# Patient Record
Sex: Female | Born: 1955
Health system: Southern US, Community
[De-identification: ages and names within clinical notes are randomized; demographics above are authoritative.]

## PROBLEM LIST (undated history)

## (undated) DIAGNOSIS — G2581 Restless legs syndrome: Secondary | ICD-10-CM

## (undated) DIAGNOSIS — J439 Emphysema, unspecified: Secondary | ICD-10-CM

## (undated) DIAGNOSIS — F32A Depression, unspecified: Secondary | ICD-10-CM

## (undated) DIAGNOSIS — F429 Obsessive-compulsive disorder, unspecified: Secondary | ICD-10-CM

## (undated) DIAGNOSIS — K219 Gastro-esophageal reflux disease without esophagitis: Secondary | ICD-10-CM

## (undated) DIAGNOSIS — G4730 Sleep apnea, unspecified: Secondary | ICD-10-CM

## (undated) DIAGNOSIS — M1990 Unspecified osteoarthritis, unspecified site: Secondary | ICD-10-CM

## (undated) DIAGNOSIS — H9190 Unspecified hearing loss, unspecified ear: Secondary | ICD-10-CM

## (undated) DIAGNOSIS — J449 Chronic obstructive pulmonary disease, unspecified: Secondary | ICD-10-CM

## (undated) DIAGNOSIS — F419 Anxiety disorder, unspecified: Secondary | ICD-10-CM

## (undated) DIAGNOSIS — T7840XA Allergy, unspecified, initial encounter: Secondary | ICD-10-CM

## (undated) HISTORY — PX: ABDOMINAL HYSTERECTOMY: SHX81

## (undated) HISTORY — PX: BREAST LUMPECTOMY: SHX2

## (undated) HISTORY — DX: Allergy, unspecified, initial encounter: T78.40XA

## (undated) HISTORY — PX: JOINT REPLACEMENT: SHX530

## (undated) HISTORY — DX: Gastro-esophageal reflux disease without esophagitis: K21.9

## (undated) HISTORY — PX: FRACTURE SURGERY: SHX138

## (undated) HISTORY — DX: Unspecified osteoarthritis, unspecified site: M19.90

## (undated) HISTORY — DX: Unspecified hearing loss, unspecified ear: H91.90

## (undated) HISTORY — DX: Sleep apnea, unspecified: G47.30

## (undated) HISTORY — DX: Emphysema, unspecified: J43.9

---

## 1978-08-23 HISTORY — PX: BREAST EXCISIONAL BIOPSY: SUR124

## 2019-09-13 DIAGNOSIS — M549 Dorsalgia, unspecified: Secondary | ICD-10-CM | POA: Diagnosis not present

## 2019-09-13 DIAGNOSIS — M545 Low back pain: Secondary | ICD-10-CM | POA: Diagnosis not present

## 2019-09-18 DIAGNOSIS — G4733 Obstructive sleep apnea (adult) (pediatric): Secondary | ICD-10-CM | POA: Diagnosis not present

## 2019-10-17 DIAGNOSIS — G4733 Obstructive sleep apnea (adult) (pediatric): Secondary | ICD-10-CM | POA: Diagnosis not present

## 2019-10-28 DIAGNOSIS — Z23 Encounter for immunization: Secondary | ICD-10-CM | POA: Diagnosis not present

## 2019-11-18 DIAGNOSIS — Z23 Encounter for immunization: Secondary | ICD-10-CM | POA: Diagnosis not present

## 2020-03-25 DIAGNOSIS — M25561 Pain in right knee: Secondary | ICD-10-CM | POA: Diagnosis not present

## 2020-03-25 DIAGNOSIS — S83411A Sprain of medial collateral ligament of right knee, initial encounter: Secondary | ICD-10-CM | POA: Diagnosis not present

## 2020-04-18 DIAGNOSIS — J449 Chronic obstructive pulmonary disease, unspecified: Secondary | ICD-10-CM | POA: Diagnosis not present

## 2020-04-18 DIAGNOSIS — D649 Anemia, unspecified: Secondary | ICD-10-CM | POA: Diagnosis not present

## 2020-04-18 DIAGNOSIS — G2581 Restless legs syndrome: Secondary | ICD-10-CM | POA: Diagnosis not present

## 2020-04-18 DIAGNOSIS — G4733 Obstructive sleep apnea (adult) (pediatric): Secondary | ICD-10-CM | POA: Diagnosis not present

## 2020-04-18 DIAGNOSIS — R0602 Shortness of breath: Secondary | ICD-10-CM | POA: Diagnosis not present

## 2020-04-25 DIAGNOSIS — J449 Chronic obstructive pulmonary disease, unspecified: Secondary | ICD-10-CM | POA: Diagnosis not present

## 2020-04-25 DIAGNOSIS — Z87891 Personal history of nicotine dependence: Secondary | ICD-10-CM | POA: Diagnosis not present

## 2020-04-25 DIAGNOSIS — R918 Other nonspecific abnormal finding of lung field: Secondary | ICD-10-CM | POA: Diagnosis not present

## 2020-04-25 DIAGNOSIS — Z122 Encounter for screening for malignant neoplasm of respiratory organs: Secondary | ICD-10-CM | POA: Diagnosis not present

## 2020-05-28 DIAGNOSIS — S83411D Sprain of medial collateral ligament of right knee, subsequent encounter: Secondary | ICD-10-CM | POA: Diagnosis not present

## 2020-05-30 DIAGNOSIS — F17200 Nicotine dependence, unspecified, uncomplicated: Secondary | ICD-10-CM | POA: Diagnosis not present

## 2020-05-30 DIAGNOSIS — R911 Solitary pulmonary nodule: Secondary | ICD-10-CM | POA: Diagnosis not present

## 2020-05-30 DIAGNOSIS — G4733 Obstructive sleep apnea (adult) (pediatric): Secondary | ICD-10-CM | POA: Diagnosis not present

## 2020-05-30 DIAGNOSIS — Z23 Encounter for immunization: Secondary | ICD-10-CM | POA: Diagnosis not present

## 2020-05-30 DIAGNOSIS — J449 Chronic obstructive pulmonary disease, unspecified: Secondary | ICD-10-CM | POA: Diagnosis not present

## 2020-06-23 DIAGNOSIS — M25511 Pain in right shoulder: Secondary | ICD-10-CM | POA: Diagnosis not present

## 2020-06-23 DIAGNOSIS — M19011 Primary osteoarthritis, right shoulder: Secondary | ICD-10-CM | POA: Diagnosis not present

## 2020-06-23 DIAGNOSIS — M25512 Pain in left shoulder: Secondary | ICD-10-CM | POA: Diagnosis not present

## 2020-06-23 DIAGNOSIS — M19012 Primary osteoarthritis, left shoulder: Secondary | ICD-10-CM | POA: Diagnosis not present

## 2020-06-25 DIAGNOSIS — F419 Anxiety disorder, unspecified: Secondary | ICD-10-CM | POA: Diagnosis not present

## 2020-06-25 DIAGNOSIS — G2581 Restless legs syndrome: Secondary | ICD-10-CM | POA: Diagnosis not present

## 2020-06-25 DIAGNOSIS — Z23 Encounter for immunization: Secondary | ICD-10-CM | POA: Diagnosis not present

## 2020-06-25 DIAGNOSIS — E538 Deficiency of other specified B group vitamins: Secondary | ICD-10-CM | POA: Diagnosis not present

## 2020-06-25 DIAGNOSIS — E785 Hyperlipidemia, unspecified: Secondary | ICD-10-CM | POA: Diagnosis not present

## 2020-07-10 DIAGNOSIS — E538 Deficiency of other specified B group vitamins: Secondary | ICD-10-CM | POA: Diagnosis not present

## 2020-07-22 DIAGNOSIS — M19012 Primary osteoarthritis, left shoulder: Secondary | ICD-10-CM | POA: Diagnosis not present

## 2020-07-22 DIAGNOSIS — G8918 Other acute postprocedural pain: Secondary | ICD-10-CM | POA: Diagnosis not present

## 2020-07-31 DIAGNOSIS — R509 Fever, unspecified: Secondary | ICD-10-CM | POA: Diagnosis not present

## 2020-07-31 DIAGNOSIS — J441 Chronic obstructive pulmonary disease with (acute) exacerbation: Secondary | ICD-10-CM | POA: Diagnosis not present

## 2020-07-31 DIAGNOSIS — R0981 Nasal congestion: Secondary | ICD-10-CM | POA: Diagnosis not present

## 2020-08-06 DIAGNOSIS — M19012 Primary osteoarthritis, left shoulder: Secondary | ICD-10-CM | POA: Diagnosis not present

## 2020-08-08 DIAGNOSIS — J069 Acute upper respiratory infection, unspecified: Secondary | ICD-10-CM | POA: Diagnosis not present

## 2020-08-11 DIAGNOSIS — J329 Chronic sinusitis, unspecified: Secondary | ICD-10-CM | POA: Diagnosis not present

## 2020-09-04 DIAGNOSIS — Z1231 Encounter for screening mammogram for malignant neoplasm of breast: Secondary | ICD-10-CM | POA: Diagnosis not present

## 2020-09-10 DIAGNOSIS — M25512 Pain in left shoulder: Secondary | ICD-10-CM | POA: Diagnosis not present

## 2020-09-10 DIAGNOSIS — M5451 Vertebrogenic low back pain: Secondary | ICD-10-CM | POA: Diagnosis not present

## 2020-09-10 DIAGNOSIS — M25612 Stiffness of left shoulder, not elsewhere classified: Secondary | ICD-10-CM | POA: Diagnosis not present

## 2020-09-10 DIAGNOSIS — M9903 Segmental and somatic dysfunction of lumbar region: Secondary | ICD-10-CM | POA: Diagnosis not present

## 2020-09-10 DIAGNOSIS — M19012 Primary osteoarthritis, left shoulder: Secondary | ICD-10-CM | POA: Diagnosis not present

## 2020-09-10 DIAGNOSIS — M62830 Muscle spasm of back: Secondary | ICD-10-CM | POA: Diagnosis not present

## 2020-09-10 DIAGNOSIS — M5432 Sciatica, left side: Secondary | ICD-10-CM | POA: Diagnosis not present

## 2020-09-11 DIAGNOSIS — M5432 Sciatica, left side: Secondary | ICD-10-CM | POA: Diagnosis not present

## 2020-09-11 DIAGNOSIS — M9903 Segmental and somatic dysfunction of lumbar region: Secondary | ICD-10-CM | POA: Diagnosis not present

## 2020-09-11 DIAGNOSIS — M5451 Vertebrogenic low back pain: Secondary | ICD-10-CM | POA: Diagnosis not present

## 2020-09-11 DIAGNOSIS — M62830 Muscle spasm of back: Secondary | ICD-10-CM | POA: Diagnosis not present

## 2020-09-12 DIAGNOSIS — M25612 Stiffness of left shoulder, not elsewhere classified: Secondary | ICD-10-CM | POA: Diagnosis not present

## 2020-09-12 DIAGNOSIS — M25512 Pain in left shoulder: Secondary | ICD-10-CM | POA: Diagnosis not present

## 2020-09-12 DIAGNOSIS — M19012 Primary osteoarthritis, left shoulder: Secondary | ICD-10-CM | POA: Diagnosis not present

## 2020-09-15 DIAGNOSIS — M545 Low back pain, unspecified: Secondary | ICD-10-CM | POA: Diagnosis not present

## 2020-09-18 DIAGNOSIS — M25612 Stiffness of left shoulder, not elsewhere classified: Secondary | ICD-10-CM | POA: Diagnosis not present

## 2020-09-18 DIAGNOSIS — M19012 Primary osteoarthritis, left shoulder: Secondary | ICD-10-CM | POA: Diagnosis not present

## 2020-09-18 DIAGNOSIS — M25512 Pain in left shoulder: Secondary | ICD-10-CM | POA: Diagnosis not present

## 2020-09-19 DIAGNOSIS — F17200 Nicotine dependence, unspecified, uncomplicated: Secondary | ICD-10-CM | POA: Diagnosis not present

## 2020-09-19 DIAGNOSIS — G4733 Obstructive sleep apnea (adult) (pediatric): Secondary | ICD-10-CM | POA: Diagnosis not present

## 2020-09-19 DIAGNOSIS — R918 Other nonspecific abnormal finding of lung field: Secondary | ICD-10-CM | POA: Diagnosis not present

## 2020-09-19 DIAGNOSIS — J449 Chronic obstructive pulmonary disease, unspecified: Secondary | ICD-10-CM | POA: Diagnosis not present

## 2020-09-19 DIAGNOSIS — R911 Solitary pulmonary nodule: Secondary | ICD-10-CM | POA: Diagnosis not present

## 2020-09-23 DIAGNOSIS — M25612 Stiffness of left shoulder, not elsewhere classified: Secondary | ICD-10-CM | POA: Diagnosis not present

## 2020-09-23 DIAGNOSIS — M25512 Pain in left shoulder: Secondary | ICD-10-CM | POA: Diagnosis not present

## 2020-09-23 DIAGNOSIS — M19012 Primary osteoarthritis, left shoulder: Secondary | ICD-10-CM | POA: Diagnosis not present

## 2020-09-30 DIAGNOSIS — M25612 Stiffness of left shoulder, not elsewhere classified: Secondary | ICD-10-CM | POA: Diagnosis not present

## 2020-09-30 DIAGNOSIS — M25512 Pain in left shoulder: Secondary | ICD-10-CM | POA: Diagnosis not present

## 2020-10-02 DIAGNOSIS — J449 Chronic obstructive pulmonary disease, unspecified: Secondary | ICD-10-CM | POA: Diagnosis not present

## 2020-10-02 DIAGNOSIS — G2581 Restless legs syndrome: Secondary | ICD-10-CM | POA: Diagnosis not present

## 2020-10-02 DIAGNOSIS — E611 Iron deficiency: Secondary | ICD-10-CM | POA: Diagnosis not present

## 2020-10-02 DIAGNOSIS — Z87891 Personal history of nicotine dependence: Secondary | ICD-10-CM | POA: Diagnosis not present

## 2020-10-07 DIAGNOSIS — M25512 Pain in left shoulder: Secondary | ICD-10-CM | POA: Diagnosis not present

## 2020-10-07 DIAGNOSIS — M25612 Stiffness of left shoulder, not elsewhere classified: Secondary | ICD-10-CM | POA: Diagnosis not present

## 2020-10-07 DIAGNOSIS — M19012 Primary osteoarthritis, left shoulder: Secondary | ICD-10-CM | POA: Diagnosis not present

## 2020-10-14 DIAGNOSIS — M19012 Primary osteoarthritis, left shoulder: Secondary | ICD-10-CM | POA: Diagnosis not present

## 2020-10-14 DIAGNOSIS — M25512 Pain in left shoulder: Secondary | ICD-10-CM | POA: Diagnosis not present

## 2020-10-14 DIAGNOSIS — M25612 Stiffness of left shoulder, not elsewhere classified: Secondary | ICD-10-CM | POA: Diagnosis not present

## 2020-10-16 DIAGNOSIS — M25612 Stiffness of left shoulder, not elsewhere classified: Secondary | ICD-10-CM | POA: Diagnosis not present

## 2020-10-16 DIAGNOSIS — M25512 Pain in left shoulder: Secondary | ICD-10-CM | POA: Diagnosis not present

## 2020-10-31 DIAGNOSIS — M19019 Primary osteoarthritis, unspecified shoulder: Secondary | ICD-10-CM | POA: Diagnosis not present

## 2020-10-31 DIAGNOSIS — M19012 Primary osteoarthritis, left shoulder: Secondary | ICD-10-CM | POA: Diagnosis not present

## 2020-11-26 ENCOUNTER — Other Ambulatory Visit: Payer: Self-pay

## 2020-11-26 DIAGNOSIS — R0781 Pleurodynia: Secondary | ICD-10-CM | POA: Diagnosis not present

## 2020-11-26 DIAGNOSIS — S50311A Abrasion of right elbow, initial encounter: Secondary | ICD-10-CM | POA: Insufficient documentation

## 2020-11-26 DIAGNOSIS — Y92019 Unspecified place in single-family (private) house as the place of occurrence of the external cause: Secondary | ICD-10-CM | POA: Diagnosis not present

## 2020-11-26 DIAGNOSIS — J449 Chronic obstructive pulmonary disease, unspecified: Secondary | ICD-10-CM | POA: Insufficient documentation

## 2020-11-26 DIAGNOSIS — Z96612 Presence of left artificial shoulder joint: Secondary | ICD-10-CM | POA: Diagnosis not present

## 2020-11-26 DIAGNOSIS — W108XXA Fall (on) (from) other stairs and steps, initial encounter: Secondary | ICD-10-CM | POA: Insufficient documentation

## 2020-11-26 DIAGNOSIS — M25561 Pain in right knee: Secondary | ICD-10-CM | POA: Diagnosis not present

## 2020-11-26 DIAGNOSIS — Z23 Encounter for immunization: Secondary | ICD-10-CM | POA: Insufficient documentation

## 2020-11-26 DIAGNOSIS — S299XXA Unspecified injury of thorax, initial encounter: Secondary | ICD-10-CM | POA: Diagnosis not present

## 2020-11-26 DIAGNOSIS — S20211A Contusion of right front wall of thorax, initial encounter: Secondary | ICD-10-CM | POA: Diagnosis not present

## 2020-11-26 DIAGNOSIS — S5001XA Contusion of right elbow, initial encounter: Secondary | ICD-10-CM | POA: Diagnosis not present

## 2020-11-26 DIAGNOSIS — M25521 Pain in right elbow: Secondary | ICD-10-CM | POA: Diagnosis not present

## 2020-11-27 ENCOUNTER — Emergency Department (HOSPITAL_COMMUNITY): Payer: BC Managed Care – PPO

## 2020-11-27 ENCOUNTER — Other Ambulatory Visit: Payer: Self-pay

## 2020-11-27 ENCOUNTER — Emergency Department (HOSPITAL_COMMUNITY)
Admission: EM | Admit: 2020-11-27 | Discharge: 2020-11-27 | Disposition: A | Discharge status: Home / Self Care | Payer: BC Managed Care – PPO | Attending: Emergency Medicine | Admitting: Emergency Medicine

## 2020-11-27 ENCOUNTER — Encounter (HOSPITAL_COMMUNITY): Payer: Self-pay | Admitting: Emergency Medicine

## 2020-11-27 DIAGNOSIS — S20211A Contusion of right front wall of thorax, initial encounter: Secondary | ICD-10-CM | POA: Diagnosis not present

## 2020-11-27 DIAGNOSIS — R0781 Pleurodynia: Secondary | ICD-10-CM | POA: Diagnosis not present

## 2020-11-27 DIAGNOSIS — M25521 Pain in right elbow: Secondary | ICD-10-CM | POA: Diagnosis not present

## 2020-11-27 DIAGNOSIS — S50311A Abrasion of right elbow, initial encounter: Secondary | ICD-10-CM | POA: Diagnosis not present

## 2020-11-27 DIAGNOSIS — S5001XA Contusion of right elbow, initial encounter: Secondary | ICD-10-CM | POA: Diagnosis not present

## 2020-11-27 DIAGNOSIS — W108XXA Fall (on) (from) other stairs and steps, initial encounter: Secondary | ICD-10-CM

## 2020-11-27 HISTORY — DX: Obsessive-compulsive disorder, unspecified: F42.9

## 2020-11-27 HISTORY — DX: Chronic obstructive pulmonary disease, unspecified: J44.9

## 2020-11-27 HISTORY — DX: Restless legs syndrome: G25.81

## 2020-11-27 IMAGING — DX DG ELBOW COMPLETE 3+V*R*
4 series · 4 of 4 positions shown · non-contrast
Comparison: None.

CLINICAL DATA: Fall, right elbow pain

EXAM:
RIGHT ELBOW - COMPLETE 3+ VIEW

[elbow ap]
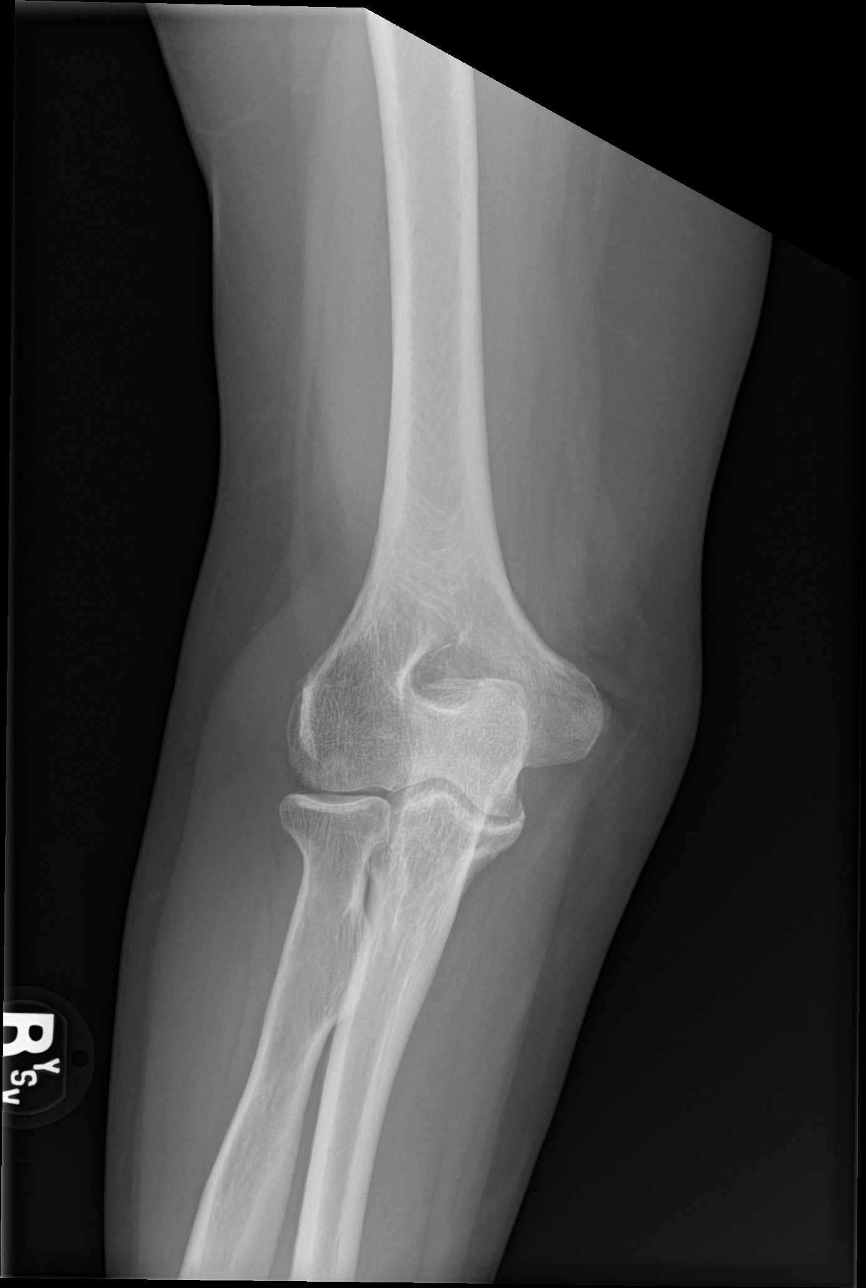

[elbow obl (1 of 2)]
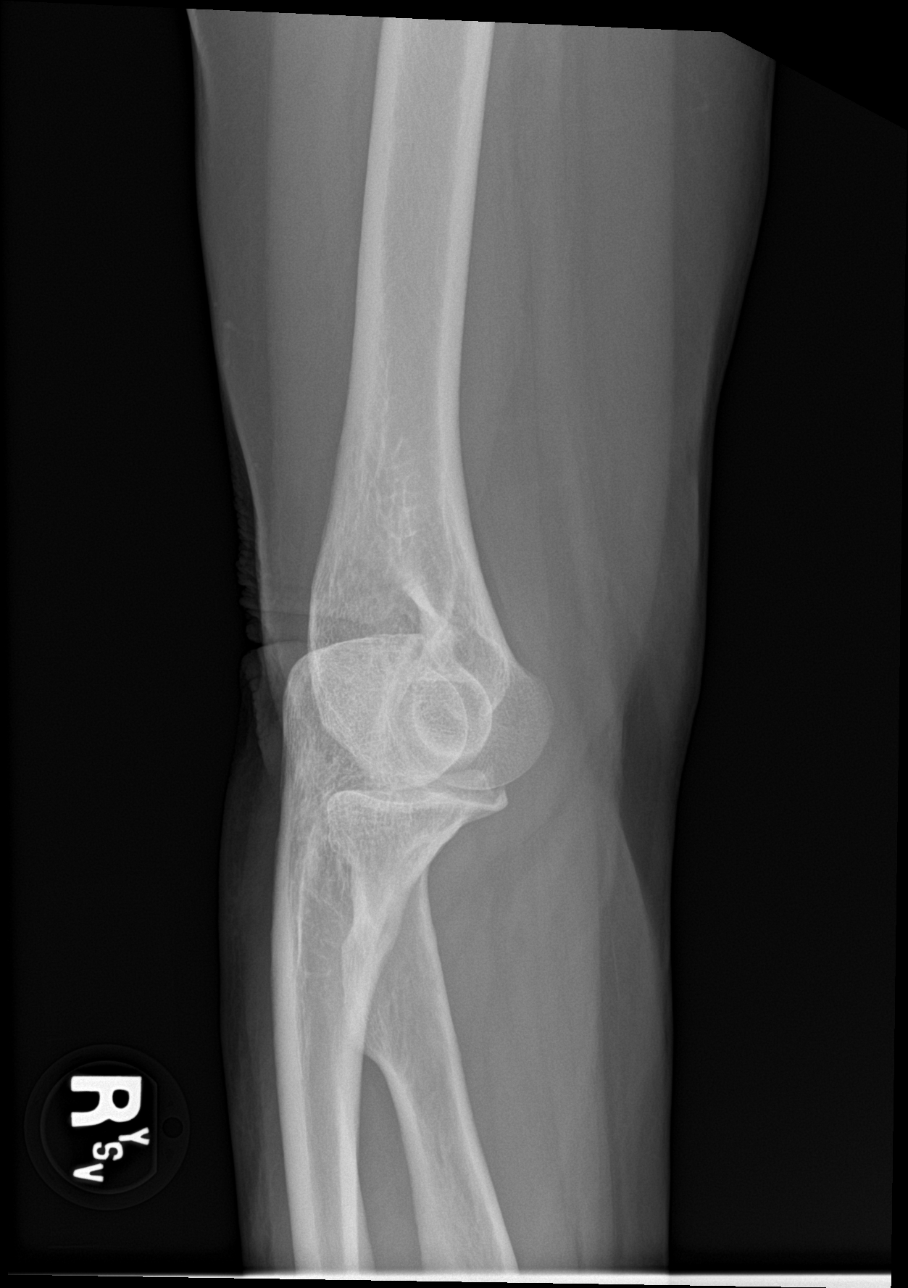

[elbow obl (2 of 2)]
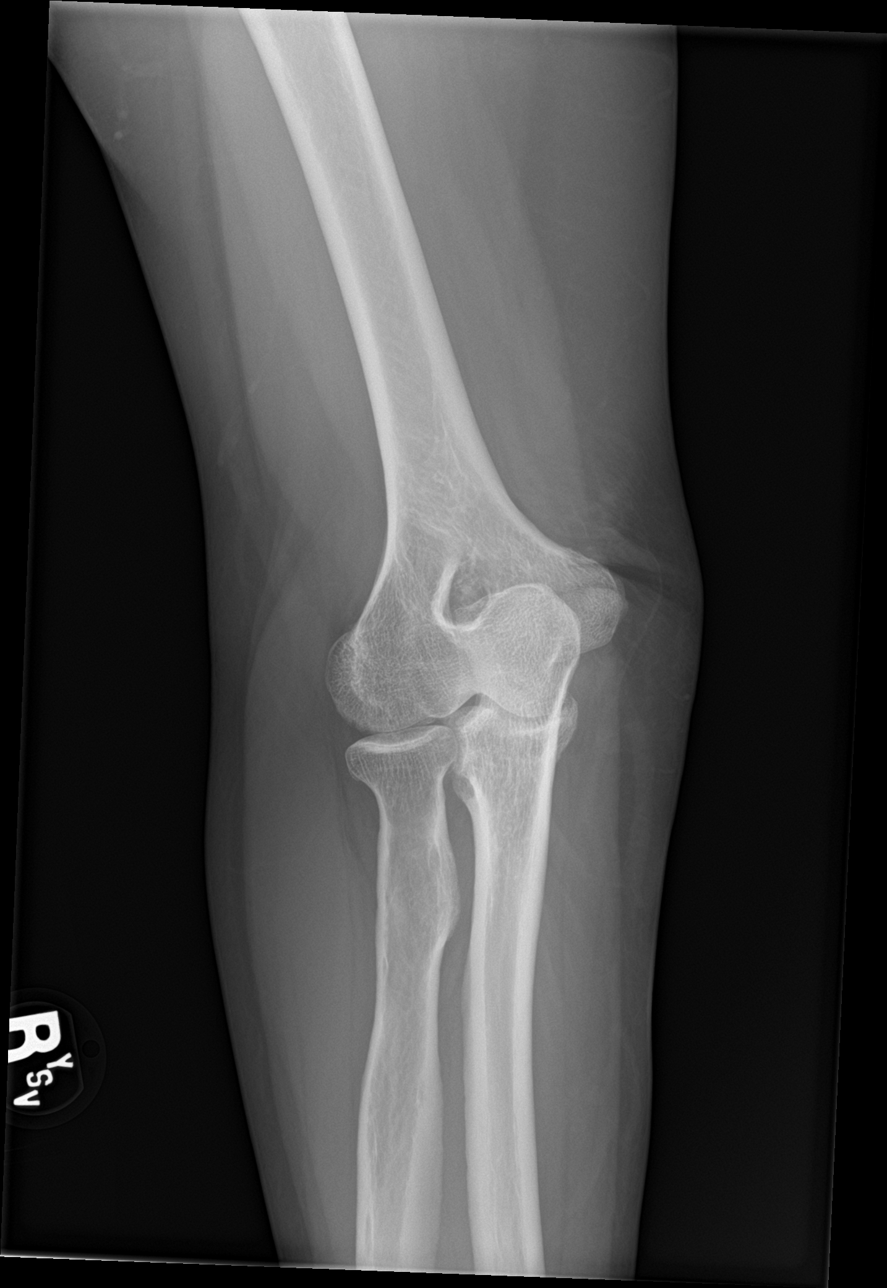

[elbow lat]
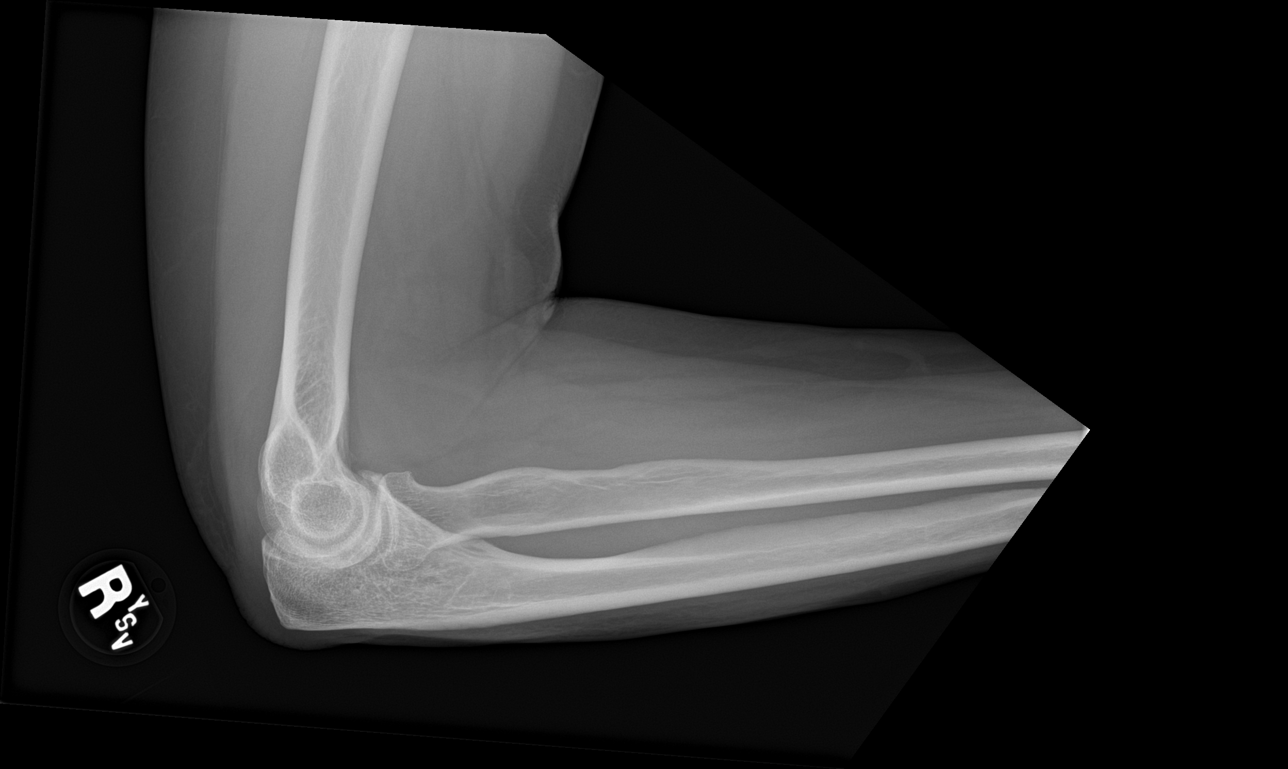

[4 of 4 positions shown; findings below may reference images not displayed]

FINDINGS: There is no evidence of fracture, dislocation, or joint effusion.
There is no evidence of arthropathy or other focal bone abnormality.
Soft tissues are unremarkable.
IMPRESSION: Negative.

## 2020-11-27 IMAGING — DX DG RIBS W/ CHEST 3+V*R*
5 series · 5 of 5 positions shown · non-contrast
Comparison: None.

CLINICAL DATA: Fall down stairs.  Right rib pain

EXAM:
RIGHT RIBS AND CHEST - 3+ VIEW

[chest pa]
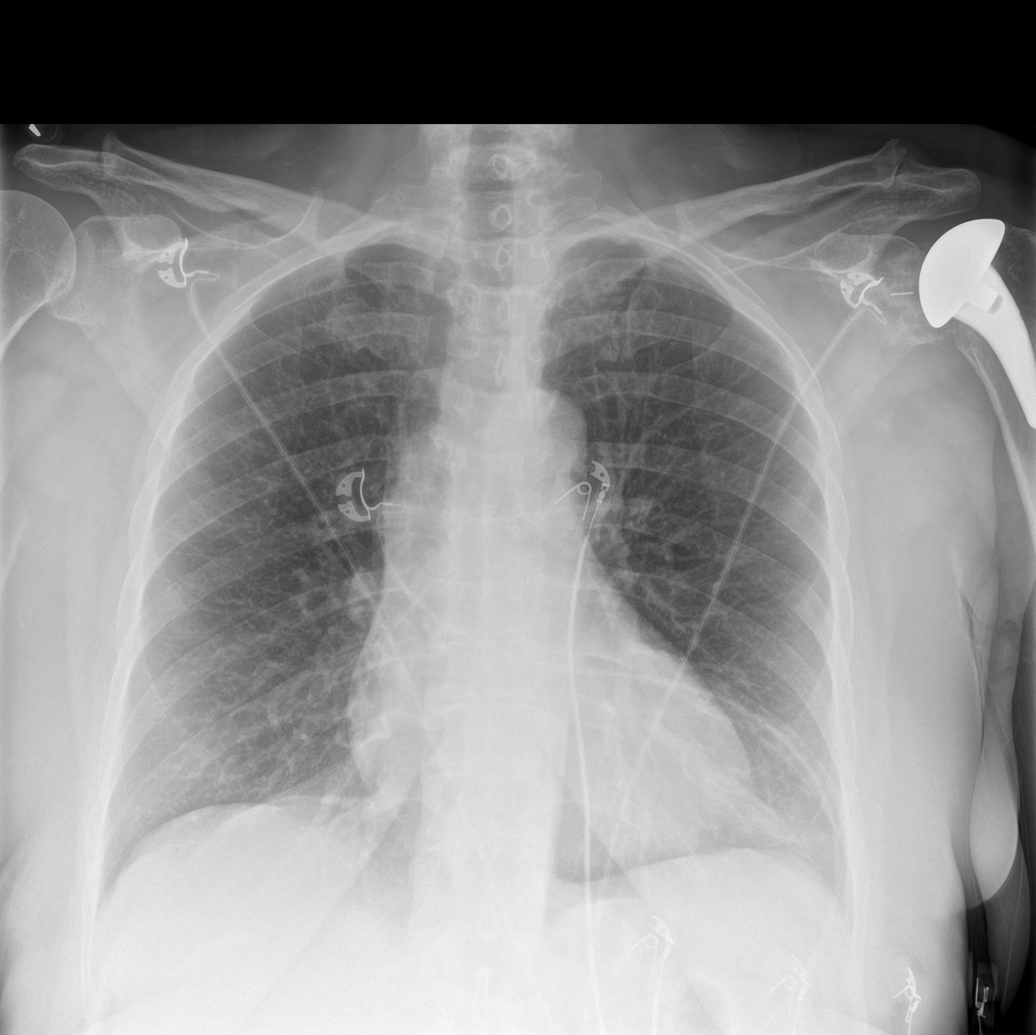

[rib pa]
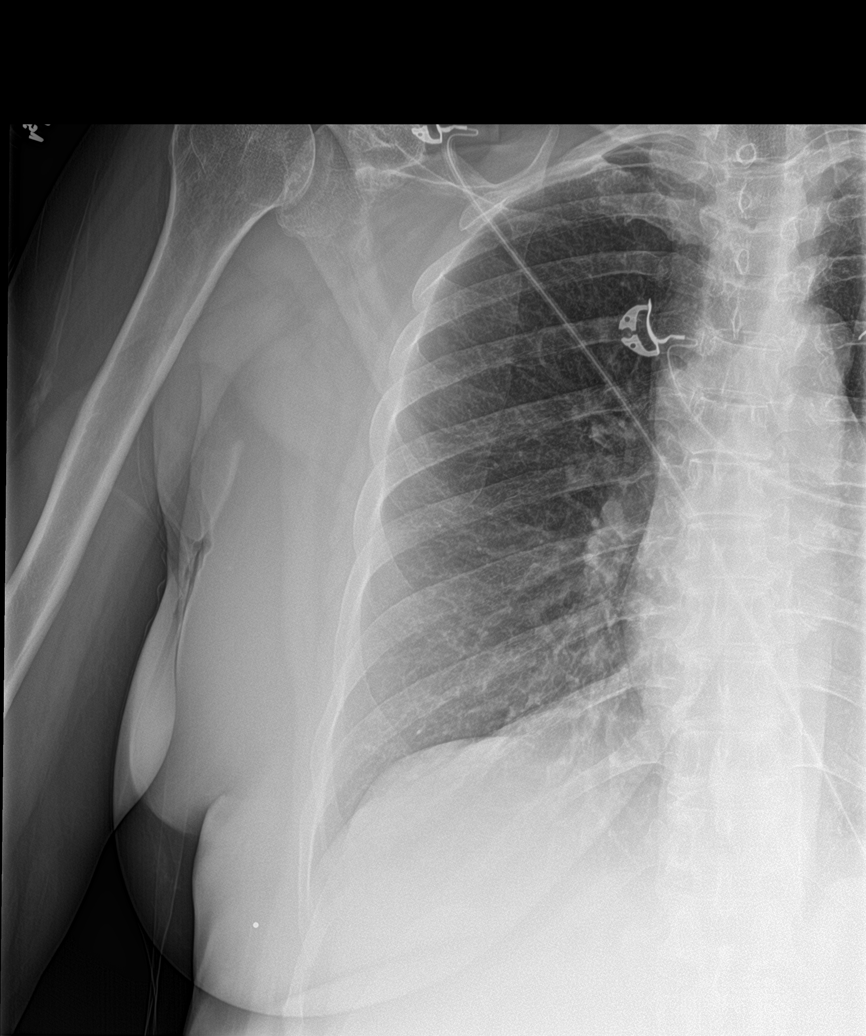

[rib pa obl (1 of 3)]
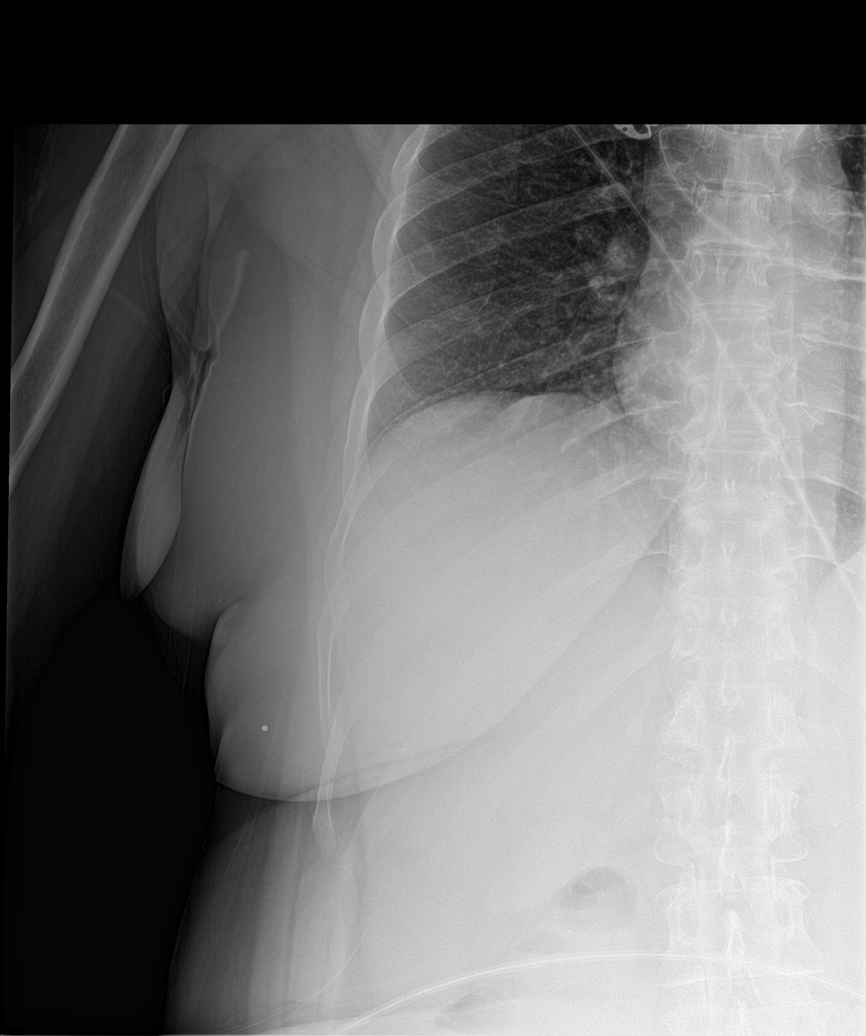

[rib pa obl (2 of 3)]
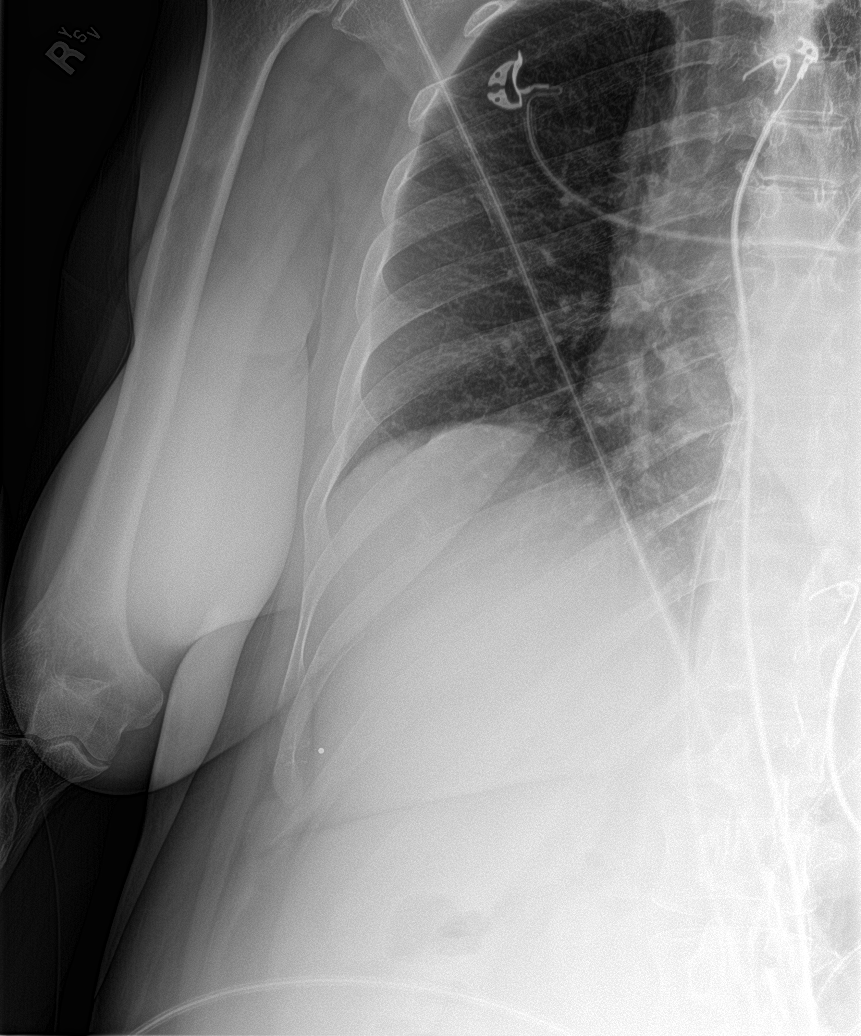

[rib pa obl (3 of 3)]
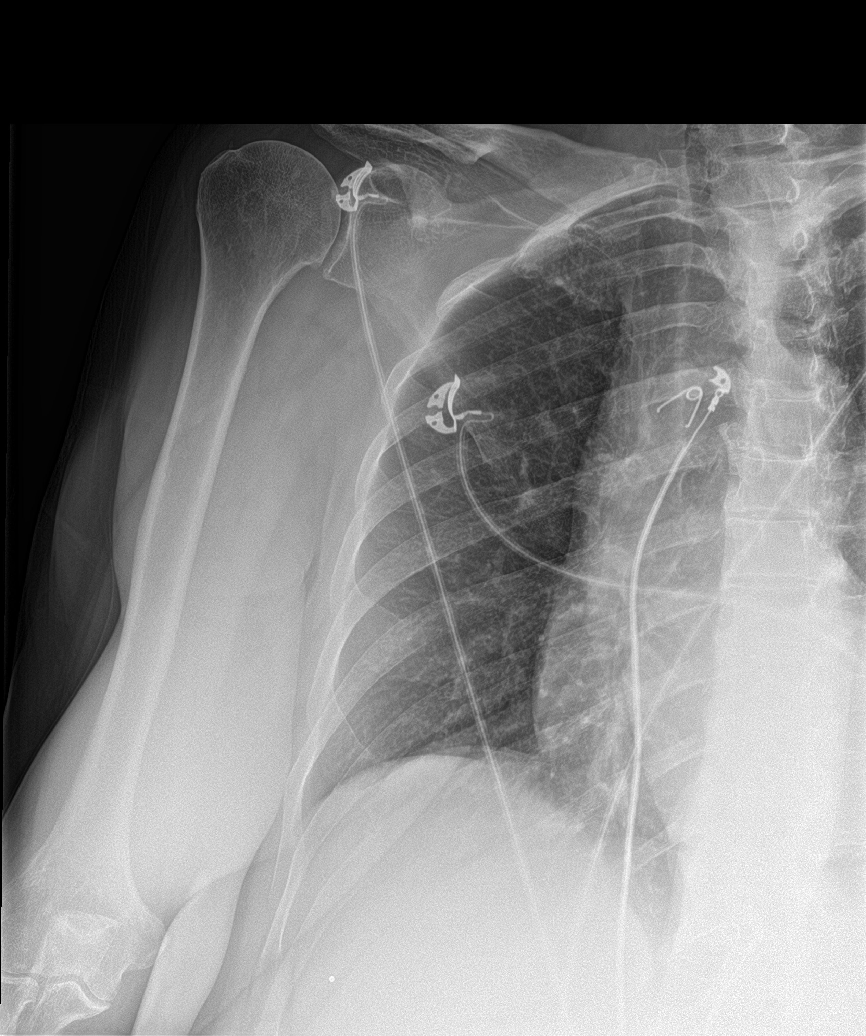

[5 of 5 positions shown; findings below may reference images not displayed]

FINDINGS: No visible displaced rib fracture. Lungs clear. No effusion or
pneumothorax. Heart is normal size.
IMPRESSION: No acute cardiopulmonary disease.

No visible displaced rib fracture.

## 2020-11-27 MED ORDER — TETANUS-DIPHTH-ACELL PERTUSSIS 5-2.5-18.5 LF-MCG/0.5 IM SUSY
0.5000 mL | PREFILLED_SYRINGE | Freq: Once | INTRAMUSCULAR | Status: AC
  Administered 2020-11-27: 0.5 mL via INTRAMUSCULAR
  Filled 2020-11-27: qty 0.5

## 2020-11-27 MED ORDER — ACETAMINOPHEN-CODEINE #3 300-30 MG PO TABS: 1.0000 | ORAL_TABLET | Freq: Four times a day (QID) | ORAL | 0 refills | Status: DC | PRN

## 2020-11-27 MED ORDER — ACETAMINOPHEN-CODEINE #3 300-30 MG PO TABS
1.0000 | ORAL_TABLET | Freq: Once | ORAL | Status: AC
Start: 2020-11-27 — End: 2020-11-27
  Administered 2020-11-27: 1 via ORAL
  Filled 2020-11-27: qty 1

## 2020-11-27 MED ORDER — IBUPROFEN 400 MG PO TABS
400.0000 mg | ORAL_TABLET | Freq: Once | ORAL | Status: AC
  Administered 2020-11-27: 400 mg via ORAL
  Filled 2020-11-27: qty 1

## 2020-11-27 NOTE — ED Provider Notes (Signed)
[facility] EMERGENCY DEPARTMENT Provider Note   CSN: [id number] Arrival date & time: 11/26/20  2335   History Chief Complaint  Patient presents with  . Fall    Rib pain    [name] [name] is a 65 y.o. female.  The history is provided by the patient.  Fall  She has history of COPD and comes in after falling down 5 steps.  She is complaining of pain in her right lateral ribs and also states that she scraped her right elbow and right knee.  There is pain on movement of the right elbow but not on movement of the right knee.  She denies head, neck, back injury.  Last tetanus immunization is unknown.  Past Medical History:  Diagnosis Date  . COPD (chronic obstructive pulmonary disease) ([place])   . OCD (obsessive compulsive disorder)   . Restless legs syndrome (RLS)     There are no problems to display for this patient.   Past Surgical History:  Procedure Laterality Date  . ABDOMINAL HYSTERECTOMY    . FRACTURE SURGERY     foot  . JOINT REPLACEMENT     left shoulder     OB History   No obstetric history on file.     No family history on file.  Social History   Tobacco Use  . Smoking status: Never Smoker  . Smokeless tobacco: Never Used  Substance Use Topics  . Drug use: Yes    Types: Marijuana    Home Medications Prior to Admission medications   Not on File    Allergies    Mobic [meloxicam]  Review of Systems   Review of Systems  All other systems reviewed and are negative.   Physical Exam Updated Vital Signs BP (!) 152/74 (BP Location: Left Arm)   Pulse 86   Temp 98.5 F (36.9 C) (Oral)   Resp 20   Ht 5\' 9"  (1.753 m)   Wt 104.3 kg   SpO2 100%   BMI 33.97 kg/m   Physical Exam Vitals and nursing note reviewed.   65 year old female, resting comfortably and in no acute distress. Vital signs are significant for elevated blood pressure. Oxygen saturation is 100%, which is normal. Head is normocephalic and atraumatic. PERRLA, EOMI. Oropharynx is  clear. Neck is nontender and supple without adenopathy or JVD. Back is nontender and there is no CVA tenderness. Lungs are clear without rales, wheezes, or rhonchi. Chest has point tenderness in the right lateral rib cage.  No crepitus is present. Heart has regular rate and rhythm without murmur. Abdomen is soft, flat, nontender without masses or hepatosplenomegaly and peristalsis is normoactive. Pelvis is stable and nontender. Extremities: Abrasion is present over the right olecranon area.  No swelling noted but there is pain on passive range of motion.  Distal neurovascular exam is intact with strong pulses, prompt capillary refill, normal sensation, normal motor function.  No tenderness to palpation of the right knee, no swelling noted, full passive range of motion present without pain.  Full passive range of motion present all other joints without pain. Skin is warm and dry without rash. Neurologic: Mental status is normal, cranial nerves are intact, there are no motor or sensory deficits.  ED Results / Procedures / Treatments    Radiology DG Ribs Unilateral W/Chest Right  Result Date: 11/27/2020 CLINICAL DATA:  Fall down stairs.  Right rib pain EXAM: RIGHT RIBS AND CHEST - 3+ VIEW COMPARISON:  None. FINDINGS: No visible displaced rib fracture.  Lungs clear. No effusion or pneumothorax. Heart is normal size. IMPRESSION: No acute cardiopulmonary disease. No visible displaced rib fracture. Electronically Signed   By: Rolm Baptise M.D.   On: 11/27/2020 01:33   DG Elbow Complete Right  Result Date: 11/27/2020 CLINICAL DATA:  Fall, right elbow pain EXAM: RIGHT ELBOW - COMPLETE 3+ VIEW COMPARISON:  None. FINDINGS: There is no evidence of fracture, dislocation, or joint effusion. There is no evidence of arthropathy or other focal bone abnormality. Soft tissues are unremarkable. IMPRESSION: Negative. Electronically Signed   By: Rolm Baptise M.D.   On: 11/27/2020 01:32    Procedures Procedures    Medications Ordered in ED Medications  ibuprofen (ADVIL) tablet 400 mg (has no administration in time range)  Tdap (BOOSTRIX) injection 0.5 mL (has no administration in time range)    ED Course  I have reviewed the triage vital signs and the nursing notes.  Pertinent imaging results that were available during my care of the patient were reviewed by me and considered in my medical decision making (see chart for details).  MDM Rules/Calculators/A&P Fall with injury to right ribs and abrasion of right elbow.  She is being sent for x-rays.  She has no prior records in the [facility] health system or in [place].  Tdap booster is given.  X-rays show no evidence of fracture.  She is discharged with instructions to apply ice, take over-the-counter analgesics as needed for pain.  Given prescription for small number of acetaminophen with codeine tablets to use as needed for severe pain.  Final Clinical Impression(s) / ED Diagnoses Final diagnoses:  Fall down steps, initial encounter  Contusion of right chest wall, initial encounter  Abrasion of right elbow, initial encounter    Rx / DC Orders ED Discharge Orders         Ordered    acetaminophen-codeine (TYLENOL #3) 300-30 MG tablet  Every 6 hours PRN        11/27/20 3762           Delora Fuel, MD 83/15/17 0157

## 2020-11-27 NOTE — Discharge Instructions (Signed)
Apply ice for 30 minutes at a time, 4 times a day.  You may take acetaminophen and/or ibuprofen as needed for pain.  Please note that combining the 2 gives you better pain relief than either one by itself.

## 2020-11-27 NOTE — ED Triage Notes (Signed)
Pt c/o falling down 5 steps tonight at home. Denies LOC. Pt c/o right rib pain, right knee, right arm/hand, and left hand pain.

## 2021-01-22 DIAGNOSIS — R5383 Other fatigue: Secondary | ICD-10-CM | POA: Diagnosis not present

## 2021-01-22 DIAGNOSIS — U099 Post covid-19 condition, unspecified: Secondary | ICD-10-CM | POA: Diagnosis not present

## 2021-01-30 DIAGNOSIS — U099 Post covid-19 condition, unspecified: Secondary | ICD-10-CM | POA: Diagnosis not present

## 2021-01-30 DIAGNOSIS — J019 Acute sinusitis, unspecified: Secondary | ICD-10-CM | POA: Diagnosis not present

## 2021-01-30 DIAGNOSIS — R058 Other specified cough: Secondary | ICD-10-CM | POA: Diagnosis not present

## 2021-02-08 DIAGNOSIS — J449 Chronic obstructive pulmonary disease, unspecified: Secondary | ICD-10-CM | POA: Diagnosis not present

## 2021-02-08 DIAGNOSIS — J40 Bronchitis, not specified as acute or chronic: Secondary | ICD-10-CM | POA: Diagnosis not present

## 2021-02-18 DIAGNOSIS — U099 Post covid-19 condition, unspecified: Secondary | ICD-10-CM | POA: Diagnosis not present

## 2021-03-11 ENCOUNTER — Other Ambulatory Visit: Payer: Self-pay

## 2021-03-11 ENCOUNTER — Encounter: Payer: Self-pay | Admitting: Family Medicine

## 2021-03-11 ENCOUNTER — Ambulatory Visit (INDEPENDENT_AMBULATORY_CARE_PROVIDER_SITE_OTHER): Payer: BC Managed Care – PPO | Admitting: Family Medicine

## 2021-03-11 ENCOUNTER — Ambulatory Visit (INDEPENDENT_AMBULATORY_CARE_PROVIDER_SITE_OTHER): Payer: BC Managed Care – PPO

## 2021-03-11 VITALS — BP 145/75 | HR 109 | Wt 234.6 lb

## 2021-03-11 DIAGNOSIS — Z0000 Encounter for general adult medical examination without abnormal findings: Secondary | ICD-10-CM

## 2021-03-11 DIAGNOSIS — E559 Vitamin D deficiency, unspecified: Secondary | ICD-10-CM | POA: Diagnosis not present

## 2021-03-11 DIAGNOSIS — Z8669 Personal history of other diseases of the nervous system and sense organs: Secondary | ICD-10-CM

## 2021-03-11 DIAGNOSIS — E612 Magnesium deficiency: Secondary | ICD-10-CM

## 2021-03-11 DIAGNOSIS — G4730 Sleep apnea, unspecified: Secondary | ICD-10-CM | POA: Diagnosis not present

## 2021-03-11 DIAGNOSIS — Z23 Encounter for immunization: Secondary | ICD-10-CM

## 2021-03-11 DIAGNOSIS — Z1159 Encounter for screening for other viral diseases: Secondary | ICD-10-CM

## 2021-03-11 DIAGNOSIS — Z114 Encounter for screening for human immunodeficiency virus [HIV]: Secondary | ICD-10-CM

## 2021-03-11 DIAGNOSIS — Z6834 Body mass index (BMI) 34.0-34.9, adult: Secondary | ICD-10-CM

## 2021-03-11 DIAGNOSIS — Z8639 Personal history of other endocrine, nutritional and metabolic disease: Secondary | ICD-10-CM

## 2021-03-11 MED ORDER — SHINGRIX 50 MCG/0.5ML IM SUSR: 0.5000 mL | Freq: Once | INTRAMUSCULAR | 0 refills | Status: AC

## 2021-03-11 NOTE — Patient Instructions (Signed)
It was a pleasure to see you today!  Thank you for choosing [facility] for your primary care.   [name] [name] was seen for establishing care, blood pressure.   Our plans for today were: Today we will collect blood work to measure your electrolytes, vitamin levels and cholesterol. We will also have you sign a release of records form so that we can contact your prior healthcare provider for records to make sure we have updated recommendations for you. I will print a prescription for you to have a shingles vaccine at your pharmacy.  To keep you healthy, please keep in mind the following health maintenance items that you are due for:   COVID Vaccine   Pap smear  Colonoscopy Shingles Vaccine   You should return to our clinic in 2-3 weeks for blood pressure.   Best Wishes,   Dr. Alba Cory

## 2021-03-11 NOTE — Progress Notes (Signed)
    SUBJECTIVE:   CHIEF COMPLAINT / HPI: establishing care   Past Medical Hx  COVID residual effects  Patietn was diangosed on may 17th and tested positive on 5/19  and returnted to work on  6/27th  She repports taht she would like to get her next booster  Reports she had last colonosopy  states she is due for one but thinks last one was 8 years ago and in [place]  Recently had mammogram in last year, no hx of abnormal  She is planning to have records transferred from previous doctors  Has noticed BP getting a little higher in the last few months. She reports last recording a systolic pressure of 771. She denies a hx of HTN. Hx of vitamin D and B deficient in the past, has been doing the patches since as she has studied that they have better   Past medical history COPD Sleep apnea  GYN history Denies current sexual activity Patient has 2 adult children, 69 daughter and 47 son Last Pap smear was several years ago Patient is not sure if she still needs them  does she need them still?  Surgical history C-section, 1996 Hysterectomy 2006- due to abnormal bleeding  Orthopedic surgery 2021  Social history Patient works full-time Tobacco reports that she smoked previously 10-12 years ago, some marijuana occasionally daily  Alcohol, occasionally in socially denies 6 more or in one setting  Recreational drugs, THC denies others   Current medications include Gabapentin 100 mg nightly Ropinirole 4 mg nightly Citalopram 40 mg daily Spiriva 1.25 mcg 2 puffs daily Advair 230 mcg 2 puffs daily Iron patch, 8 hours/day Vitamin B12 patch, 8 hours a day Calcium, vitamin D and magnesium patches  HM  Patient agreeable to shingles vaccination, HIV and Hep C screening Updated covid vaccination  Signed release of records for updated records to see if patient needs pap smear still after hysterectomy   PERTINENT  PMH / PSH:  COPD  OBJECTIVE:   BP (!) 145/75   Pulse (!) 109   Wt 234  lb 9.6 oz (106.4 kg)   SpO2 95%   BMI 34.64 kg/m   General: female appearing stated age in no acute distress HEENT: MMM, no oral lesions noted,Neck non-tender without lymphadenopathy, masses or thyromegaly Cardio: Normal S1 and S2, no S3 or S4. Rhythm is regular. No murmurs or rubs.  Bilateral radial pulses palpable Pulm: Clear to auscultation bilaterally, no crackles, wheezing, or diminished breath sounds. Normal respiratory effort, stable on RA Abdomen: Bowel sounds normal. Abdomen soft and non-tender. Extremities: No peripheral edema. Warm/ well perfused.  Neuro: pt alert and oriented x4    ASSESSMENT/PLAN:   BMI 34.0-34.9,adult BMI 34.64 BMP  Lipid panel    Vitamin D deficiency Patient reports hx of vitamin d deficiency  Will check vitamin D levels  Pt currently wearing patches   Healthcare maintenance Shingrix vaccine script given  Hep c screening  HIV screening  COVID vaccine given    Hx of sleep apnea Pt reports hx of wearing CPAP machine at night for hx of OSA and needs sleep study  Ref to pulmonology for sleep study   Magnesium deficiency Patient reported hx of low magnesium  Currently wears magnesium patches  Will check magnesium level today    Pt to schedule follow up for BP     [name] Foster, MD [place]

## 2021-03-11 NOTE — Progress Notes (Signed)
   Covid-19 Vaccination Clinic  Name:  [name] [name]    MRN: [id number] DOB: 06-27-1956  03/11/2021  Ms. [name] [name] was observed post Covid-19 immunization for 15 minutes without incident. She was provided with Vaccine Information Sheet and instruction to access the V-Safe system.   Ms. [name] [name] was instructed to call 911 with any severe reactions post vaccine: Difficulty breathing  Swelling of face and throat  A fast heartbeat  A bad rash all over body  Dizziness and weakness

## 2021-03-12 LAB — BASIC METABOLIC PANEL
BUN/Creatinine Ratio: 14 (ref 9–23)
BUN: 11 mg/dL (ref 6–24)
CO2: 24 mmol/L (ref 20–29)
Calcium: 9.1 mg/dL (ref 8.7–10.2)
Chloride: 106 mmol/L (ref 96–106)
Creatinine, Ser: 0.79 mg/dL (ref 0.57–1.00)
Glucose: 105 mg/dL — ABNORMAL HIGH (ref 65–99)
Potassium: 4.3 mmol/L (ref 3.5–5.2)
Sodium: 142 mmol/L (ref 134–144)
eGFR: 90 mL/min/{1.73_m2} (ref >59)

## 2021-03-12 LAB — VITAMIN B12: Vitamin B-12: 191 pg/mL — ABNORMAL LOW (ref 232–1245)

## 2021-03-12 LAB — LIPID PANEL
Chol/HDL Ratio: 5.2 ratio — ABNORMAL HIGH (ref 0.0–4.4)
Cholesterol, Total: 229 mg/dL — ABNORMAL HIGH (ref 100–199)
HDL: 44 mg/dL (ref >39)
LDL Chol Calc (NIH): 148 mg/dL — ABNORMAL HIGH (ref 0–99)
Triglycerides: 206 mg/dL — ABNORMAL HIGH (ref 0–149)
VLDL Cholesterol Cal: 37 mg/dL (ref 5–40)

## 2021-03-12 LAB — MAGNESIUM: Magnesium: 2 mg/dL (ref 1.6–2.3)

## 2021-03-12 LAB — HEPATITIS C ANTIBODY: Hep C Virus Ab: <0.1 s/co ratio (ref 0.0–0.9)

## 2021-03-12 LAB — VITAMIN D 25 HYDROXY (VIT D DEFICIENCY, FRACTURES): Vit D, 25-Hydroxy: 33.7 ng/mL (ref 30.0–100.0)

## 2021-03-12 LAB — HIV ANTIBODY (ROUTINE TESTING W REFLEX): HIV Screen 4th Generation wRfx: NONREACTIVE

## 2021-03-15 ENCOUNTER — Encounter: Payer: Self-pay | Admitting: Family Medicine

## 2021-03-15 DIAGNOSIS — E669 Obesity, unspecified: Secondary | ICD-10-CM | POA: Insufficient documentation

## 2021-03-15 DIAGNOSIS — E559 Vitamin D deficiency, unspecified: Secondary | ICD-10-CM | POA: Insufficient documentation

## 2021-03-15 DIAGNOSIS — Z8669 Personal history of other diseases of the nervous system and sense organs: Secondary | ICD-10-CM | POA: Insufficient documentation

## 2021-03-15 DIAGNOSIS — Z6834 Body mass index (BMI) 34.0-34.9, adult: Secondary | ICD-10-CM | POA: Insufficient documentation

## 2021-03-15 DIAGNOSIS — Z0000 Encounter for general adult medical examination without abnormal findings: Secondary | ICD-10-CM | POA: Insufficient documentation

## 2021-03-15 DIAGNOSIS — E612 Magnesium deficiency: Secondary | ICD-10-CM | POA: Insufficient documentation

## 2021-03-15 NOTE — Assessment & Plan Note (Addendum)
Shingrix vaccine script given  Hep c screening  HIV screening  COVID vaccine given

## 2021-03-15 NOTE — Assessment & Plan Note (Signed)
Patient reports hx of vitamin d deficiency  Will check vitamin D levels  Pt currently wearing patches

## 2021-03-15 NOTE — Assessment & Plan Note (Signed)
Patient reported hx of low magnesium  Currently wears magnesium patches  Will check magnesium level today

## 2021-03-15 NOTE — Assessment & Plan Note (Addendum)
BMI 34.64 BMP  Lipid panel

## 2021-03-15 NOTE — Assessment & Plan Note (Signed)
Pt reports hx of wearing CPAP machine at night for hx of OSA and needs sleep study  Ref to pulmonology for sleep study

## 2021-03-16 DIAGNOSIS — M75101 Unspecified rotator cuff tear or rupture of right shoulder, not specified as traumatic: Secondary | ICD-10-CM | POA: Diagnosis not present

## 2021-03-21 ENCOUNTER — Other Ambulatory Visit: Payer: Self-pay | Admitting: Family Medicine

## 2021-03-21 MED ORDER — ROSUVASTATIN CALCIUM 5 MG PO TABS: 5.0000 mg | ORAL_TABLET | Freq: Every day | ORAL | 3 refills | Status: DC

## 2021-04-23 DIAGNOSIS — M19011 Primary osteoarthritis, right shoulder: Secondary | ICD-10-CM | POA: Diagnosis not present

## 2021-04-23 DIAGNOSIS — M25411 Effusion, right shoulder: Secondary | ICD-10-CM | POA: Diagnosis not present

## 2021-04-23 DIAGNOSIS — M75101 Unspecified rotator cuff tear or rupture of right shoulder, not specified as traumatic: Secondary | ICD-10-CM | POA: Diagnosis not present

## 2021-05-05 ENCOUNTER — Other Ambulatory Visit: Payer: Self-pay

## 2021-05-05 ENCOUNTER — Encounter: Payer: Self-pay | Admitting: Pulmonary Disease

## 2021-05-05 ENCOUNTER — Ambulatory Visit (INDEPENDENT_AMBULATORY_CARE_PROVIDER_SITE_OTHER): Payer: BC Managed Care – PPO | Admitting: Pulmonary Disease

## 2021-05-05 VITALS — BP 130/80 | HR 97 | Ht 69.0 in | Wt 232.0 lb

## 2021-05-05 DIAGNOSIS — G4733 Obstructive sleep apnea (adult) (pediatric): Secondary | ICD-10-CM | POA: Diagnosis not present

## 2021-05-05 DIAGNOSIS — R0602 Shortness of breath: Secondary | ICD-10-CM

## 2021-05-05 MED ORDER — SPIRIVA RESPIMAT 2.5 MCG/ACT IN AERS: 2.0000 | INHALATION_SPRAY | Freq: Every day | RESPIRATORY_TRACT | 5 refills | Status: DC

## 2021-05-05 MED ORDER — GABAPENTIN 300 MG PO CAPS: 300.0000 mg | ORAL_CAPSULE | Freq: Every day | ORAL | 3 refills | Status: DC

## 2021-05-05 MED ORDER — FLUTICASONE-SALMETEROL 230-21 MCG/ACT IN AERO: 2.0000 | INHALATION_SPRAY | Freq: Two times a day (BID) | RESPIRATORY_TRACT | 3 refills | Status: DC

## 2021-05-05 MED ORDER — ROPINIROLE HCL 4 MG PO TABS: 4.0000 mg | ORAL_TABLET | Freq: Every day | ORAL | 3 refills | Status: DC

## 2021-05-05 NOTE — Patient Instructions (Signed)
Obstructive sleep apnea -Try and use your CPAP on a regular basis -We will try and get a copy of your study from your previous doctor  Chronic obstructive pulmonary disease -Prescription for Advair, Spiriva, sent into pharmacy  Restless legs -Prescription for Requip 4 mg -Prescription for Neurontin 300 mg nightly-take about 3- 4 hours before bedtime  Call with significant concerns  Will see you in about 6 weeks  No changes to your CPAP at present we will try and get a copy of the results and consider referral to be evaluated for an inspire device

## 2021-05-05 NOTE — Progress Notes (Signed)
[name] [name]    UF:[id number]    1956/04/06  Primary Care Physician:Simmons-Robinson, Riki Sheer, MD  Referring Physician: Zenia Resides, MD [address] [place],  [place] [address]  Chief complaint:   Patient is being evaluated for COPD, obstructive sleep apnea  HPI:  Notes with COPD about 2017 -On Advair, Spiriva -As needed albuterol  Restless leg syndrome -On Requip -On gabapentin 100 mg -Symptoms remain uncontrolled  Obstructive sleep apnea was diagnosed in 2017 Has not been able to get used to using CPAP on a regular basis Takes it off a lot at night She feels restless legs left uncontrolled is contributing to her not being able to sleep well at night She does have some issues with claustrophobia  She has had multiple mask changes amphetamines but has not still been able to get used to using it nightly  When she is able to keep it on through the night, she does feel better in the morning  Recent CT scan of the chest for lung nodules-stable  Quit smoking about 2010  No pertinent occupational history   Outpatient Encounter Medications as of 05/05/2021  Medication Sig   acetaminophen-codeine (TYLENOL #3) 300-30 MG tablet Take 1 tablet by mouth every 6 (six) hours as needed for moderate pain.   citalopram (CELEXA) 40 MG tablet Take 40 mg by mouth daily.   fluticasone-salmeterol (ADVAIR HFA) 230-21 MCG/ACT inhaler Inhale 2 puffs into the lungs 2 (two) times daily.   gabapentin (NEURONTIN) 100 MG capsule Take 100 mg by mouth at bedtime.   omeprazole (PRILOSEC) 20 MG capsule Take by mouth.   rOPINIRole (REQUIP) 4 MG tablet Take 4 mg by mouth at bedtime.   rosuvastatin (CRESTOR) 5 MG tablet Take 1 tablet (5 mg total) by mouth daily.   tiotropium (SPIRIVA) 18 MCG inhalation capsule Place 1.25 mcg into inhaler and inhale daily. 2 puffs daily    [DISCONTINUED] amoxicillin-clavulanate (AUGMENTIN) 875-125 MG tablet SMARTSIG:1 Tablet(s) By Mouth Every 12 Hours  (Patient not taking: Reported on 05/05/2021)   [DISCONTINUED] azithromycin (ZITHROMAX) 250 MG tablet Take 250 mg by mouth as directed. (Patient not taking: Reported on 05/05/2021)   [DISCONTINUED] dexamethasone (DECADRON) 6 MG tablet Take 6 mg by mouth daily.   [DISCONTINUED] nirmatrelvir & ritonavir (PAXLOVID) 20 x 150 MG & 10 x '100MG'$  TBPK Take by mouth.   [DISCONTINUED] predniSONE (DELTASONE) 20 MG tablet Take 20 mg by mouth 2 (two) times daily.   No facility-administered encounter medications on file as of 05/05/2021.    Allergies as of 05/05/2021 - Review Complete 05/05/2021  Allergen Reaction Noted   Buspirone Anxiety 08/08/2020   Mobic [meloxicam] Other (See Comments) 11/27/2020    Past Medical History:  Diagnosis Date   COPD (chronic obstructive pulmonary disease) ([facility])    OCD (obsessive compulsive disorder)    Restless legs syndrome (RLS)    Sleep apnea     Past Surgical History:  Procedure Laterality Date   ABDOMINAL HYSTERECTOMY     CESAREAN SECTION  1996   FRACTURE SURGERY     foot   JOINT REPLACEMENT     left shoulder    No family history on file.  Social History   Socioeconomic History   Marital status: Single    Spouse name: Not on file   Number of children: Not on file   Years of education: Not on file   Highest education level: Not on file  Occupational History   Not on  file  Tobacco Use   Smoking status: Never   Smokeless tobacco: Never  Substance and Sexual Activity   Alcohol use: Not on file   Drug use: Yes    Types: Marijuana   Sexual activity: Not on file  Other Topics Concern   Not on file  Social History Narrative   Not on file   Social Determinants of Health   Financial Resource Strain: Not on file  Food Insecurity: Not on file  Transportation Needs: Not on file  Physical Activity: Not on file  Stress: Not on file  Social Connections: Not on file  Intimate Partner Violence: Not on file    Review of Systems  Constitutional:   Positive for fatigue.  Respiratory:  Positive for apnea.   Psychiatric/Behavioral:  Positive for sleep disturbance.    Vitals:   05/05/21 1343  BP: 130/80  Pulse: 97  SpO2: 97%     Physical Exam Constitutional:      Appearance: She is obese.  HENT:     Head: Normocephalic.     Nose: No congestion.     Mouth/Throat:     Mouth: Mucous membranes are moist.  Eyes:     Pupils: Pupils are equal, round, and reactive to light.  Cardiovascular:     Rate and Rhythm: Normal rate and regular rhythm.     Pulses: Normal pulses.     Heart sounds: No murmur heard.   No friction rub.  Pulmonary:     Effort: No respiratory distress.     Breath sounds: No stridor. No wheezing or rhonchi.  Musculoskeletal:     Cervical back: No rigidity or tenderness.  Neurological:     Mental Status: She is alert.  Psychiatric:        Mood and Affect: Mood normal.     Data Reviewed: Previous study not available  Assessment:  Obstructive sleep apnea -Intolerant of CPAP at present -We will obtain records to assess severity of obstructive sleep apnea and pressure settings  Chronic obstructive pulmonary disease -Prescription for Advair, Spiriva sent into pharmacy  Restless legs -Currently on Requip 4 mg -This does not seem to be helping at all -Not sleeping well -We will switch to Neurontin 300 mg nightly  As she has not been using CPAP on a regular basis Encouraged to use CPAP on a regular basis and following reviewing results changes may need made  Encouraged to start using it nightly so that we can get some download to see what may be the significant problem  Did discuss possibility of referral for an inspire device -May need a new sleep study  Plan/Recommendations: Prescription for Advair Prescription for Spiriva  Prescription for Neurontin  Continue Requip and wean down slowly  Try and get back to using CPAP on a regular basis  Tentative follow-up in about 6 weeks  Records  from care everywhere 10/02/2020 reviewed-saw Dr. Lavonda Jumbo -Obstructive sleep apnea with AHI of 14, RDI of 27, oxygen nadir of 87% -Titrated to a CPAP and subsequently to a BiPAP of 20/16  -Lung nodules-stable nodules with resolution of nodules  -Had been prescribed Neurontin 100 mg at the time with Requip, and the Neupro patch -There had been discussion at the time about referral for discussion regarding an inspire device  If able to obtain a download, may need to adjust BiPAP settings to see if it can be tolerated  I spent 45 minutes dedicated to the care of this patient on the date of this  encounter to include previsit review of records, face-to-face time with the patient discussing conditions above, post visit ordering of testing, clinical documentation with electronic health record and communicated necessary findings to members of the patient's care team  Sherrilyn Rist MD [employer] 05/05/2021, 1:50 PM  CC: Zenia Resides, MD

## 2021-05-07 ENCOUNTER — Telehealth: Payer: Self-pay | Admitting: Pulmonary Disease

## 2021-05-07 NOTE — Telephone Encounter (Addendum)
Medical record release faxed to [facility] Fax: [phone] for sleep study. Copy sent to scan center.

## 2021-05-26 DIAGNOSIS — S233XXA Sprain of ligaments of thoracic spine, initial encounter: Secondary | ICD-10-CM | POA: Diagnosis not present

## 2021-05-26 DIAGNOSIS — S338XXA Sprain of other parts of lumbar spine and pelvis, initial encounter: Secondary | ICD-10-CM | POA: Diagnosis not present

## 2021-05-26 DIAGNOSIS — S134XXA Sprain of ligaments of cervical spine, initial encounter: Secondary | ICD-10-CM | POA: Diagnosis not present

## 2021-05-29 DIAGNOSIS — S134XXA Sprain of ligaments of cervical spine, initial encounter: Secondary | ICD-10-CM | POA: Diagnosis not present

## 2021-05-29 DIAGNOSIS — S233XXA Sprain of ligaments of thoracic spine, initial encounter: Secondary | ICD-10-CM | POA: Diagnosis not present

## 2021-05-29 DIAGNOSIS — S338XXA Sprain of other parts of lumbar spine and pelvis, initial encounter: Secondary | ICD-10-CM | POA: Diagnosis not present

## 2021-06-01 DIAGNOSIS — S134XXA Sprain of ligaments of cervical spine, initial encounter: Secondary | ICD-10-CM | POA: Diagnosis not present

## 2021-06-01 DIAGNOSIS — S338XXA Sprain of other parts of lumbar spine and pelvis, initial encounter: Secondary | ICD-10-CM | POA: Diagnosis not present

## 2021-06-01 DIAGNOSIS — S233XXA Sprain of ligaments of thoracic spine, initial encounter: Secondary | ICD-10-CM | POA: Diagnosis not present

## 2021-06-05 DIAGNOSIS — S233XXA Sprain of ligaments of thoracic spine, initial encounter: Secondary | ICD-10-CM | POA: Diagnosis not present

## 2021-06-05 DIAGNOSIS — S338XXA Sprain of other parts of lumbar spine and pelvis, initial encounter: Secondary | ICD-10-CM | POA: Diagnosis not present

## 2021-06-05 DIAGNOSIS — S134XXA Sprain of ligaments of cervical spine, initial encounter: Secondary | ICD-10-CM | POA: Diagnosis not present

## 2021-06-09 DIAGNOSIS — S134XXA Sprain of ligaments of cervical spine, initial encounter: Secondary | ICD-10-CM | POA: Diagnosis not present

## 2021-06-09 DIAGNOSIS — S338XXA Sprain of other parts of lumbar spine and pelvis, initial encounter: Secondary | ICD-10-CM | POA: Diagnosis not present

## 2021-06-09 DIAGNOSIS — S233XXA Sprain of ligaments of thoracic spine, initial encounter: Secondary | ICD-10-CM | POA: Diagnosis not present

## 2021-06-10 ENCOUNTER — Other Ambulatory Visit: Payer: Self-pay

## 2021-06-10 ENCOUNTER — Encounter: Payer: Self-pay | Admitting: Family Medicine

## 2021-06-10 ENCOUNTER — Ambulatory Visit (INDEPENDENT_AMBULATORY_CARE_PROVIDER_SITE_OTHER): Payer: BC Managed Care – PPO | Admitting: Family Medicine

## 2021-06-10 VITALS — BP 123/66 | HR 84 | Temp 98.9°F | Ht 69.0 in | Wt 223.5 lb

## 2021-06-10 DIAGNOSIS — M20001 Unspecified deformity of right finger(s): Secondary | ICD-10-CM | POA: Diagnosis not present

## 2021-06-10 DIAGNOSIS — E538 Deficiency of other specified B group vitamins: Secondary | ICD-10-CM | POA: Diagnosis not present

## 2021-06-10 DIAGNOSIS — G2581 Restless legs syndrome: Secondary | ICD-10-CM

## 2021-06-10 DIAGNOSIS — Z23 Encounter for immunization: Secondary | ICD-10-CM | POA: Diagnosis not present

## 2021-06-10 DIAGNOSIS — M21942 Unspecified acquired deformity of hand, left hand: Secondary | ICD-10-CM | POA: Diagnosis not present

## 2021-06-10 DIAGNOSIS — B078 Other viral warts: Secondary | ICD-10-CM | POA: Diagnosis not present

## 2021-06-10 DIAGNOSIS — Z0000 Encounter for general adult medical examination without abnormal findings: Secondary | ICD-10-CM

## 2021-06-10 DIAGNOSIS — Z8639 Personal history of other endocrine, nutritional and metabolic disease: Secondary | ICD-10-CM

## 2021-06-10 DIAGNOSIS — M20002 Unspecified deformity of left finger(s): Secondary | ICD-10-CM

## 2021-06-10 NOTE — Progress Notes (Signed)
    SUBJECTIVE:   CHIEF COMPLAINT / HPI: discuss referral to neurology   Patient reports that she continues restless legs and would like to be evaluated by neurology. She reports increased frequency of symptoms at bed time despite gabapentin and cymbalta in the past.   Joint Deformities  Patient reports she has noticed a change to the shape of the joints in her hands in the last 6 months. She states that she is unable to make a fist and the joints in her fingers and knuckles appear "knotty". She denies knowledge of family hx of RA. She denies presence of weakness. She does endorse AM stiffness in her hands. She denies painful hands.   Hx of B12 Deficiency  Patient reports that she has a hx of vitamin B12 deficiency and was previously wearing vitamin B12 patches for better absorption. She does not take any supplements. She is requesting to have her vitamin levels checked today.    PERTINENT  PMH / PSH:  Sleep Apnea  Obesity   OBJECTIVE:   BP 123/66   Pulse 84   Temp 98.9 F (37.2 C) (Oral)   Ht $R'5\' 9"'Ae$  (1.753 m)   Wt 223 lb 8 oz (101.4 kg)   SpO2 98%   BMI 33.01 kg/m   General: female appearing stated age in no acute distress Cardio: Normal S1 and S2, no S3 or S4. Rhythm is regular. No murmurs or rubs.  Bilateral radial pulses palpable Pulm: Clear to auscultation bilaterally, no crackles, wheezing, or diminished breath sounds. Normal respiratory effort, stable on RA Abdomen: Bowel sounds normal. Abdomen soft and non-tender.  Extremities: No peripheral edema. Warm/ well perfused. Mild, subtle ulnar deviation of bilateral digits, mildy enlarged PIP joints on bilateral hands, no erythema, no warmth to touch, grip strength intact bilaterally, 5/5 strength in bilateral lower extremities, no obvious skin changes or deformities noted in legs, normal gait  ASSESSMENT/PLAN:   Acquired deformity of joint of finger of left hand Will evaluate for RF, ESR and CRP  Likely will need xrays in  the future   Acquired deformity of joint of finger of right hand Will evaluate for RF, ESR and CRP  Likely will need xrays in the future   Hx of non anemic vitamin B12 deficiency Will order vitamin B12 levels per patient request   Healthcare maintenance Influenza vaccine given today   Restless leg Amb ref to Neurology per patient request     [name], MD [place]

## 2021-06-10 NOTE — Patient Instructions (Signed)
It was a pleasure to see you today!  Thank you for choosing [facility] for your primary care.   [name] [name] was seen for referrals.   Our plans for today were: I have submitted referrals to both neurology and dermatology for your symptoms. Please be on the lookout for a call in the next few weeks to schedule.  We will check your vitamin B12 levels as well as some levels to look for inflammatory markers related to your joint changes.   To keep you healthy, please keep in mind the following health maintenance items that you are due for:   Pap smear Colonoscopy  Shingles Vaccine  Pneumonia Vaccine  Mammogram    You should return to our clinic in 4 weeks for follow up.   Best Wishes,   Dr. Alba Cory

## 2021-06-11 LAB — RHEUMATOID FACTOR: Rheumatoid fact SerPl-aCnc: <10 IU/mL (ref <14.0)

## 2021-06-11 LAB — VITAMIN B12: Vitamin B-12: 255 pg/mL (ref 232–1245)

## 2021-06-11 LAB — C-REACTIVE PROTEIN: CRP: 6 mg/L (ref 0–10)

## 2021-06-11 LAB — SEDIMENTATION RATE: Sed Rate: 25 mm/hr (ref 0–40)

## 2021-06-15 DIAGNOSIS — S134XXA Sprain of ligaments of cervical spine, initial encounter: Secondary | ICD-10-CM | POA: Diagnosis not present

## 2021-06-15 DIAGNOSIS — S338XXA Sprain of other parts of lumbar spine and pelvis, initial encounter: Secondary | ICD-10-CM | POA: Diagnosis not present

## 2021-06-15 DIAGNOSIS — S233XXA Sprain of ligaments of thoracic spine, initial encounter: Secondary | ICD-10-CM | POA: Diagnosis not present

## 2021-06-16 ENCOUNTER — Ambulatory Visit (INDEPENDENT_AMBULATORY_CARE_PROVIDER_SITE_OTHER): Payer: BC Managed Care – PPO | Admitting: Pulmonary Disease

## 2021-06-16 ENCOUNTER — Other Ambulatory Visit: Payer: Self-pay

## 2021-06-16 ENCOUNTER — Encounter: Payer: Self-pay | Admitting: Pulmonary Disease

## 2021-06-16 VITALS — BP 126/80 | HR 80 | Temp 99.1°F | Ht 69.0 in | Wt 223.4 lb

## 2021-06-16 DIAGNOSIS — M20001 Unspecified deformity of right finger(s): Secondary | ICD-10-CM | POA: Insufficient documentation

## 2021-06-16 DIAGNOSIS — G2581 Restless legs syndrome: Secondary | ICD-10-CM | POA: Insufficient documentation

## 2021-06-16 DIAGNOSIS — M19019 Primary osteoarthritis, unspecified shoulder: Secondary | ICD-10-CM | POA: Diagnosis not present

## 2021-06-16 DIAGNOSIS — M20002 Unspecified deformity of left finger(s): Secondary | ICD-10-CM | POA: Insufficient documentation

## 2021-06-16 DIAGNOSIS — G4733 Obstructive sleep apnea (adult) (pediatric): Secondary | ICD-10-CM | POA: Diagnosis not present

## 2021-06-16 DIAGNOSIS — M25511 Pain in right shoulder: Secondary | ICD-10-CM | POA: Diagnosis not present

## 2021-06-16 DIAGNOSIS — Z8639 Personal history of other endocrine, nutritional and metabolic disease: Secondary | ICD-10-CM | POA: Insufficient documentation

## 2021-06-16 NOTE — Assessment & Plan Note (Signed)
Will order vitamin B12 levels per patient request

## 2021-06-16 NOTE — Progress Notes (Signed)
[name] [name]    [id number]    1956-07-24  Primary Care Physician:Simmons-Robinson, Riki Sheer, MD  Referring Physician: Zenia Resides, MD [address] [place],  [place] [address]  Chief complaint:   Patient is being followed up for COPD, obstructive sleep apnea  HPI:  COPD diagnosed about 2017  -On Advair, Spiriva -As needed albuterol -Remains compliant with inhalers  Restless leg syndrome -On Requip -On gabapentin 300 mg, dose was increased during last visit -Symptoms are better controlled -She states she occasionally takes 100 mg in the morning as this helps  Obstructive sleep apnea was diagnosed in 2017 Was not able to get used to using CPAP on a regular basis Has some issues with claustrophobia Has had multiple mask changes in the past Still feels tired -May be related to restless legs that is now well controlled or possibility that she still has significant obstructive sleep apnea  Recent CT scan of the chest for lung nodules-stable  Quit smoking about 2010  No pertinent occupational history  Outpatient Encounter Medications as of 05/05/2021  Medication Sig   acetaminophen-codeine (TYLENOL #3) 300-30 MG tablet Take 1 tablet by mouth every 6 (six) hours as needed for moderate pain.   citalopram (CELEXA) 40 MG tablet Take 40 mg by mouth daily.   fluticasone-salmeterol (ADVAIR HFA) 230-21 MCG/ACT inhaler Inhale 2 puffs into the lungs 2 (two) times daily.   gabapentin (NEURONTIN) 100 MG capsule Take 100 mg by mouth at bedtime.   omeprazole (PRILOSEC) 20 MG capsule Take by mouth.   rOPINIRole (REQUIP) 4 MG tablet Take 4 mg by mouth at bedtime.   rosuvastatin (CRESTOR) 5 MG tablet Take 1 tablet (5 mg total) by mouth daily.   tiotropium (SPIRIVA) 18 MCG inhalation capsule Place 1.25 mcg into inhaler and inhale daily. 2 puffs daily    [DISCONTINUED] amoxicillin-clavulanate (AUGMENTIN) 875-125 MG tablet SMARTSIG:1 Tablet(s) By Mouth Every 12 Hours (Patient  not taking: Reported on 05/05/2021)   [DISCONTINUED] azithromycin (ZITHROMAX) 250 MG tablet Take 250 mg by mouth as directed. (Patient not taking: Reported on 05/05/2021)   [DISCONTINUED] dexamethasone (DECADRON) 6 MG tablet Take 6 mg by mouth daily.   [DISCONTINUED] nirmatrelvir & ritonavir (PAXLOVID) 20 x 150 MG & 10 x 100MG  TBPK Take by mouth.   [DISCONTINUED] predniSONE (DELTASONE) 20 MG tablet Take 20 mg by mouth 2 (two) times daily.   No facility-administered encounter medications on file as of 05/05/2021.    Allergies as of 05/05/2021 - Review Complete 05/05/2021  Allergen Reaction Noted   Buspirone Anxiety 08/08/2020   Mobic [meloxicam] Other (See Comments) 11/27/2020    Past Medical History:  Diagnosis Date   COPD (chronic obstructive pulmonary disease) (HCC)    OCD (obsessive compulsive disorder)    Restless legs syndrome (RLS)    Sleep apnea     Past Surgical History:  Procedure Laterality Date   ABDOMINAL HYSTERECTOMY     CESAREAN SECTION  1996   FRACTURE SURGERY     foot   JOINT REPLACEMENT     left shoulder    No family history on file.  Social History   Socioeconomic History   Marital status: Single    Spouse name: Not on file   Number of children: Not on file   Years of education: Not on file   Highest education level: Not on file  Occupational History   Not on file  Tobacco Use   Smoking status: Never   Smokeless  tobacco: Never  Substance and Sexual Activity   Alcohol use: Not on file   Drug use: Yes    Types: Marijuana   Sexual activity: Not on file  Other Topics Concern   Not on file  Social History Narrative   Not on file   Social Determinants of Health   Financial Resource Strain: Not on file  Food Insecurity: Not on file  Transportation Needs: Not on file  Physical Activity: Not on file  Stress: Not on file  Social Connections: Not on file  Intimate Partner Violence: Not on file    Review of Systems  Constitutional:  Positive  for fatigue.  Respiratory:  Positive for apnea.   Psychiatric/Behavioral:  Positive for sleep disturbance.    Vitals:   05/05/21 1343  BP: 130/80  Pulse: 97  SpO2: 97%     Physical Exam Constitutional:      Appearance: She is obese.  HENT:     Head: Normocephalic.     Nose: No congestion.     Mouth/Throat:     Mouth: Mucous membranes are moist.  Eyes:     Pupils: Pupils are equal, round, and reactive to light.  Cardiovascular:     Rate and Rhythm: Normal rate and regular rhythm.     Pulses: Normal pulses.     Heart sounds: No murmur heard.   No friction rub.  Pulmonary:     Effort: No respiratory distress.     Breath sounds: No stridor. No wheezing or rhonchi.  Musculoskeletal:     Cervical back: No rigidity or tenderness.  Neurological:     Mental Status: She is alert.  Psychiatric:        Mood and Affect: Mood normal.    Data Reviewed: Previous study not available  Assessment:  Obstructive sleep apnea -Intolerant of CPAP at present -Agreeable to have a repeat study performed to assess severity of sleep disordered breathing  Chronic obstructive pulmonary disease -Encouraged to continue Advair and Spiriva  Restless legs -Neurontin 300 mg nightly -Wean off Requip  Did discuss possibility of referral for an inspire device -May need a new sleep study   Plan/Recommendations: Continue Advair and Spiriva  Continue Neurontin  Schedule for in lab sleep study  Follow-up in 3 months    Records from care everywhere 10/02/2020 reviewed-saw Dr. Lavonda Jumbo -Obstructive sleep apnea with AHI of 14, RDI of 27, oxygen nadir of 87% -Titrated to a CPAP and subsequently to a BiPAP of 20/16  -Lung nodules-stable nodules with resolution of nodules  -Had been prescribed Neurontin 100 mg at the time with Requip, and the Neupro patch -There had been discussion at the time about referral for discussion regarding an inspire device  Sherrilyn Rist MD [place]  [facility] 05/05/2021, 1:50 PM  CC: Zenia Resides, MD

## 2021-06-16 NOTE — Assessment & Plan Note (Signed)
Will evaluate for RF, ESR and CRP  Likely will need xrays in the future

## 2021-06-16 NOTE — Assessment & Plan Note (Signed)
Amb ref to Neurology per patient request

## 2021-06-16 NOTE — Assessment & Plan Note (Signed)
- 

## 2021-06-16 NOTE — Patient Instructions (Signed)
Schedule in lab sleep study -Split-night study -Patient was on BiPAP  Continue using Spiriva and Advair  Continue Neurontin for restless legs  Tentative follow-up in 6 months  Discuss with your primary doctor regarding concerns about being able to go back to the office

## 2021-06-22 ENCOUNTER — Ambulatory Visit: Payer: BC Managed Care – PPO | Admitting: Internal Medicine

## 2021-06-23 ENCOUNTER — Ambulatory Visit: Payer: BC Managed Care – PPO | Admitting: Internal Medicine

## 2021-06-30 DIAGNOSIS — Z01818 Encounter for other preprocedural examination: Secondary | ICD-10-CM | POA: Diagnosis not present

## 2021-07-06 ENCOUNTER — Ambulatory Visit: Payer: BC Managed Care – PPO | Attending: Pulmonary Disease | Admitting: Pulmonary Disease

## 2021-07-06 DIAGNOSIS — G4752 REM sleep behavior disorder: Secondary | ICD-10-CM | POA: Insufficient documentation

## 2021-07-06 DIAGNOSIS — G4733 Obstructive sleep apnea (adult) (pediatric): Secondary | ICD-10-CM | POA: Insufficient documentation

## 2021-07-06 DIAGNOSIS — Z01818 Encounter for other preprocedural examination: Secondary | ICD-10-CM | POA: Diagnosis not present

## 2021-07-14 ENCOUNTER — Telehealth: Payer: Self-pay | Admitting: Pulmonary Disease

## 2021-07-14 DIAGNOSIS — Z8669 Personal history of other diseases of the nervous system and sense organs: Secondary | ICD-10-CM

## 2021-07-14 NOTE — Procedures (Signed)
POLYSOMNOGRAPHY  Last, First: [name], [name] MRN: [id number] Gender: Female Age (years): 32 Weight (lbs): 220 DOB: [birth year] BMI: 32 Primary Care: No PCP Epworth Score: N/A Referring: Laurin Coder MD Technician: Rosebud Poles Interpreting: Laurin Coder MD Study Type: NPSG Ordered Study Type: Split Night CPAP Study date: 07/06/2021 Location: [place] CLINICAL INFORMATION [name] [name] is a 65 year old Female and was referred to the sleep center for evaluation of N/A. Indications include N/A.  MEDICATIONS Patient self administered medications include: N/A. Medications administered during study include No sleep medicine administered.  SLEEP STUDY TECHNIQUE A multi-channel overnight Polysomnography study was performed. The channels recorded and monitored were central and occipital EEG, electrooculogram (EOG), submentalis EMG (chin), nasal and oral airflow, thoracic and abdominal wall motion, anterior tibialis EMG, snore microphone, electrocardiogram, and a pulse oximetry. TECHNICIAN COMMENTS Comments added by Technician: Patient was restless all through the night. Patient very tired and sleepy prior to lights-off time. Patient did not meet SPLIT night protocol due to AHI of 7/hr, within the 2-3 hrs TST. RBD noticed throughout study/sleep talking and waking up confused. Significant leg and body jerks noticed during testing. Loud to very loud snoring noticed throughout study Comments added by Scorer: N/A SLEEP ARCHITECTURE The study was initiated at 9:55:16 PM and terminated at 5:08:38 AM. The total recorded time was 433.4 minutes. EEG confirmed total sleep time was 372.5 minutes yielding a sleep efficiency of 86.0%. Sleep onset after lights out was 20.2 minutes with a REM latency of 135.0 minutes. The patient spent 9.66% of the night in stage N1 sleep, 46.85% in stage N2 sleep, 8.19% in stage N3 and 35.3% in REM. Wake after sleep onset (WASO) was 40.6 minutes. The Arousal Index was  19.3/hour. RESPIRATORY PARAMETERS There were a total of 69 respiratory disturbances out of which 9 were apneas ( 9 obstructive, 0 mixed, 0 central) and 60 hypopneas. The apnea/hypopnea index (AHI) was 11.1 events/hour. The central sleep apnea index was 0 events/hour. The REM AHI was 0.5 events/hour and NREM AHI was 16.9 events/hour. The supine AHI was 49.2 events/hour and the non supine AHI was 1.2 supine during 20.61% of sleep. Respiratory disturbances were associated with oxygen desaturation down to a nadir of 84.00% during sleep. The mean oxygen saturation during the study was 93.71%. The cumulative time under 88% oxygen saturation was 3.3 minutes.  LEG MOVEMENT DATA The total leg movements were 0 with a resulting leg movement index of 0.00/hr .Associated arousal with leg movement index was 0.0/hr.  CARDIAC DATA The underlying cardiac rhythm was most consistent with sinus rhythm. Mean heart rate during sleep was 81.42 bpm. Additional rhythm abnormalities include PVCs.  IMPRESSIONS - Mild Obstructive Sleep apnea(OSA) - REM behavior disorder-restless, sleep talking, waking up multiple body movements during REM sleep - Electrocardiographic data showed presence of PVCs. - Mild Oxygen Desaturation - The patient snored with loud snoring volume. - No significant periodic leg movements(PLMs) during sleep. However, no significant associated arousals.  DIAGNOSIS - Obstructive Sleep Apnea (G47.33) - REM Behavior Disorder (G47.52)  RECOMMENDATIONS - Therapeutic CPAP titration to determine optimal pressure required to alleviate sleep disordered breathing may be cosidered. - Positional therapy avoiding supine position during sleep, most significant number of events occured during supine sleep. - Aggressive weight loss measures should help severity of sleep disordered breathing along with avoiding supine sleep position. - An oral device may be considered as an option of treatment of mouse could  disordered breathing - REM behavior disorder may be treated with melatonin  or Klonopin, safe sleep environment to avoid injuries. - Avoid alcohol, sedatives and other CNS depressants that may worsen sleep apnea and disrupt normal sleep architecture. - Sleep hygiene should be reviewed to assess factors that may improve sleep quality. - Weight management and regular exercise should be initiated or continued.  [Electronically signed] 07/14/2021 05:33 AM  Sherrilyn Rist MD NPI: [id number]

## 2021-07-14 NOTE — Telephone Encounter (Signed)
Call patient  Sleep study result  Date of study: 07/06/2021  Impression: Mild obstructive sleep apnea REM behavior disorder-restless, multiple body movements during REM sleep, sleep talking  Recommendation:  Options of treatment for mild sleep apnea will include  1.  Therapeutic CPAP titration to determine optimal pressure required to alleviate sleep disordered breathing may be cosidered.  2.  Positional therapy avoiding supine position during sleep, most significant number of events occured during supine sleep.  3.- Aggressive weight loss measures should help severity of sleep disordered breathing along with avoiding supine sleep position.  4.  An oral device may be considered as an option of treatment of mouse could disordered breathing  5.  REM behavior disorder may be treated with melatonin or Klonopin, safe sleep environment to avoid injuries.  Offer patient appointment to discuss options of treatment

## 2021-07-14 NOTE — Telephone Encounter (Signed)
Call made to patient, confirmed DOB. Made aware of Sleep results per AO. Voiced understanding. She would like to try the oral device. Made aware we make her provider aware and get back with her. Voiced understanding.   AO please advise if okay to go ahead and place referral to Dr Ron Parker for eval for oral device. Thanks :)

## 2021-07-17 NOTE — Telephone Encounter (Signed)
Order has been placed.  Nothing further is needed

## 2021-07-17 NOTE — Telephone Encounter (Signed)
Okay to refer to Dr. Ron Parker for oral device

## 2021-07-20 DIAGNOSIS — R0683 Snoring: Secondary | ICD-10-CM | POA: Diagnosis not present

## 2021-07-23 DIAGNOSIS — S134XXA Sprain of ligaments of cervical spine, initial encounter: Secondary | ICD-10-CM | POA: Diagnosis not present

## 2021-07-23 DIAGNOSIS — S233XXA Sprain of ligaments of thoracic spine, initial encounter: Secondary | ICD-10-CM | POA: Diagnosis not present

## 2021-07-23 DIAGNOSIS — S338XXA Sprain of other parts of lumbar spine and pelvis, initial encounter: Secondary | ICD-10-CM | POA: Diagnosis not present

## 2021-07-30 DIAGNOSIS — S134XXA Sprain of ligaments of cervical spine, initial encounter: Secondary | ICD-10-CM | POA: Diagnosis not present

## 2021-07-30 DIAGNOSIS — S338XXA Sprain of other parts of lumbar spine and pelvis, initial encounter: Secondary | ICD-10-CM | POA: Diagnosis not present

## 2021-07-30 DIAGNOSIS — S233XXA Sprain of ligaments of thoracic spine, initial encounter: Secondary | ICD-10-CM | POA: Diagnosis not present

## 2021-08-05 ENCOUNTER — Encounter: Payer: Self-pay | Admitting: Family Medicine

## 2021-08-05 ENCOUNTER — Encounter: Payer: Self-pay | Admitting: Pulmonary Disease

## 2021-08-06 DIAGNOSIS — S233XXA Sprain of ligaments of thoracic spine, initial encounter: Secondary | ICD-10-CM | POA: Diagnosis not present

## 2021-08-06 DIAGNOSIS — S338XXA Sprain of other parts of lumbar spine and pelvis, initial encounter: Secondary | ICD-10-CM | POA: Diagnosis not present

## 2021-08-06 DIAGNOSIS — S134XXA Sprain of ligaments of cervical spine, initial encounter: Secondary | ICD-10-CM | POA: Diagnosis not present

## 2021-08-06 MED ORDER — ALBUTEROL SULFATE HFA 108 (90 BASE) MCG/ACT IN AERS: 2.0000 | INHALATION_SPRAY | Freq: Four times a day (QID) | RESPIRATORY_TRACT | 6 refills | Status: DC | PRN

## 2021-08-11 ENCOUNTER — Other Ambulatory Visit: Payer: Self-pay

## 2021-08-11 ENCOUNTER — Telehealth (INDEPENDENT_AMBULATORY_CARE_PROVIDER_SITE_OTHER): Payer: BC Managed Care – PPO | Admitting: Student

## 2021-08-11 DIAGNOSIS — F419 Anxiety disorder, unspecified: Secondary | ICD-10-CM | POA: Diagnosis not present

## 2021-08-11 NOTE — Patient Instructions (Addendum)
It was great to see you! Thank you for allowing me to participate in your care!   Our plans for today:  - I put in a referral to psychiatry to help with your panic attacks and feelings of anxiety. I have also attached more resources below for therapists that can provide more support. - I am sending a letter to you for your work to excuse you for 90 days until we can get you set up with psychiatry to help as well    Therapy and Counseling Resources Most providers on this list will take Medicaid. Patients with commercial insurance or Medicare should contact their insurance company to get a list of in network providers.  [facility]:Psychiatry and Counseling [address] [place], [place] [address] [place], [place], [place] [address]      [place] [address]  [place], [place] [address] [phone]  [place] [address], [place]  [place], [place] [address]       [phone]     [facility] (virtual only) [phone]  [facility] [address] [place], [place]  [facility]:  [place]. [place] [phone]  [facility]:  [place] [address] [place] [phone]  [facility] (under & uninsured) [address] [place] [phone]    [email or web address]    [place] [address]  [place]    [phone]  [facility] [place]     Phone:  [phone]     [place]  [place] [address]      [place], [place]: [place], [place], [place]   [place] ([place] therapist) [email or web address]  [place] [address]   [place] [address]    [phone]    [facility]   [address],  [place], [place]   [place]  [place]  [facility]  [address] [place], [place]        [phone]  Open Access/Walk In Clinic under & uninsured  [facility]  [address] [place], [place] [facility] Crisis [phone]  [facility] [place],  ([place])   [place], [place]: [phone]) 8:30 - 12; 1 - 2:30  [facility] the [place],  [place], [place]    ([phone]):8:30 - 12; 2 - 3PM  [facility] [employer],  [address],  [place]; [phone]):   Mon - Fri 8 AM - 5 PM  [facility] [place]  MWF 12:30 to 3:00 or call to schedule an appointment  [phone]  Specific Provider options Psychology Today  [email or web address] click on find a therapist  enter your zip code left side and select or tailor a therapist for your specific need.   [facility] Provider Directory [email or web address]  (Medicaid)   Follow all drop down to find a provider  [place] or [email or web address] [address], [place], [place] Recovery support and educational  24- Hour Availability:   [facility]  [address] [place], [place] Crisis [phone]  [facility] [employer] [place]  [phone]   [place]  [phone] (after hours)  Therapeutic Alternative/Mobile Crisis   [phone]  [place] National Suicide Hotline  (718)546-6334 Diamantina Monks)  Call 911 or go to emergency room  [facility]  [phone]);  [place] and [employer]  [phone]); [place], [place], [place], [place], [place], [place], [place]   Take care and seek immediate care sooner if you develop any concerns. Please remember to show up 15 minutes before your scheduled appointment time!  Gerrit Heck, MD [place]

## 2021-08-11 NOTE — Progress Notes (Signed)
[facility] Telemedicine Visit  Patient consented to have virtual visit and was identified by name and date of birth. Method of visit: Video  Encounter participants: Patient: [name] [name] - located at home Provider: Gerrit Heck - located at [facility] clinic  Chief Complaint: Anxiety about Working in Person  HPI:  Patient message from 12/14 "Since the Covid pandemic began I have been working from home.  My company now wants me to come into the office twice a week.  The thought of going into that office gives me major panic attacks. I can't breathe, I have heart palpitation, and I become extremely dizzy.  Especially now with RSV, Flu and an up tic in Covid cases.   It's not only my physical health but my mental health as well.  I know I'll die if I have to go back to that office."  Patient relayed that her work is now wanting her to come into the office in person. She relayed that pulmonology originally gave a note 4 months ago excusing patient from in-person work due to COPD/covid risk. Her work revisited this and said she needs to work on coming back in person. Pulmonology subsequently couldn't write another letter for her work. 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 She has been having panic attacks about thinking about going back to work and was very tearful and afraid of the future during our visit.   ROS: per HPI  Pertinent PMHx: COPD  Exam:  There were no vitals taken for this visit.  Gen: Non-toxic but appeared upset and anxious about her situation, tearful  Assessment/Plan:  Anxiety Sent letter to her for work excuse for next 90 days. Have referred patient to psychiatry to try and establish care for panic attacks. Can follow up  in 2.5 months to see if there has been any improvements.    Time spent during visit with patient: 30 minutes

## 2021-08-11 NOTE — Assessment & Plan Note (Signed)
Sent letter to her for work excuse for next 90 days. Have referred patient to psychiatry to try and establish care for panic attacks. Can follow up in 2.5 months to see if there has been any improvements.

## 2021-08-13 ENCOUNTER — Encounter: Payer: Self-pay | Admitting: Student

## 2021-08-13 DIAGNOSIS — S134XXA Sprain of ligaments of cervical spine, initial encounter: Secondary | ICD-10-CM | POA: Diagnosis not present

## 2021-08-13 DIAGNOSIS — S338XXA Sprain of other parts of lumbar spine and pelvis, initial encounter: Secondary | ICD-10-CM | POA: Diagnosis not present

## 2021-08-13 DIAGNOSIS — S233XXA Sprain of ligaments of thoracic spine, initial encounter: Secondary | ICD-10-CM | POA: Diagnosis not present

## 2021-08-18 ENCOUNTER — Other Ambulatory Visit: Payer: Self-pay | Admitting: Pulmonary Disease

## 2021-08-18 DIAGNOSIS — S233XXA Sprain of ligaments of thoracic spine, initial encounter: Secondary | ICD-10-CM | POA: Diagnosis not present

## 2021-08-18 DIAGNOSIS — S134XXA Sprain of ligaments of cervical spine, initial encounter: Secondary | ICD-10-CM | POA: Diagnosis not present

## 2021-08-18 DIAGNOSIS — S338XXA Sprain of other parts of lumbar spine and pelvis, initial encounter: Secondary | ICD-10-CM | POA: Diagnosis not present

## 2021-08-21 DIAGNOSIS — G8918 Other acute postprocedural pain: Secondary | ICD-10-CM | POA: Diagnosis not present

## 2021-08-21 DIAGNOSIS — M19011 Primary osteoarthritis, right shoulder: Secondary | ICD-10-CM | POA: Diagnosis not present

## 2021-08-21 DIAGNOSIS — M19019 Primary osteoarthritis, unspecified shoulder: Secondary | ICD-10-CM | POA: Diagnosis not present

## 2021-08-21 DIAGNOSIS — M7521 Bicipital tendinitis, right shoulder: Secondary | ICD-10-CM | POA: Diagnosis not present

## 2021-08-21 DIAGNOSIS — M25512 Pain in left shoulder: Secondary | ICD-10-CM | POA: Diagnosis not present

## 2021-08-27 ENCOUNTER — Ambulatory Visit: Payer: BC Managed Care – PPO | Admitting: Neurology

## 2021-09-01 DIAGNOSIS — D485 Neoplasm of uncertain behavior of skin: Secondary | ICD-10-CM | POA: Diagnosis not present

## 2021-09-01 DIAGNOSIS — Z85828 Personal history of other malignant neoplasm of skin: Secondary | ICD-10-CM | POA: Diagnosis not present

## 2021-09-01 DIAGNOSIS — L820 Inflamed seborrheic keratosis: Secondary | ICD-10-CM | POA: Diagnosis not present

## 2021-09-01 DIAGNOSIS — L570 Actinic keratosis: Secondary | ICD-10-CM | POA: Diagnosis not present

## 2021-09-04 ENCOUNTER — Other Ambulatory Visit: Payer: Self-pay | Admitting: Pulmonary Disease

## 2021-09-08 ENCOUNTER — Other Ambulatory Visit: Payer: Self-pay | Admitting: Pulmonary Disease

## 2021-09-09 ENCOUNTER — Other Ambulatory Visit: Payer: Self-pay | Admitting: Pulmonary Disease

## 2021-09-09 DIAGNOSIS — M25511 Pain in right shoulder: Secondary | ICD-10-CM | POA: Diagnosis not present

## 2021-09-14 DIAGNOSIS — M25511 Pain in right shoulder: Secondary | ICD-10-CM | POA: Diagnosis not present

## 2021-09-14 DIAGNOSIS — Z471 Aftercare following joint replacement surgery: Secondary | ICD-10-CM | POA: Diagnosis not present

## 2021-09-14 DIAGNOSIS — M6281 Muscle weakness (generalized): Secondary | ICD-10-CM | POA: Diagnosis not present

## 2021-09-16 DIAGNOSIS — M25511 Pain in right shoulder: Secondary | ICD-10-CM | POA: Diagnosis not present

## 2021-09-16 DIAGNOSIS — Z471 Aftercare following joint replacement surgery: Secondary | ICD-10-CM | POA: Diagnosis not present

## 2021-09-16 DIAGNOSIS — M6281 Muscle weakness (generalized): Secondary | ICD-10-CM | POA: Diagnosis not present

## 2021-09-22 DIAGNOSIS — M6281 Muscle weakness (generalized): Secondary | ICD-10-CM | POA: Diagnosis not present

## 2021-09-22 DIAGNOSIS — M25511 Pain in right shoulder: Secondary | ICD-10-CM | POA: Diagnosis not present

## 2021-09-22 DIAGNOSIS — Z471 Aftercare following joint replacement surgery: Secondary | ICD-10-CM | POA: Diagnosis not present

## 2021-09-24 DIAGNOSIS — M25511 Pain in right shoulder: Secondary | ICD-10-CM | POA: Diagnosis not present

## 2021-09-24 DIAGNOSIS — M6281 Muscle weakness (generalized): Secondary | ICD-10-CM | POA: Diagnosis not present

## 2021-09-24 DIAGNOSIS — S134XXA Sprain of ligaments of cervical spine, initial encounter: Secondary | ICD-10-CM | POA: Diagnosis not present

## 2021-09-24 DIAGNOSIS — S338XXA Sprain of other parts of lumbar spine and pelvis, initial encounter: Secondary | ICD-10-CM | POA: Diagnosis not present

## 2021-09-24 DIAGNOSIS — S233XXA Sprain of ligaments of thoracic spine, initial encounter: Secondary | ICD-10-CM | POA: Diagnosis not present

## 2021-09-24 DIAGNOSIS — Z471 Aftercare following joint replacement surgery: Secondary | ICD-10-CM | POA: Diagnosis not present

## 2021-09-29 DIAGNOSIS — Z471 Aftercare following joint replacement surgery: Secondary | ICD-10-CM | POA: Diagnosis not present

## 2021-09-29 DIAGNOSIS — M25511 Pain in right shoulder: Secondary | ICD-10-CM | POA: Diagnosis not present

## 2021-09-29 DIAGNOSIS — M6281 Muscle weakness (generalized): Secondary | ICD-10-CM | POA: Diagnosis not present

## 2021-10-01 DIAGNOSIS — M25511 Pain in right shoulder: Secondary | ICD-10-CM | POA: Diagnosis not present

## 2021-10-01 DIAGNOSIS — Z471 Aftercare following joint replacement surgery: Secondary | ICD-10-CM | POA: Diagnosis not present

## 2021-10-01 DIAGNOSIS — M6281 Muscle weakness (generalized): Secondary | ICD-10-CM | POA: Diagnosis not present

## 2021-10-06 DIAGNOSIS — Z471 Aftercare following joint replacement surgery: Secondary | ICD-10-CM | POA: Diagnosis not present

## 2021-10-06 DIAGNOSIS — M6281 Muscle weakness (generalized): Secondary | ICD-10-CM | POA: Diagnosis not present

## 2021-10-06 DIAGNOSIS — M25511 Pain in right shoulder: Secondary | ICD-10-CM | POA: Diagnosis not present

## 2021-10-07 ENCOUNTER — Encounter: Payer: Self-pay | Admitting: Student

## 2021-10-07 DIAGNOSIS — F419 Anxiety disorder, unspecified: Secondary | ICD-10-CM

## 2021-10-08 DIAGNOSIS — Z471 Aftercare following joint replacement surgery: Secondary | ICD-10-CM | POA: Diagnosis not present

## 2021-10-08 DIAGNOSIS — M25511 Pain in right shoulder: Secondary | ICD-10-CM | POA: Diagnosis not present

## 2021-10-08 DIAGNOSIS — M6281 Muscle weakness (generalized): Secondary | ICD-10-CM | POA: Diagnosis not present

## 2021-10-09 ENCOUNTER — Encounter: Payer: Self-pay | Admitting: Neurology

## 2021-10-09 ENCOUNTER — Ambulatory Visit (INDEPENDENT_AMBULATORY_CARE_PROVIDER_SITE_OTHER): Payer: BC Managed Care – PPO | Admitting: Neurology

## 2021-10-09 ENCOUNTER — Telehealth: Payer: Self-pay | Admitting: Neurology

## 2021-10-09 VITALS — BP 132/74 | HR 78 | Ht 69.0 in | Wt 226.0 lb

## 2021-10-09 DIAGNOSIS — G2581 Restless legs syndrome: Secondary | ICD-10-CM | POA: Insufficient documentation

## 2021-10-09 DIAGNOSIS — R292 Abnormal reflex: Secondary | ICD-10-CM

## 2021-10-09 DIAGNOSIS — H903 Sensorineural hearing loss, bilateral: Secondary | ICD-10-CM

## 2021-10-09 HISTORY — DX: Restless legs syndrome: G25.81

## 2021-10-09 MED ORDER — GABAPENTIN 300 MG PO CAPS: 600.0000 mg | ORAL_CAPSULE | Freq: Three times a day (TID) | ORAL | 11 refills | Status: DC

## 2021-10-09 MED ORDER — CLONAZEPAM 0.5 MG PO TABS: 0.5000 mg | ORAL_TABLET | Freq: Every evening | ORAL | 5 refills | Status: DC | PRN

## 2021-10-09 MED ORDER — ROPINIROLE HCL 4 MG PO TABS: 4.0000 mg | ORAL_TABLET | Freq: Every day | ORAL | 3 refills | Status: DC

## 2021-10-09 NOTE — Telephone Encounter (Signed)
MRI is to done at [facility]

## 2021-10-09 NOTE — Progress Notes (Signed)
Chief Complaint  Patient presents with   New Patient (Initial Visit)    Room 14 w/ friend, Luvenia Starch. Referred for RLS evaluation. Hard of hearing. She is using ropinirole 36m, one tabl QHS and gabapentin 3039m one tab QHS. Feels this medication regimen is not working well.       ASSESSMENT AND PLAN  [name] [name] a [age].o. female   Severe restless leg syndrome with augmentation  Keep Requip 4 mg, before bedtime, higher dose of gabapentin 300 mg up to 2 tablets 3 times a day,  Laboratory evaluations including ferritin level  Periodic leg movement disorder  Clonazepam 0.5 mg as needed  Chronic neck pain, bilateral shoulder pain,  Hyperreflexia on examination, mildly length dependent sensory changes  MRI of cervical spine to rule out spondylitic myelopathy  Sensory neuronal hearing loss, right facial paresthesia  MRI of brain with without contrast with internal acoustic canal   DIAGNOSTIC DATA (LABS, IMAGING, TESTING) - I reviewed patient records, labs, notes, testing and imaging myself where available.  Laboratory evaluations in October 2022,  B12 255, ESR C-reactive protein rheumatoid factor, vitamin D 34, normal CMP    MEDICAL HISTORY:  [name] [name] a [age]ear old female, seen in request by Dr. EnAndrena Mews, for evaluation of restless leg syndrome, her primary care physician is Dr. SiEulis FosterMD, she is accompanied by her friend JaCorey Skainst today's visit on October 09, 2021  I reviewed and summarized the referring note. PMHx COPD, smoker in the past OCD HLD GERD Obstructive sleep apnea, use CPAP Bilateral shoulder replacement,  right Dec 2022, Nov 2021  She reported restless leg syndrome since 1995, initially she has the urge to kick her leg, move her leg when sitting still, if she turns her neck in certain way it will help,  Eventually she was treated with Requip, low-dose at the beginning, gradually titrating to 4 mg every night around  2019, it used to be able to control her restless leg symptoms well  But she began to experience increased restless leg syndrome around 2020, especially since 2022, she is now taking Requip 4 mg and the gabapentin 300 mg at 6 PM, another gabapentin 1 to 200 mg during the day,   She complains such severe restless leg symptoms, she has to carve herself in a fetal position put pressure on her knee and lacks to falling to sleep,  She also have significant periodic leg movement disorder, woke up oftentimes her bed has moved few inches away from its original position, when she had bilateral shoulder replacement, she lived with her friends, who observed her almost constant bilateral neck, arm movement during her sleep, sometimes abnormal movements will wake her up  She also has to get out of the car couple times if they drive for 4 hours, she cannot even finish a meal with her friend without pacing around  She denies persistent left lower extremity sensory or motor deficit, denies gait abnormality   Over the past few years, she also developed gradual worsening hearing loss, never had a imaging study of her brain  She also has neck pain, radiating pain to bilateral shoulder despite bilateral shoulder replacements surgery   PHYSICAL EXAM:   Vitals:   10/09/21 0848  BP: 132/74  Pulse: 78  Weight: 226 lb (102.5 kg)  Height: 5' 9"  (1.753 m)   Not recorded     Body mass index is 33.37 kg/m.  PHYSICAL EXAMNIATION:  Gen: NAD, conversant, well  nourised, well groomed                     Cardiovascular: Regular rate rhythm, no peripheral edema, warm, nontender. Eyes: Conjunctivae clear without exudates or hemorrhage Neck: Supple, no carotid bruits. Pulmonary: Clear to auscultation bilaterally   NEUROLOGICAL EXAM:  MENTAL STATUS: Speech:    Speech is normal; fluent and spontaneous with normal comprehension.  Cognition:     Orientation to time, place and person     Normal recent and  remote memory     Normal Attention span and concentration     Normal Language, naming, repeating,spontaneous speech     Fund of knowledge   CRANIAL NERVES: CN II: Visual fields are full to confrontation. Pupils are round equal and briskly reactive to light. CN III, IV, VI: extraocular movement are normal. No ptosis. CN V: Facial sensation is intact to light touch CN VII: Face is symmetric with normal eye closure  CN VIII: Hard of hearing, air conduction more than bony conduction bilaterally, Weber signs in the midline CN IX, X: Phonation is normal. CN XI: Head turning and shoulder shrug are intact  MOTOR: There is no pronator drift of out-stretched arms. Muscle bulk and tone are normal. Muscle strength is normal.  REFLEXES: Reflexes are 2+ and symmetric at the biceps, triceps, 3/3 knees, and ankles. Plantar responses are extensor bilaterally  SENSORY: Decreased vibratory sensation at toes, preserved fine touch, pinprick  COORDINATION: There is no trunk or limb dysmetria noted.  GAIT/STANCE: Posture is normal. Gait is steady with normal steps, base, arm swing, and turning. Heel and toe walking are normal. Tandem gait is normal.  Romberg is absent.  REVIEW OF SYSTEMS:  Full 14 system review of systems performed and notable only for as above All other review of systems were negative.   ALLERGIES: Allergies  Allergen Reactions   Mobic [Meloxicam] Other (See Comments)    HOME MEDICATIONS: Current Outpatient Medications  Medication Sig Dispense Refill   ADVAIR HFA 230-21 MCG/ACT inhaler INHALE 2 PUFFS INTO THE LUNGS TWICE DAILY 12 g 1   albuterol (VENTOLIN HFA) 108 (90 Base) MCG/ACT inhaler Inhale 2 puffs into the lungs every 6 (six) hours as needed for wheezing or shortness of breath. 8 g 6   citalopram (CELEXA) 40 MG tablet Take 40 mg by mouth daily.     gabapentin (NEURONTIN) 300 MG capsule TAKE 1 CAPSULE(300 MG) BY MOUTH AT BEDTIME 30 capsule 3   omeprazole (PRILOSEC) 20  MG capsule Take by mouth.     rOPINIRole (REQUIP) 4 MG tablet Take 1 tablet (4 mg total) by mouth at bedtime. 30 tablet 3   rosuvastatin (CRESTOR) 5 MG tablet Take 1 tablet (5 mg total) by mouth daily. 90 tablet 3   SPIRIVA RESPIMAT 2.5 MCG/ACT AERS INHALE 2 PUFFS INTO THE LUNGS DAILY 4 g 3   No current facility-administered medications for this visit.    PAST MEDICAL HISTORY: Past Medical History:  Diagnosis Date   COPD (chronic obstructive pulmonary disease) ([facility])    Hard of hearing    OCD (obsessive compulsive disorder)    Restless legs syndrome (RLS)    Sleep apnea     PAST SURGICAL HISTORY: Past Surgical History:  Procedure Laterality Date   ABDOMINAL HYSTERECTOMY     CESAREAN SECTION  1996   FRACTURE SURGERY     foot   JOINT REPLACEMENT     left shoulder   JOINT REPLACEMENT     right  shoulder    FAMILY HISTORY: Family History  Problem Relation Age of Onset   Alzheimer's disease Mother    Heart attack Father     SOCIAL HISTORY: Social History   Socioeconomic History   Marital status: Single    Spouse name: Not on file   Number of children: 2   Years of education: college   Highest education level: Not on file  Occupational History   Occupation: Sales Rep  Tobacco Use   Smoking status: Never   Smokeless tobacco: Never  Substance and Sexual Activity   Alcohol use: Yes    Comment: Occasional   Drug use: Yes    Types: Marijuana   Sexual activity: Not on file  Other Topics Concern   Not on file  Social History Narrative   Lives alone.   Caffeine use: occasional use   Right-handed.   Social Determinants of Health   Financial Resource Strain: Not on file  Food Insecurity: Not on file  Transportation Needs: Not on file  Physical Activity: Not on file  Stress: Not on file  Social Connections: Not on file  Intimate Partner Violence: Not on file      Marcial Pacas, M.D. Ph.D.  [facility] [address], [place],  [place] [address] Ph: [phone] Fax: [phone]  CC:  Kinnie Feil, MD [place],  [place] [address]  [name], MD

## 2021-10-09 NOTE — Patient Instructions (Signed)
Meds ordered this encounter  Medications   gabapentin (NEURONTIN) 300 MG capsule--may take up to 6 tablets a day                clonazePAM (KLONOPIN) 0.5 MG tablet--- as needed if you could not fall into sleep                rOPINIRole (REQUIP) 4 MG tablet----30 to 60 minutes before bed time

## 2021-10-12 ENCOUNTER — Telehealth: Payer: Self-pay | Admitting: *Deleted

## 2021-10-12 DIAGNOSIS — M6281 Muscle weakness (generalized): Secondary | ICD-10-CM | POA: Diagnosis not present

## 2021-10-12 DIAGNOSIS — Z471 Aftercare following joint replacement surgery: Secondary | ICD-10-CM | POA: Diagnosis not present

## 2021-10-12 DIAGNOSIS — M25511 Pain in right shoulder: Secondary | ICD-10-CM | POA: Diagnosis not present

## 2021-10-12 NOTE — Telephone Encounter (Signed)
-----  Message from Genia Harold, MD sent at 10/12/2021  4:21 PM EST ----- B12 is low, she should let her PCP know so they can make sure she is getting enough supplementation. A1c is in prediabetes range and she should work with PCP on blood sugar control. TSH, CBC, CRP, ESR, and ANA are unremarkable.  Pending labs: Multiple myeloma panel is pending. RPR is reactive, but antibody levels are still pending.

## 2021-10-12 NOTE — Telephone Encounter (Signed)
Spoke with [name] [name] and provided lab results to patient. Informed pt to follow up with PCP regarding low B12 and prediabetic A1c levels. Also informed patient of reactive RPR. Pt denied any known history of syphilis. I discussed the possibility of a false positive RPR and repeat testing with pt. Told pt we will provide a call back when multiple myeloma panel is complete. Pt verbalized understanding and expressed appreciation for the call.

## 2021-10-13 ENCOUNTER — Telehealth: Payer: Self-pay | Admitting: *Deleted

## 2021-10-13 LAB — RPR, QUANT+TP ABS (REFLEX)
Rapid Plasma Reagin, Quant: 1:4 {titer} — ABNORMAL HIGH
T Pallidum Abs: NONREACTIVE

## 2021-10-13 LAB — CBC WITH DIFFERENTIAL/PLATELET
Basophils Absolute: 0 10*3/uL (ref 0.0–0.2)
Basos: 0 %
EOS (ABSOLUTE): 0.1 10*3/uL (ref 0.0–0.4)
Eos: 1 %
Hematocrit: 33.7 % — ABNORMAL LOW (ref 34.0–46.6)
Hemoglobin: 10.8 g/dL — ABNORMAL LOW (ref 11.1–15.9)
Immature Grans (Abs): 0 10*3/uL (ref 0.0–0.1)
Immature Granulocytes: 0 %
Lymphocytes Absolute: 2.1 10*3/uL (ref 0.7–3.1)
Lymphs: 25 %
MCH: 27.7 pg (ref 26.6–33.0)
MCHC: 32 g/dL (ref 31.5–35.7)
MCV: 86 fL (ref 79–97)
Monocytes Absolute: 0.6 10*3/uL (ref 0.1–0.9)
Monocytes: 7 %
Neutrophils Absolute: 5.6 10*3/uL (ref 1.4–7.0)
Neutrophils: 67 %
Platelets: 209 10*3/uL (ref 150–450)
RBC: 3.9 x10E6/uL (ref 3.77–5.28)
RDW: 13.6 % (ref 11.7–15.4)
WBC: 8.5 10*3/uL (ref 3.4–10.8)

## 2021-10-13 LAB — MULTIPLE MYELOMA PANEL, SERUM
Albumin SerPl Elph-Mcnc: 3.8 g/dL (ref 2.9–4.4)
Albumin/Glob SerPl: 1.4 (ref 0.7–1.7)
Alpha 1: 0.2 g/dL (ref 0.0–0.4)
Alpha2 Glob SerPl Elph-Mcnc: 0.7 g/dL (ref 0.4–1.0)
B-Globulin SerPl Elph-Mcnc: 0.8 g/dL (ref 0.7–1.3)
Gamma Glob SerPl Elph-Mcnc: 1 g/dL (ref 0.4–1.8)
Globulin, Total: 2.8 g/dL (ref 2.2–3.9)
IgA/Immunoglobulin A, Serum: 112 mg/dL (ref 87–352)
IgG (Immunoglobin G), Serum: 1031 mg/dL (ref 586–1602)
IgM (Immunoglobulin M), Srm: 176 mg/dL (ref 26–217)
M Protein SerPl Elph-Mcnc: 1.7 g/dL — ABNORMAL HIGH
Total Protein: 6.6 g/dL (ref 6.0–8.5)

## 2021-10-13 LAB — FERRITIN: Ferritin: 17 ng/mL (ref 15–150)

## 2021-10-13 LAB — ANA W/REFLEX IF POSITIVE: Anti Nuclear Antibody (ANA): NEGATIVE

## 2021-10-13 LAB — RPR: RPR Ser Ql: REACTIVE — AB

## 2021-10-13 LAB — TSH: TSH: 3.56 u[IU]/mL (ref 0.450–4.500)

## 2021-10-13 LAB — CK: Total CK: 53 U/L (ref 32–182)

## 2021-10-13 LAB — COPPER, SERUM: Copper: 106 ug/dL (ref 80–158)

## 2021-10-13 LAB — VITAMIN B12: Vitamin B-12: 177 pg/mL — ABNORMAL LOW (ref 232–1245)

## 2021-10-13 LAB — SEDIMENTATION RATE: Sed Rate: 14 mm/hr (ref 0–40)

## 2021-10-13 LAB — C-REACTIVE PROTEIN: CRP: 5 mg/L (ref 0–10)

## 2021-10-13 LAB — HGB A1C W/O EAG: Hgb A1c MFr Bld: 5.8 % — ABNORMAL HIGH (ref 4.8–5.6)

## 2021-10-13 NOTE — Telephone Encounter (Signed)
PA for gabapentin started on covermymeds (key: [id number]). Requires case for quantity. Pt has pharmacy coverage through [facility] [phone]). Decision pending.

## 2021-10-14 DIAGNOSIS — M25511 Pain in right shoulder: Secondary | ICD-10-CM | POA: Diagnosis not present

## 2021-10-14 DIAGNOSIS — Z471 Aftercare following joint replacement surgery: Secondary | ICD-10-CM | POA: Diagnosis not present

## 2021-10-14 DIAGNOSIS — M6281 Muscle weakness (generalized): Secondary | ICD-10-CM | POA: Diagnosis not present

## 2021-10-14 NOTE — Telephone Encounter (Signed)
Case [id number] approved through 10/13/2022.

## 2021-10-15 ENCOUNTER — Encounter: Payer: Self-pay | Admitting: Family Medicine

## 2021-10-15 ENCOUNTER — Telehealth: Payer: Self-pay | Admitting: Neurology

## 2021-10-15 NOTE — Telephone Encounter (Signed)
Noted, [employer] no auth req ref # Danadia R order sent to [facility] to be scheduled at [facility]

## 2021-10-15 NOTE — Telephone Encounter (Signed)
[employer] no auth req ref # Danadia R order sent to [facility] to be scheduled at [facility]

## 2021-10-19 ENCOUNTER — Other Ambulatory Visit: Payer: Self-pay | Admitting: Family Medicine

## 2021-10-19 DIAGNOSIS — F419 Anxiety disorder, unspecified: Secondary | ICD-10-CM

## 2021-10-19 NOTE — Progress Notes (Signed)
Patient has history of GAD and panic attacks. She would like referral to behavioral health psychologist in [place], [place]. Referral placed.  Gladys Damme, MD [facility], PGY-3

## 2021-10-20 ENCOUNTER — Encounter: Payer: Self-pay | Admitting: Family Medicine

## 2021-10-20 DIAGNOSIS — Z471 Aftercare following joint replacement surgery: Secondary | ICD-10-CM | POA: Diagnosis not present

## 2021-10-20 DIAGNOSIS — M25511 Pain in right shoulder: Secondary | ICD-10-CM | POA: Diagnosis not present

## 2021-10-20 DIAGNOSIS — M6281 Muscle weakness (generalized): Secondary | ICD-10-CM | POA: Diagnosis not present

## 2021-10-22 DIAGNOSIS — M25511 Pain in right shoulder: Secondary | ICD-10-CM | POA: Diagnosis not present

## 2021-10-22 DIAGNOSIS — M6281 Muscle weakness (generalized): Secondary | ICD-10-CM | POA: Diagnosis not present

## 2021-10-22 DIAGNOSIS — Z471 Aftercare following joint replacement surgery: Secondary | ICD-10-CM | POA: Diagnosis not present

## 2021-10-27 DIAGNOSIS — M25511 Pain in right shoulder: Secondary | ICD-10-CM | POA: Diagnosis not present

## 2021-10-27 DIAGNOSIS — Z471 Aftercare following joint replacement surgery: Secondary | ICD-10-CM | POA: Diagnosis not present

## 2021-10-27 DIAGNOSIS — M6281 Muscle weakness (generalized): Secondary | ICD-10-CM | POA: Diagnosis not present

## 2021-10-29 DIAGNOSIS — M25511 Pain in right shoulder: Secondary | ICD-10-CM | POA: Diagnosis not present

## 2021-10-29 DIAGNOSIS — Z471 Aftercare following joint replacement surgery: Secondary | ICD-10-CM | POA: Diagnosis not present

## 2021-10-29 DIAGNOSIS — M6281 Muscle weakness (generalized): Secondary | ICD-10-CM | POA: Diagnosis not present

## 2021-11-02 ENCOUNTER — Encounter: Payer: Self-pay | Admitting: Neurology

## 2021-11-02 ENCOUNTER — Ambulatory Visit (HOSPITAL_COMMUNITY)
Admission: RE | Admit: 2021-11-02 | Discharge: 2021-11-02 | Disposition: A | Discharge status: Home / Self Care | Payer: BC Managed Care – PPO | Source: Ambulatory Visit | Attending: Neurology | Admitting: Neurology

## 2021-11-02 ENCOUNTER — Other Ambulatory Visit: Payer: Self-pay

## 2021-11-02 ENCOUNTER — Telehealth: Payer: Self-pay | Admitting: Neurology

## 2021-11-02 DIAGNOSIS — M2578 Osteophyte, vertebrae: Secondary | ICD-10-CM | POA: Diagnosis not present

## 2021-11-02 DIAGNOSIS — R292 Abnormal reflex: Secondary | ICD-10-CM

## 2021-11-02 DIAGNOSIS — M4802 Spinal stenosis, cervical region: Secondary | ICD-10-CM | POA: Diagnosis not present

## 2021-11-02 DIAGNOSIS — H903 Sensorineural hearing loss, bilateral: Secondary | ICD-10-CM | POA: Insufficient documentation

## 2021-11-02 DIAGNOSIS — H9192 Unspecified hearing loss, left ear: Secondary | ICD-10-CM | POA: Diagnosis not present

## 2021-11-02 DIAGNOSIS — M4310 Spondylolisthesis, site unspecified: Secondary | ICD-10-CM | POA: Diagnosis not present

## 2021-11-02 DIAGNOSIS — G2581 Restless legs syndrome: Secondary | ICD-10-CM | POA: Insufficient documentation

## 2021-11-02 IMAGING — MR MR BRAIN/TEMPORAL BONE/IAC
17 of 22 series · 29 of 48 positions shown · IV contrast (gadavist)
Comparison: Cervical spine MRI today reported separately.

CLINICAL DATA: 65-year-old female with left side hearing loss. Neck
pain and bilateral arm numbness.

EXAM:
MRI HEAD WITHOUT AND WITH CONTRAST
TECHNIQUE: Multiplanar, multiecho pulse sequences of the brain and surrounding
structures were obtained without and with intravenous contrast.
CONTRAST:  7mL GADAVIST GADOBUTROL 1 MMOL/ML IV SOLN

[Series 5: ax dwi_tracew · axial · 4.0mm · 0.88mm/px · z∈[+32,+172]mm · 2 of 36 slices shown (1 of 2)]
[im 1/36]
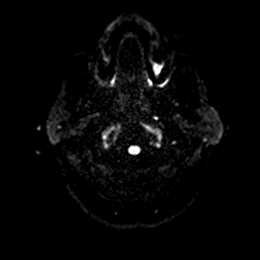
[im 36/36]
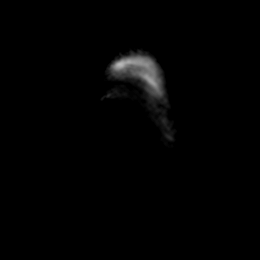

[Series 5: ax dwi_tracew · axial · 4.0mm · 0.88mm/px · z∈[+32,+172]mm · 2 of 36 slices shown (2 of 2)]
[im 1/36]
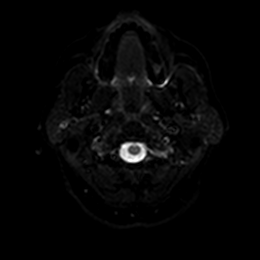
[im 36/36]
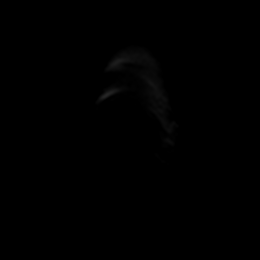

[Series 6: ax dwi_adc · axial · 4.0mm · 0.88mm/px · z∈[+32,+172]mm · 2 of 36 slices shown]
[im 1/36]
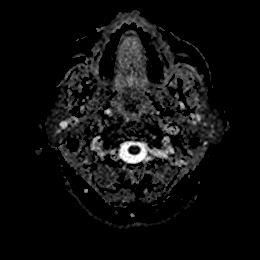
[im 36/36]
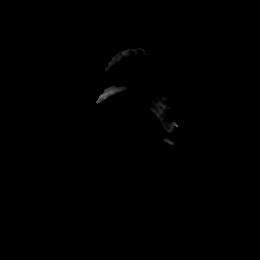

[Series 11: DWI · coronal · 5.0mm · 0.88mm/px · 1 of 28 slices shown (1 of 3)]
[im 1/28]
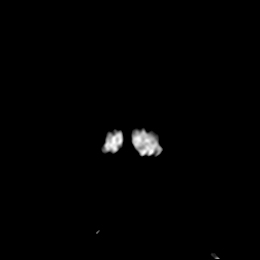

[Series 11: DWI · coronal · 5.0mm · 0.88mm/px · 1 of 28 slices shown (2 of 3)]
[im 1/28]
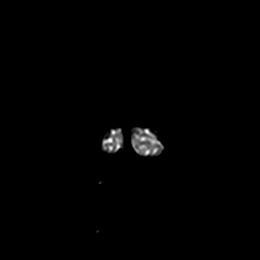

[Series 12: DWI · coronal · 5.0mm · 0.88mm/px · 1 of 28 slices shown (3 of 3)]
[im 1/28]
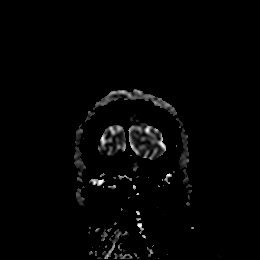

[Series 13: T1 · sagittal · 5.0mm · 0.94mm/px · 1 of 21 slices shown (1 of 3)]
[im 1/21]
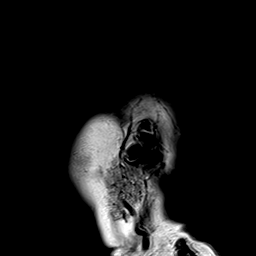

[Series 14: T2 · axial · 5.0mm · 0.72mm/px · 1 of 21 slices shown (1 of 2)]
[im 1/21]
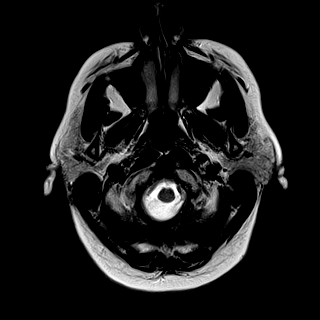

[Series 15: mag_images · axial · 3.0mm · 0.90mm/px · z∈[+28,+204]mm · 3 of 60 slices shown]
[im 1/60]
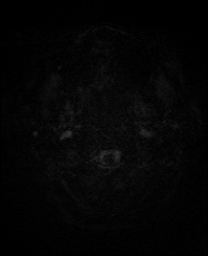
[im 30/60]
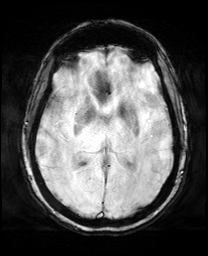
[im 60/60]
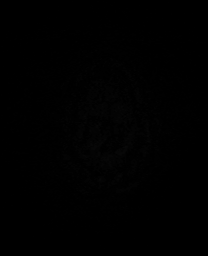

[Series 16: pha_images · axial · 3.0mm · 0.90mm/px · z∈[+28,+204]mm · 3 of 60 slices shown]
[im 1/60]
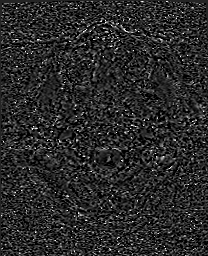
[im 30/60]
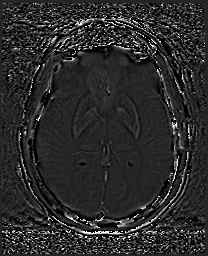
[im 60/60]
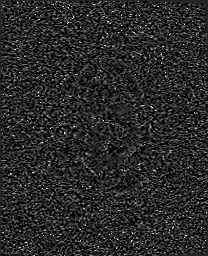

[Series 17: swi_images · axial · 3.0mm · 0.90mm/px · z∈[+28,+204]mm · 3 of 60 slices shown]
[im 1/60]
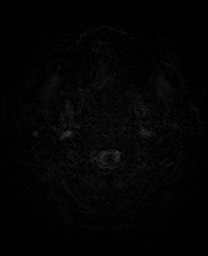
[im 30/60]
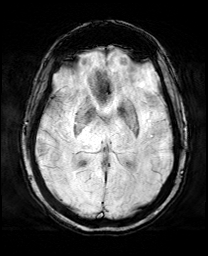
[im 60/60]
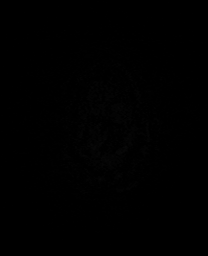

[Series 19: FLAIR · axial · 4.0mm · 0.43mm/px · z∈[+43,+190]mm · 2 of 38 slices shown]
[im 1/38]
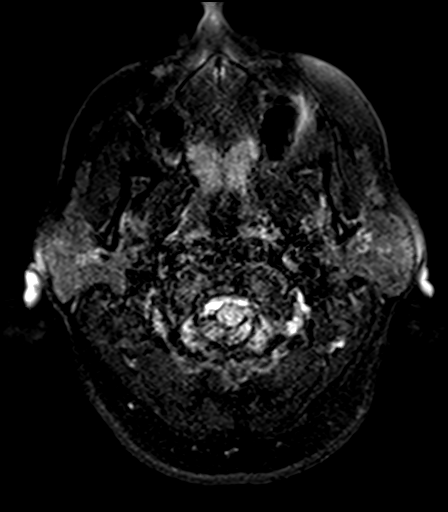
[im 38/38]
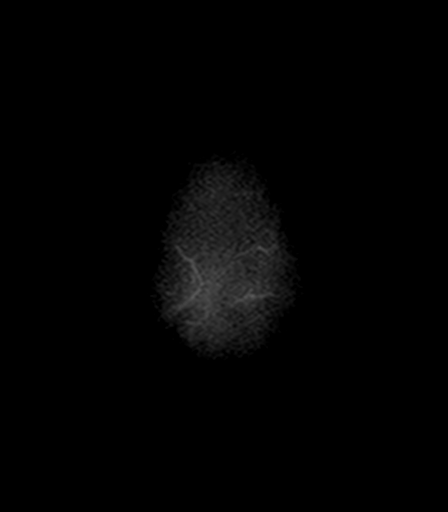

[Series 21: T1 · coronal · non-contrast · 3.0mm · 0.21mm/px · 1 of 13 slices shown (2 of 3)]
[im 1/13]
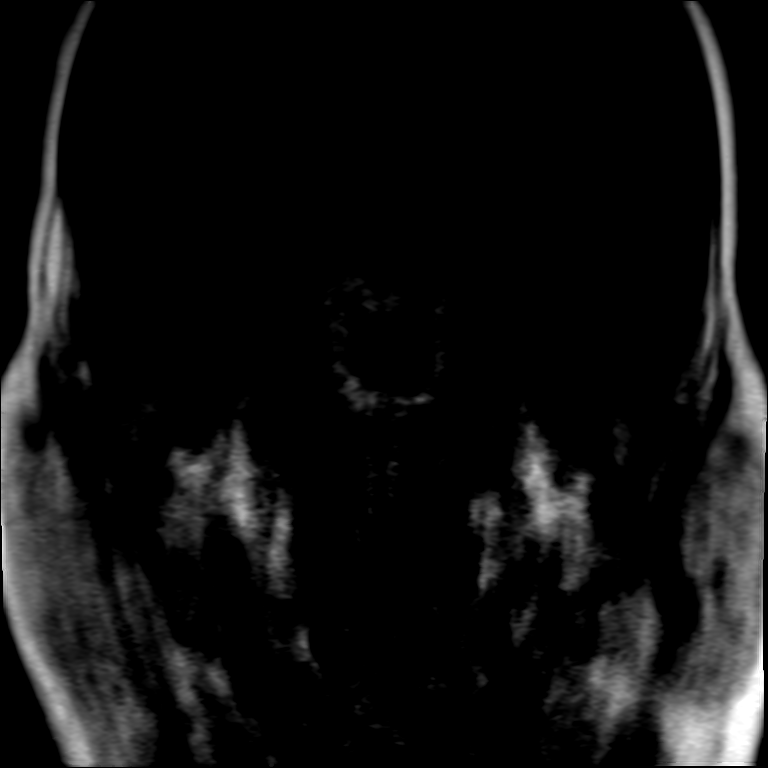

[Series 22: T1 · axial · non-contrast · 3.0mm · 0.21mm/px · 1 of 15 slices shown (3 of 3)]
[im 1/15]
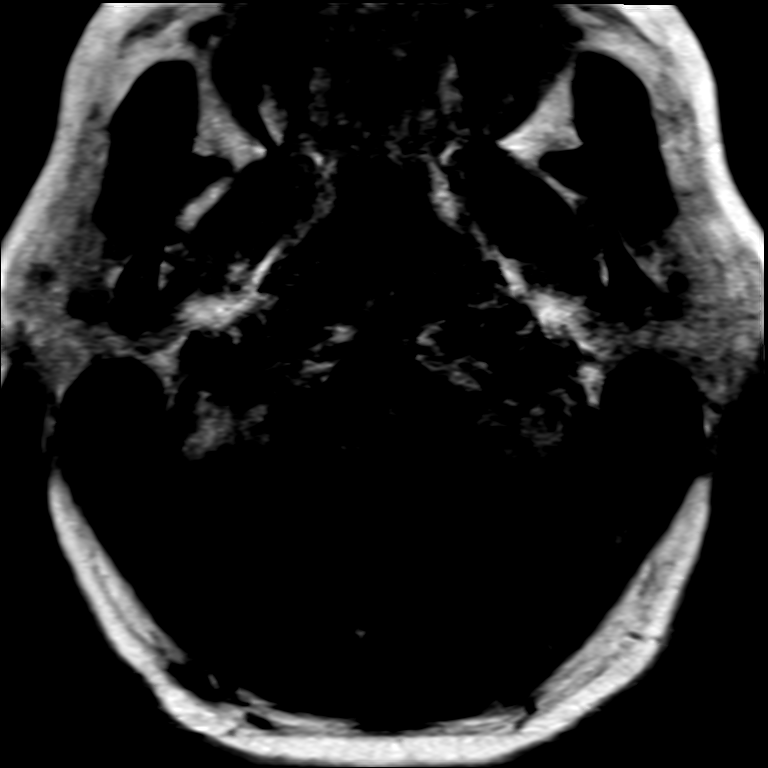

[Series 23: t2_space_tra_(id)_iso · axial · 0.6mm · 0.30mm/px · z∈[+42,+77]mm · 3 of 60 slices shown]
[im 1/60]
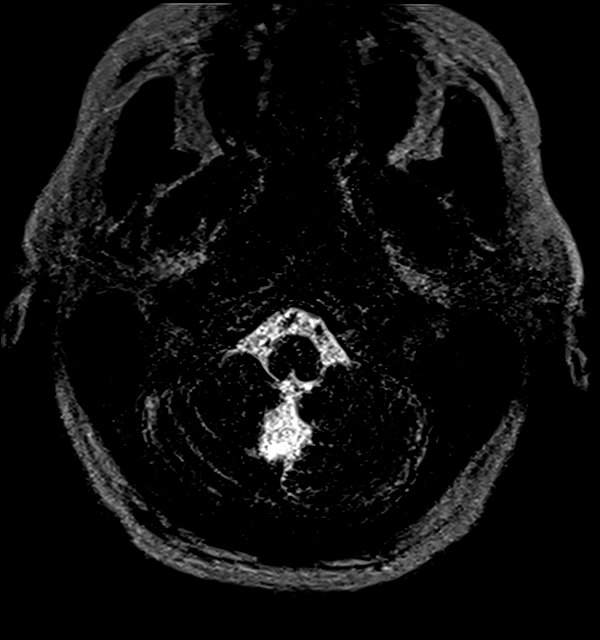
[im 30/60]
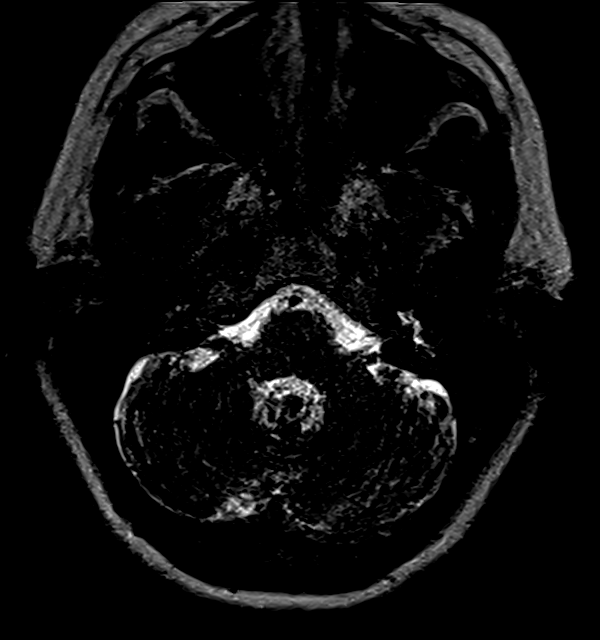
[im 60/60]
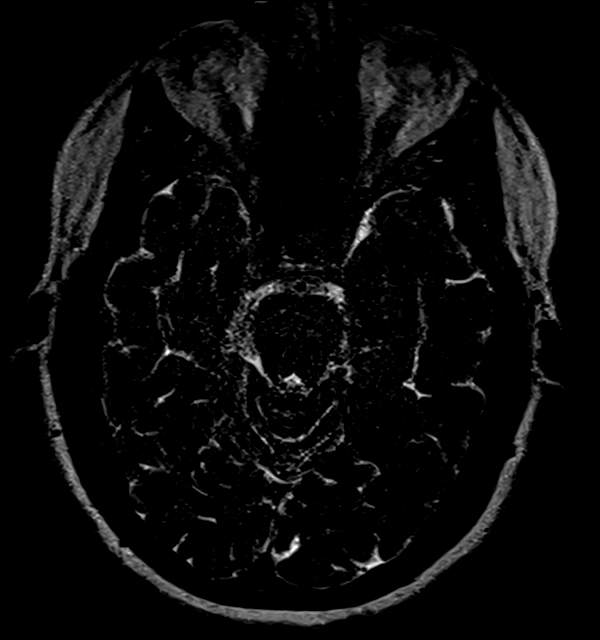

[Series 24: T2 · coronal · 5.0mm · 0.72mm/px · 1 of 24 slices shown (2 of 2)]
[im 1/24]
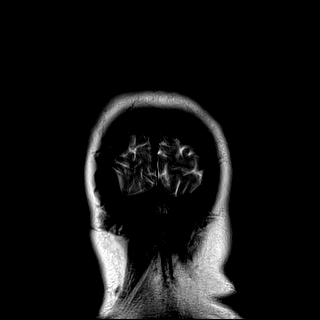

[Series 28: T1 post-contrast · coronal · 5.0mm · 0.34mm/px · 1 of 26 slices shown]
[im 1/26]
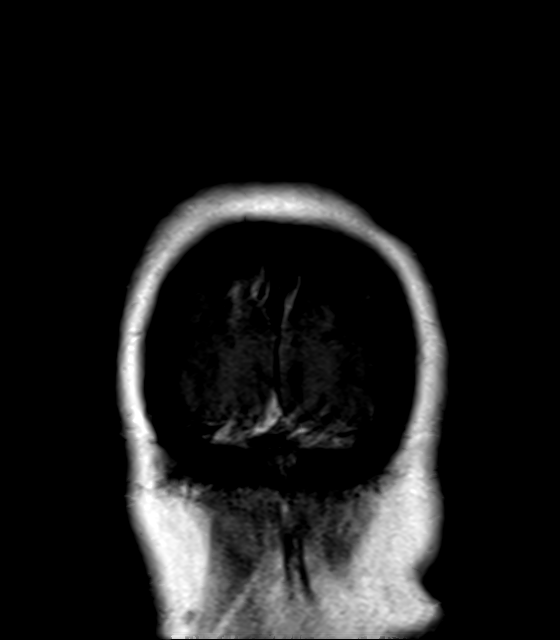

[29 of 48 positions shown; findings below may reference images not displayed]

FINDINGS: Study is mildly degraded by motion artifact despite repeated imaging
attempts.

Brain: Cerebral volume is within normal limits for age. No
restricted diffusion to suggest acute infarction. No midline shift,
mass effect, evidence of mass lesion, ventriculomegaly, extra-axial
collection or acute intracranial hemorrhage. Cervicomedullary
junction and pituitary are within normal limits.

Perivascular space right inferior lentiform (normal variant). Gray
and white matter signal is within normal limits for age throughout
the brain. No cortical encephalomalacia or chronic cerebral blood
products identified. No abnormal enhancement identified. No dural
thickening identified.

Vascular: Major intracranial vascular flow voids are preserved. The
major dural venous sinuses appear to be enhancing and patent.

Skull and upper cervical spine: Cervical spine detailed separately.
Visualized bone marrow signal is within normal limits.

Sinuses/Orbits: Negative.

Other: Dedicated internal auditory pre and postcontrast imaging.
Normal cerebellopontine angles. Grossly normal bilateral cisternal
and intracanalicular 7th and 8th cranial nerve segments, better
demonstrated on the routine axial T2 imaging (series 14, image 5).
Mastoids are clear. No abnormal enhancement identified. Normal
stylomastoid foramina. Parotid glands are within normal limits.
IMPRESSION: Intermittently motion degraded exam.

Allowing for artifact MRI appearance of the brain and internal
auditory structures is normal for age.

## 2021-11-02 IMAGING — MR MR CERVICAL SPINE W/O CM
5 series · 36 of 48 positions shown · non-contrast
Comparison: Brain MRI the same day reported separately.

CLINICAL DATA: 65-year-old female with left side hearing loss. Neck
pain and bilateral arm numbness.

EXAM:
MRI CERVICAL SPINE WITHOUT CONTRAST
TECHNIQUE: Multiplanar, multisequence MR imaging of the cervical spine was
performed. No intravenous contrast was administered.

[Series 5: t2_tse_sag_fast · sagittal · 3.0mm · 0.43mm/px · 5 of 15 slices shown]
[im 1/15]
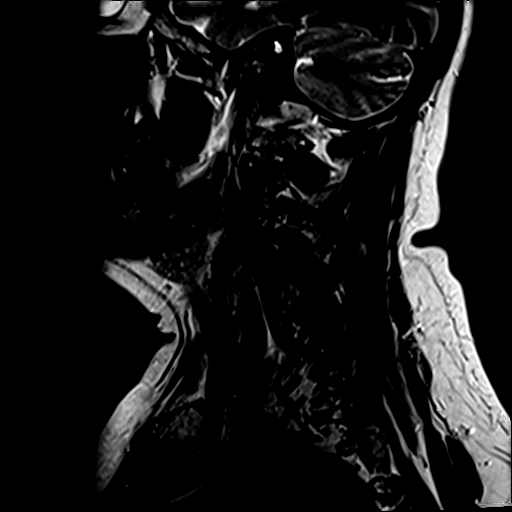
[im 4/15]
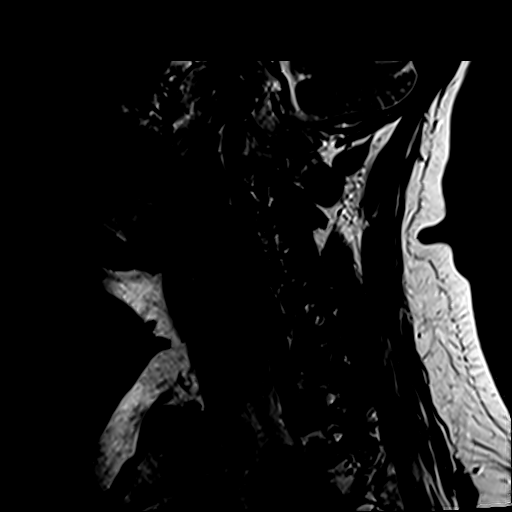
[im 8/15]
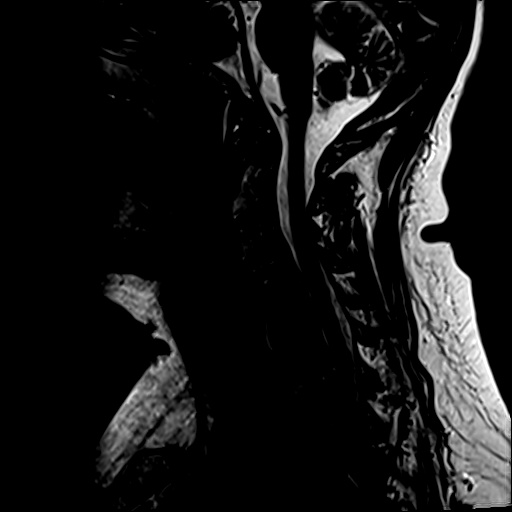
[im 11/15]
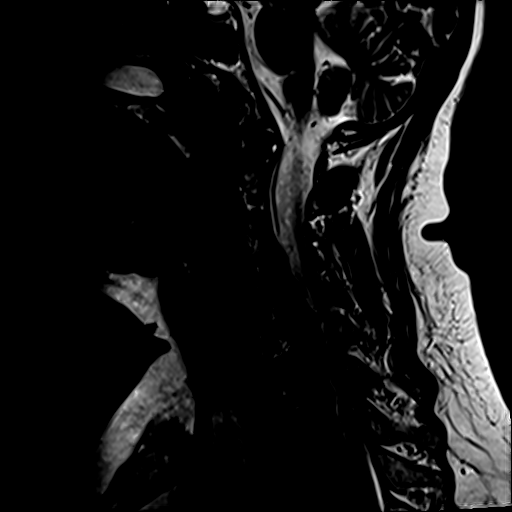
[im 15/15]
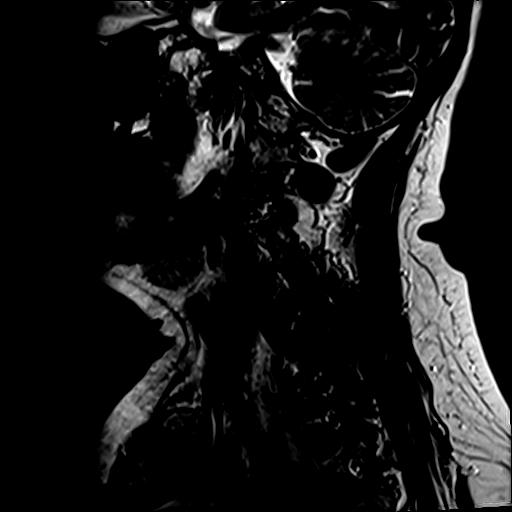

[Series 6: t1_tse_sag_fast · sagittal · 3.0mm · 0.43mm/px · 5 of 15 slices shown]
[im 1/15]
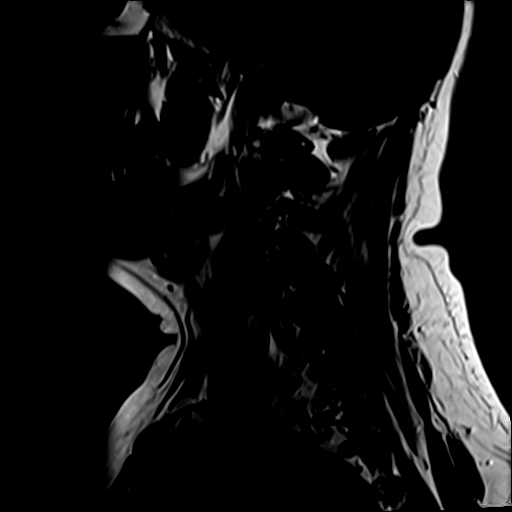
[im 4/15]
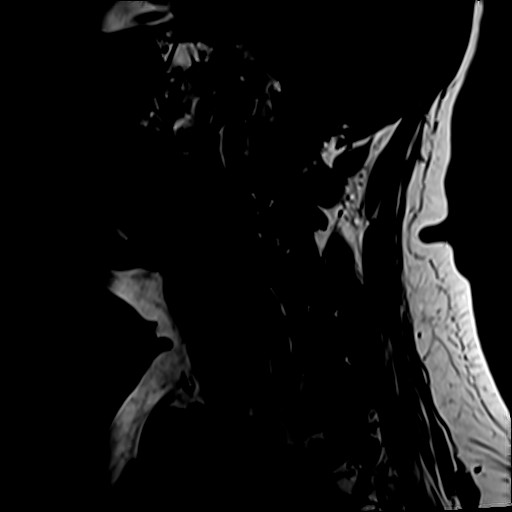
[im 8/15]
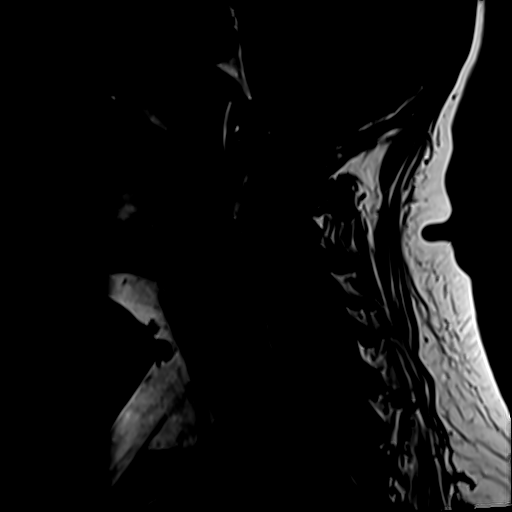
[im 11/15]
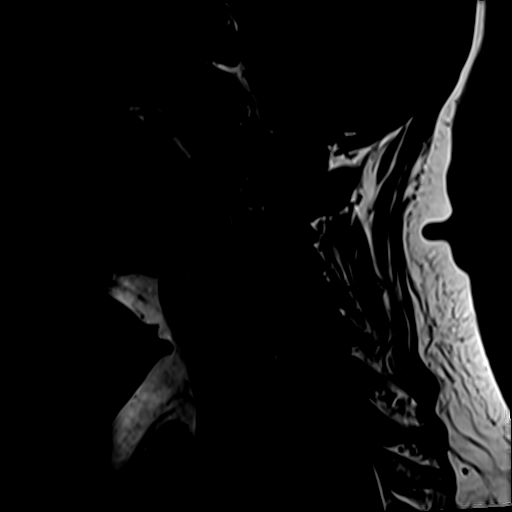
[im 15/15]
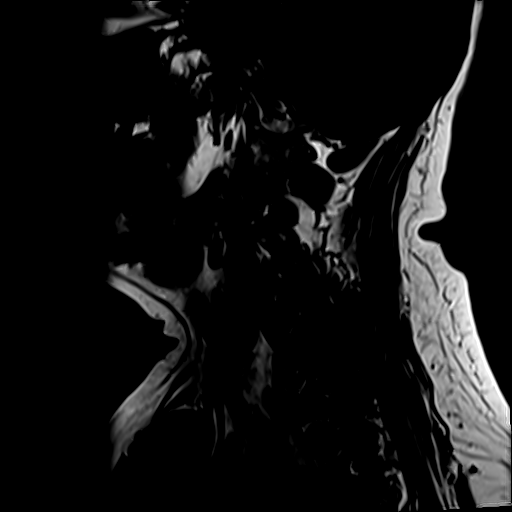

[Series 7: STIR · sagittal · 3.0mm · 0.86mm/px · 6 of 15 slices shown]
[im 1/15]
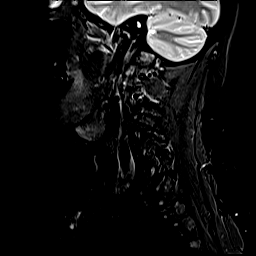
[im 3/15]
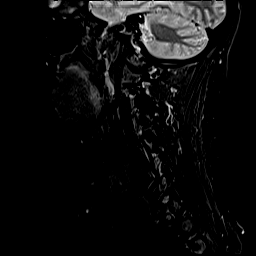
[im 6/15]
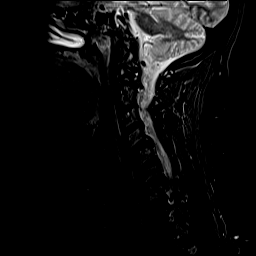
[im 9/15]
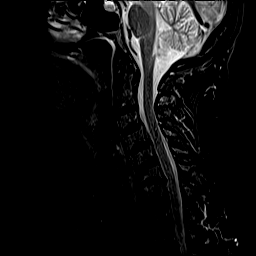
[im 12/15]
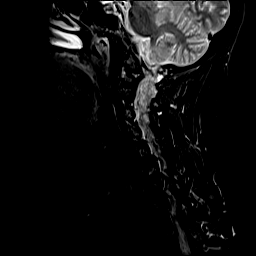
[im 15/15]
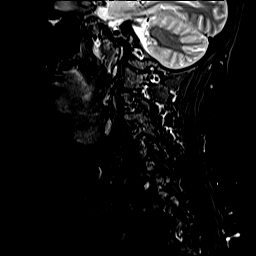

[Series 8: t2_tse_tra_fast · axial · 3.0mm · 0.78mm/px · z∈[-162,-33]mm · 12 of 42 slices shown]
[im 1/42]
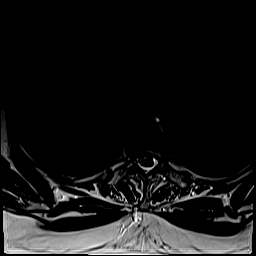
[im 3/42]
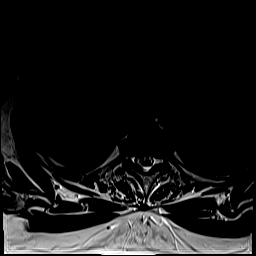
[im 6/42]
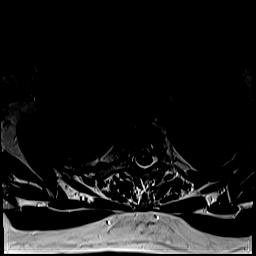
[im 9/42]
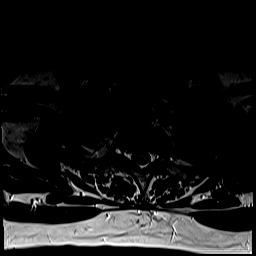
[im 11/42]
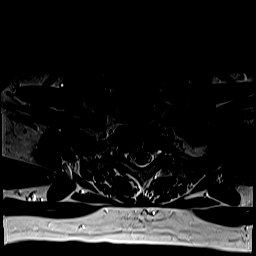
[im 14/42]
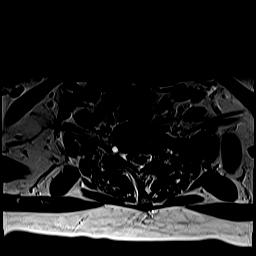
[im 20/42]
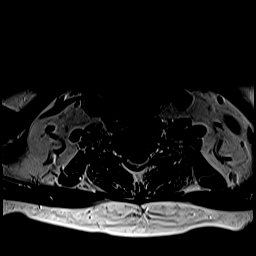
[im 22/42]
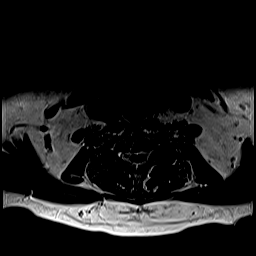
[im 25/42]
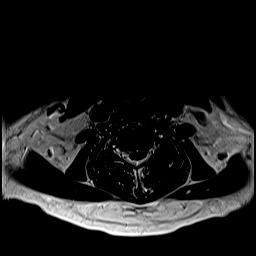
[im 31/42]
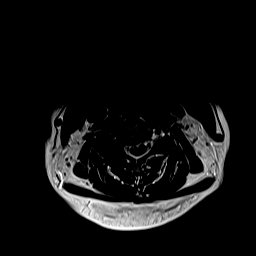
[im 36/42]
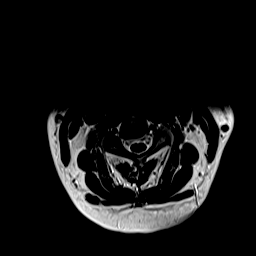
[im 42/42]
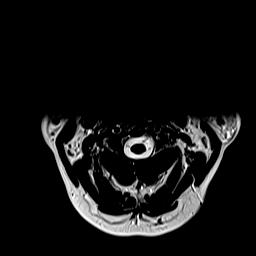

[Series 9: GRE · axial · 3.0mm · 0.78mm/px · z∈[-156,-33]mm · 8 of 42 slices shown]
[im 3/42]
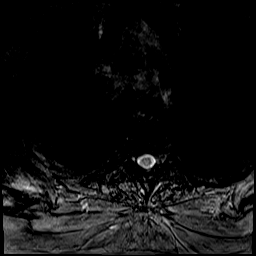
[im 9/42]
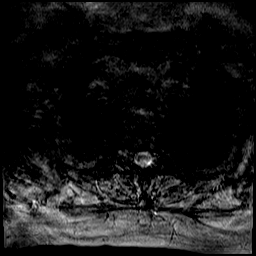
[im 14/42]
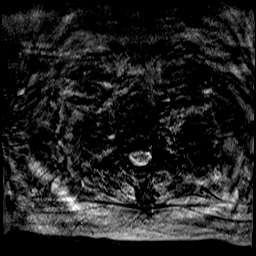
[im 20/42]
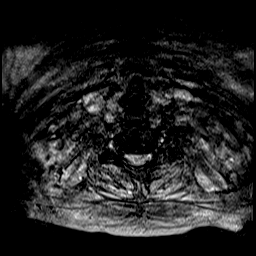
[im 25/42]
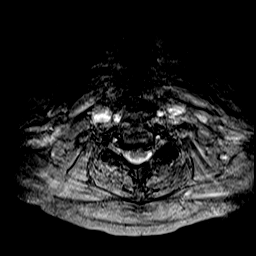
[im 31/42]
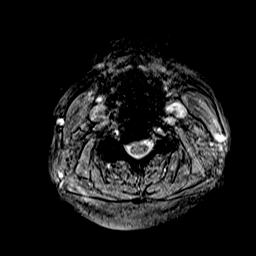
[im 36/42]
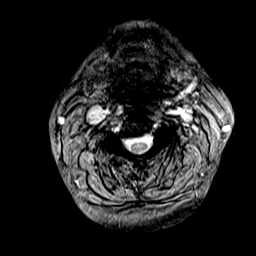
[im 42/42]
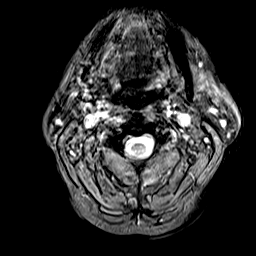

[36 of 48 positions shown; findings below may reference images not displayed]

FINDINGS: Alignment: Mild straightening and reversal of lower cervical
lordosis. Associated mild degenerative appearing anterolisthesis at
both C4-C5 and C5-C6. Underlying mild dextroconvex cervical
scoliosis.

Vertebrae: Chronic facet ankylosis at C3-C4. Lower cervical
degenerative endplate spurring. No marrow edema or evidence of acute
osseous abnormality. Background bone marrow signal within normal
limits.

Cord: No cervical spinal cord signal abnormality despite some
degenerative cord mass effect detailed below. Negative visible upper
thoracic spinal cord and canal.

Posterior Fossa, vertebral arteries, paraspinal tissues:
Cervicomedullary junction is within normal limits. Brain is reported
separately. Preserved major vascular flow voids in the neck.
Partially retropharyngeal course of the carotids. Negative visible
neck soft tissues and lung apices.

Disc levels:

C2-C3: Severe facet hypertrophy and sclerosis on the right. Moderate
right C3 neural foraminal stenosis.

C3-C4: Mild anterolisthesis with chronic facet ankylosis and
hypertrophy. No spinal stenosis. Mild to moderate right C4 foraminal
stenosis.

C4-C5: Mild anterolisthesis with mild left but severe right facet
hypertrophy. Rightward disc bulging and endplate spurring. Small
central component of disc protrusion and annular fissure (series 5,
image 9). No significant spinal stenosis. Moderate to severe right
C5 foraminal stenosis.

C5-C6: Mild anterolisthesis. Moderate bilateral facet hypertrophy.
Mostly foraminal disc bulging and endplate spurring. No spinal
stenosis. Mild to moderate left C6 foraminal stenosis.

C6-C7: Disc space loss and circumferential disc osteophyte complex.
Broad-based posterior component with mild spinal stenosis and
ventral cord mass effect. Mild to moderate bilateral C7 foraminal
stenosis.

C7-T1: Moderate facet hypertrophy greater on the left. Mild
foraminal disc bulging and endplate spurring. No spinal stenosis.
Mild to moderate C8 foraminal stenosis greater on the left.
IMPRESSION: 1. Chronic facet ankylosis at C3-C4, with multilevel facet
arthropathy and mild spondylolisthesis elsewhere in the cervical
spine. No acute osseous abnormality.
2. Maximal superimposed disc and endplate degeneration at C6-C7 with
mild spinal stenosis, mild ventral cord mass effect. No spinal cord
signal abnormality.
3. Moderate or severe neural foraminal stenosis at the right C3 and
right C5 nerve levels, with mild to moderate foraminal stenosis
otherwise.

## 2021-11-02 MED ORDER — GABAPENTIN 600 MG PO TABS: ORAL_TABLET | ORAL | 4 refills | Status: DC

## 2021-11-02 MED ORDER — GABAPENTIN 600 MG PO TABS: ORAL_TABLET | ORAL | 3 refills | Status: DC

## 2021-11-02 MED ORDER — GADOBUTROL 1 MMOL/ML IV SOLN
7.0000 mL | Freq: Once | INTRAVENOUS | Status: AC | PRN
  Administered 2021-11-02: 7 mL via INTRAVENOUS

## 2021-11-02 NOTE — Telephone Encounter (Signed)
gabapentin (NEURONTIN) 600 MG tablet  ?  Sig: One tab tid +2 extra tabs qhs.  ?  Dispense:  450 tablet  ?  Refill:  3  ?  ?

## 2021-11-03 DIAGNOSIS — M6281 Muscle weakness (generalized): Secondary | ICD-10-CM | POA: Diagnosis not present

## 2021-11-03 DIAGNOSIS — Z471 Aftercare following joint replacement surgery: Secondary | ICD-10-CM | POA: Diagnosis not present

## 2021-11-03 DIAGNOSIS — M25511 Pain in right shoulder: Secondary | ICD-10-CM | POA: Diagnosis not present

## 2021-11-04 ENCOUNTER — Other Ambulatory Visit: Payer: Self-pay

## 2021-11-04 ENCOUNTER — Telehealth (INDEPENDENT_AMBULATORY_CARE_PROVIDER_SITE_OTHER): Payer: BC Managed Care – PPO | Admitting: Family Medicine

## 2021-11-04 DIAGNOSIS — F419 Anxiety disorder, unspecified: Secondary | ICD-10-CM | POA: Diagnosis not present

## 2021-11-04 NOTE — Progress Notes (Signed)
Virtual Visit via Video Note ? ?I connected with [name] on 11/04/21 at  3:10 PM EDT by a video enabled telemedicine application and verified that I am speaking with the correct person using two identifiers. ? ?Location: ?Patient: [name] [name]  ?Provider: Donney Dice, DO ?  ?I discussed the limitations of evaluation and management by telemedicine and the availability of in person appointments. The patient expressed understanding and agreed to proceed. ? ?History of Present Illness: ?Patient works at a [employer], they have been out of the office since [place]. Now they are planning to have 2 days in the office. She states that she is "scared to death" of going back in that building. She is afraid of crowds and gets panic attacks just thinking about it. Has an appointment with a psychologist on 3/29 so would like to be able to hold off going to work until she sees the therapist. She was given a letter prior to allow her to work from home for 90 days but expires before her appointment with her therapist. She requests a letter to extend time working from home until she sees a Transport planner.  ?  ?Observations/Objective: ?General: Patient well-appearing, in no acute distress. ?Resp: normal work of breathing ?Psych: mildly anxious, very pleasant  ? ?Assessment and Plan: ?-Plan to see therapist on 3/29 ?-Letter provided  ?-Follow up with PCP as appropriate ?-Reassurance provided  ? ?Follow Up Instructions: ?-Follow up with PCP as appropriate  ?  ?I discussed the assessment and treatment plan with the patient. The patient was provided an opportunity to ask questions and all were answered. The patient agreed with the plan and demonstrated an understanding of the instructions. ?  ?The patient was advised to call back or seek an in-person evaluation if the symptoms worsen or if the condition fails to improve as anticipated. ? ?I provided 30 minutes of non-face-to-face time during this encounter. ? ? ?Donney Dice, DO ? ? ?

## 2021-11-05 DIAGNOSIS — M25511 Pain in right shoulder: Secondary | ICD-10-CM | POA: Diagnosis not present

## 2021-11-05 DIAGNOSIS — M6281 Muscle weakness (generalized): Secondary | ICD-10-CM | POA: Diagnosis not present

## 2021-11-05 DIAGNOSIS — Z471 Aftercare following joint replacement surgery: Secondary | ICD-10-CM | POA: Diagnosis not present

## 2021-11-10 ENCOUNTER — Other Ambulatory Visit: Payer: Self-pay | Admitting: Pulmonary Disease

## 2021-11-18 ENCOUNTER — Ambulatory Visit (INDEPENDENT_AMBULATORY_CARE_PROVIDER_SITE_OTHER): Payer: BC Managed Care – PPO | Admitting: Psychologist

## 2021-11-18 DIAGNOSIS — F410 Panic disorder [episodic paroxysmal anxiety] without agoraphobia: Secondary | ICD-10-CM | POA: Diagnosis not present

## 2021-11-18 NOTE — Plan of Care (Signed)

## 2021-11-18 NOTE — Progress Notes (Signed)
                Darreld Hoffer, PsyD 
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
## 2021-11-20 DIAGNOSIS — M6281 Muscle weakness (generalized): Secondary | ICD-10-CM | POA: Diagnosis not present

## 2021-11-20 DIAGNOSIS — M25511 Pain in right shoulder: Secondary | ICD-10-CM | POA: Diagnosis not present

## 2021-11-20 DIAGNOSIS — Z471 Aftercare following joint replacement surgery: Secondary | ICD-10-CM | POA: Diagnosis not present

## 2021-11-24 DIAGNOSIS — M6281 Muscle weakness (generalized): Secondary | ICD-10-CM | POA: Diagnosis not present

## 2021-11-24 DIAGNOSIS — Z471 Aftercare following joint replacement surgery: Secondary | ICD-10-CM | POA: Diagnosis not present

## 2021-11-24 DIAGNOSIS — M25511 Pain in right shoulder: Secondary | ICD-10-CM | POA: Diagnosis not present

## 2021-11-26 DIAGNOSIS — M6281 Muscle weakness (generalized): Secondary | ICD-10-CM | POA: Diagnosis not present

## 2021-11-26 DIAGNOSIS — Z471 Aftercare following joint replacement surgery: Secondary | ICD-10-CM | POA: Diagnosis not present

## 2021-11-26 DIAGNOSIS — M25511 Pain in right shoulder: Secondary | ICD-10-CM | POA: Diagnosis not present

## 2021-11-30 DIAGNOSIS — E7801 Familial hypercholesterolemia: Secondary | ICD-10-CM | POA: Diagnosis not present

## 2021-11-30 DIAGNOSIS — F429 Obsessive-compulsive disorder, unspecified: Secondary | ICD-10-CM | POA: Diagnosis not present

## 2021-11-30 DIAGNOSIS — G2581 Restless legs syndrome: Secondary | ICD-10-CM | POA: Diagnosis not present

## 2021-11-30 DIAGNOSIS — I10 Essential (primary) hypertension: Secondary | ICD-10-CM | POA: Diagnosis not present

## 2021-11-30 DIAGNOSIS — K219 Gastro-esophageal reflux disease without esophagitis: Secondary | ICD-10-CM | POA: Diagnosis not present

## 2021-12-01 DIAGNOSIS — M25511 Pain in right shoulder: Secondary | ICD-10-CM | POA: Diagnosis not present

## 2021-12-01 DIAGNOSIS — Z471 Aftercare following joint replacement surgery: Secondary | ICD-10-CM | POA: Diagnosis not present

## 2021-12-01 DIAGNOSIS — M6281 Muscle weakness (generalized): Secondary | ICD-10-CM | POA: Diagnosis not present

## 2021-12-03 DIAGNOSIS — M6281 Muscle weakness (generalized): Secondary | ICD-10-CM | POA: Diagnosis not present

## 2021-12-03 DIAGNOSIS — Z471 Aftercare following joint replacement surgery: Secondary | ICD-10-CM | POA: Diagnosis not present

## 2021-12-03 DIAGNOSIS — M25511 Pain in right shoulder: Secondary | ICD-10-CM | POA: Diagnosis not present

## 2021-12-04 ENCOUNTER — Ambulatory Visit: Payer: BC Managed Care – PPO | Admitting: Psychologist

## 2021-12-07 ENCOUNTER — Ambulatory Visit (INDEPENDENT_AMBULATORY_CARE_PROVIDER_SITE_OTHER): Payer: BC Managed Care – PPO | Admitting: Pulmonary Disease

## 2021-12-07 ENCOUNTER — Encounter: Payer: Self-pay | Admitting: Pulmonary Disease

## 2021-12-07 VITALS — BP 120/72 | HR 96 | Temp 98.4°F | Ht 69.0 in | Wt 233.0 lb

## 2021-12-07 DIAGNOSIS — G2581 Restless legs syndrome: Secondary | ICD-10-CM | POA: Diagnosis not present

## 2021-12-07 DIAGNOSIS — G4733 Obstructive sleep apnea (adult) (pediatric): Secondary | ICD-10-CM

## 2021-12-07 MED ORDER — TRELEGY ELLIPTA 200-62.5-25 MCG/ACT IN AEPB: 1.0000 | INHALATION_SPRAY | Freq: Every day | RESPIRATORY_TRACT | 0 refills | Status: DC

## 2021-12-07 MED ORDER — TRELEGY ELLIPTA 200-62.5-25 MCG/ACT IN AEPB: 1.0000 | INHALATION_SPRAY | Freq: Every day | RESPIRATORY_TRACT | 5 refills | Status: DC

## 2021-12-07 NOTE — Progress Notes (Signed)
? ?      ?Jennier Schissler    [id number]    September 30, 1955 ? ?Primary Care Physician:Simmons-Robinson, Riki Sheer, MD ? ?Referring Physician: [name], MD ?[address] ?[place],  [place] [address] ? ?Chief complaint:   ?Patient is being followed up for COPD, obstructive sleep apnea ? ?HPI: ? ?COPD diagnosed about 2017  ?-On Advair, Spiriva ?-As needed albuterol ?-Remains compliant with inhalers  ?-no recent exacerbation ? ? ?Restless leg syndrome ?-On Requip ?-On gabapentin 600 mg up to 5 times a day, this is an increase from previous ?-Symptoms are better controlled ?-Requip 4 mg nightly ? ?Obstructive sleep apnea was diagnosed in 2017 ?Was not able to get used to using CPAP on a regular basis ?Still not able to get used to CPAP ?She does have an appointment to see a dentist ?-Appointment is coming up in the next couple of weeks ?-Snoring, daytime sleepiness ? ?Recent CT scan of the chest for lung nodules-stable ? ?Quit smoking about 2010 ? ?No pertinent occupational history ? ?Outpatient Encounter Medications as of 12/07/2021  ?Medication Sig  ? ADVAIR HFA 230-21 MCG/ACT inhaler INHALE 2 PUFFS INTO THE LUNGS TWICE DAILY  ? albuterol (VENTOLIN HFA) 108 (90 Base) MCG/ACT inhaler Inhale 2 puffs into the lungs every 6 (six) hours as needed for wheezing or shortness of breath.  ? citalopram (CELEXA) 40 MG tablet Take 40 mg by mouth daily.  ? clonazePAM (KLONOPIN) 0.5 MG tablet Take 1 tablet (0.5 mg total) by mouth at bedtime as needed for anxiety.  ? gabapentin (NEURONTIN) 600 MG tablet One tab tid +2 extra tabs qhs. (Patient taking differently: 600 mg 5 (five) times daily. One tab tid +2 extra tabs qhs.)  ? omeprazole (PRILOSEC) 20 MG capsule Take by mouth.  ? rOPINIRole (REQUIP) 4 MG tablet Take 1 tablet (4 mg total) by mouth at bedtime.  ? rosuvastatin (CRESTOR) 5 MG tablet Take 1 tablet (5 mg total) by mouth daily.  ? SPIRIVA RESPIMAT 2.5 MCG/ACT AERS INHALE 2 PUFFS INTO THE LUNGS DAILY  ? ?No  facility-administered encounter medications on file as of 12/07/2021.  ? ? ?Allergies as of 12/07/2021 - Review Complete 12/07/2021  ?Allergen Reaction Noted  ? Mobic [meloxicam] Other (See Comments) 11/27/2020  ? ? ?Past Medical History:  ?Diagnosis Date  ? COPD (chronic obstructive pulmonary disease) ([place])   ? Hard of hearing   ? OCD (obsessive compulsive disorder)   ? Restless legs syndrome (RLS)   ? Sleep apnea   ? ? ?Past Surgical History:  ?Procedure Laterality Date  ? ABDOMINAL HYSTERECTOMY    ? [place]  ? FRACTURE SURGERY    ? foot  ? JOINT REPLACEMENT    ? left shoulder  ? JOINT REPLACEMENT    ? right shoulder  ? ? ?Family History  ?Problem Relation Age of Onset  ? Alzheimer's disease Mother   ? Heart attack Father   ? ? ?Social History  ? ?Socioeconomic History  ? Marital status: Single  ?  Spouse name: Not on file  ? Number of children: 2  ? Years of education: college  ? Highest education level: Not on file  ?Occupational History  ? Occupation: Press photographer Rep  ?Tobacco Use  ? Smoking status: Never  ? Smokeless tobacco: Never  ?Substance and Sexual Activity  ? Alcohol use: Yes  ?  Comment: Occasional  ? Drug use: Yes  ?  Types: Marijuana  ? Sexual activity: Not on file  ?Other  Topics Concern  ? Not on file  ?Social History Narrative  ? Lives alone.  ? Caffeine use: occasional use  ? Right-handed.  ? ?Social Determinants of Health  ? ?Financial Resource Strain: Not on file  ?Food Insecurity: Not on file  ?Transportation Needs: Not on file  ?Physical Activity: Not on file  ?Stress: Not on file  ?Social Connections: Not on file  ?Intimate Partner Violence: Not on file  ? ? ?Review of Systems  ?Constitutional:  Positive for fatigue.  ?Respiratory:  Positive for apnea.   ?Psychiatric/Behavioral:  Positive for sleep disturbance.   ? ?Vitals:  ? 12/07/21 1550  ?BP: 120/72  ?Pulse: 96  ?Temp: 98.4 ?F (36.9 ?C)  ?SpO2: 97%  ? ? ? ?Physical Exam ?Constitutional:   ?   Appearance: She is obese.  ?HENT:  ?    Head: Normocephalic.  ?   Nose: No congestion.  ?   Mouth/Throat:  ?   Mouth: Mucous membranes are moist.  ?Eyes:  ?   Pupils: Pupils are equal, round, and reactive to light.  ?Cardiovascular:  ?   Rate and Rhythm: Normal rate and regular rhythm.  ?   Pulses: Normal pulses.  ?   Heart sounds: No murmur heard. ?  No friction rub.  ?Pulmonary:  ?   Effort: No respiratory distress.  ?   Breath sounds: No stridor. No wheezing or rhonchi.  ?Musculoskeletal:  ?   Cervical back: No rigidity or tenderness.  ?Neurological:  ?   Mental Status: She is alert.  ?Psychiatric:     ?   Mood and Affect: Mood normal.  ? ? ?Data Reviewed: ?Previous study not available ?Last sleep study was 07/06/2021 showed mild obstructive sleep apnea ? ? ?Assessment:  ?Obstructive sleep apnea ?-Intolerant of CPAP at present ?-Has an appointment to see dentist for possible oral device ? ?Chronic obstructive pulmonary disease ?-Encouraged to continue Advair and Spiriva ?-Will try Trelegy 200,-prescription provided ? ?Restless legs ?-Dose of Neurontin increased to 600 up to 5 times a day ?-Continues on Requip 4 mg ? ? ?Plan/Recommendations: ?Trial with Trelegy 200 ?-Sample provided ? ?Continue Neurontin ?Has needed to continue Requip as well ? ?Has been doing relatively well with respect to symptoms, not feeling more fatigued with Requip and use of Neurontin ? ?Follow-up in 6 months ? ? ? ?Records from care everywhere 10/02/2020 reviewed-saw Dr. Lavonda Jumbo ?-Obstructive sleep apnea with AHI of 14, RDI of 27, oxygen nadir of 87% ?-Titrated to a CPAP and subsequently to a BiPAP of 20/16 ? ?-Lung nodules-stable nodules with resolution of nodules ? ?-Had been prescribed Neurontin 100 mg at the time with Requip, and the Neupro patch ?-There had been discussion at the time about referral for discussion regarding an inspire device ? ?Sherrilyn Rist MD ?[facility] ?12/07/2021, 3:59 PM ? ?CC: Simmons-Robinson, Makie* ? ? ? ?

## 2021-12-07 NOTE — Patient Instructions (Signed)
Prescription for Trelegy will be sent to pharmacy for you ?Use the sample as we discussed 1 puff daily, rinse your mouth afterwards ?This will replace Advair and Spiriva ? ?If well-tolerated, filled the prescription, if not well-tolerated, go back to Advair and Spiriva ? ?Continue other lines of treatment ? ?Hopefully, an oral device can be fashioned that works well for your sleep apnea ? ?I will see you in 6 months ?

## 2021-12-10 DIAGNOSIS — Z471 Aftercare following joint replacement surgery: Secondary | ICD-10-CM | POA: Diagnosis not present

## 2021-12-10 DIAGNOSIS — M25511 Pain in right shoulder: Secondary | ICD-10-CM | POA: Diagnosis not present

## 2021-12-10 DIAGNOSIS — M6281 Muscle weakness (generalized): Secondary | ICD-10-CM | POA: Diagnosis not present

## 2021-12-15 DIAGNOSIS — M25511 Pain in right shoulder: Secondary | ICD-10-CM | POA: Diagnosis not present

## 2021-12-15 DIAGNOSIS — M6281 Muscle weakness (generalized): Secondary | ICD-10-CM | POA: Diagnosis not present

## 2021-12-15 DIAGNOSIS — Z471 Aftercare following joint replacement surgery: Secondary | ICD-10-CM | POA: Diagnosis not present

## 2021-12-17 DIAGNOSIS — M6281 Muscle weakness (generalized): Secondary | ICD-10-CM | POA: Diagnosis not present

## 2021-12-17 DIAGNOSIS — M25511 Pain in right shoulder: Secondary | ICD-10-CM | POA: Diagnosis not present

## 2021-12-17 DIAGNOSIS — Z471 Aftercare following joint replacement surgery: Secondary | ICD-10-CM | POA: Diagnosis not present

## 2021-12-21 ENCOUNTER — Ambulatory Visit: Payer: BC Managed Care – PPO | Admitting: Psychologist

## 2021-12-22 DIAGNOSIS — M25511 Pain in right shoulder: Secondary | ICD-10-CM | POA: Diagnosis not present

## 2021-12-22 DIAGNOSIS — Z471 Aftercare following joint replacement surgery: Secondary | ICD-10-CM | POA: Diagnosis not present

## 2021-12-22 DIAGNOSIS — M6281 Muscle weakness (generalized): Secondary | ICD-10-CM | POA: Diagnosis not present

## 2021-12-23 ENCOUNTER — Other Ambulatory Visit: Payer: Self-pay | Admitting: Pulmonary Disease

## 2021-12-24 ENCOUNTER — Other Ambulatory Visit: Payer: Self-pay | Admitting: Pulmonary Disease

## 2021-12-24 DIAGNOSIS — Z1231 Encounter for screening mammogram for malignant neoplasm of breast: Secondary | ICD-10-CM | POA: Diagnosis not present

## 2021-12-24 DIAGNOSIS — Z471 Aftercare following joint replacement surgery: Secondary | ICD-10-CM | POA: Diagnosis not present

## 2021-12-24 DIAGNOSIS — M25511 Pain in right shoulder: Secondary | ICD-10-CM | POA: Diagnosis not present

## 2021-12-24 DIAGNOSIS — M6281 Muscle weakness (generalized): Secondary | ICD-10-CM | POA: Diagnosis not present

## 2022-01-08 DIAGNOSIS — M19011 Primary osteoarthritis, right shoulder: Secondary | ICD-10-CM | POA: Diagnosis not present

## 2022-01-11 ENCOUNTER — Telehealth: Payer: Self-pay | Admitting: Neurology

## 2022-01-11 ENCOUNTER — Telehealth: Payer: Self-pay

## 2022-01-11 ENCOUNTER — Encounter: Payer: Self-pay | Admitting: Neurology

## 2022-01-11 ENCOUNTER — Ambulatory Visit (INDEPENDENT_AMBULATORY_CARE_PROVIDER_SITE_OTHER): Payer: BC Managed Care – PPO | Admitting: Neurology

## 2022-01-11 VITALS — BP 119/65 | HR 75 | Ht 69.5 in | Wt 227.0 lb

## 2022-01-11 DIAGNOSIS — H903 Sensorineural hearing loss, bilateral: Secondary | ICD-10-CM

## 2022-01-11 DIAGNOSIS — D649 Anemia, unspecified: Secondary | ICD-10-CM

## 2022-01-11 DIAGNOSIS — E538 Deficiency of other specified B group vitamins: Secondary | ICD-10-CM

## 2022-01-11 DIAGNOSIS — G2581 Restless legs syndrome: Secondary | ICD-10-CM

## 2022-01-11 MED ORDER — "BD LUER-LOK SYRINGE 18G X 1-1/2"" 3 ML MISC": 0 refills | Status: AC

## 2022-01-11 MED ORDER — "BD DISP NEEDLE 23G X 1"" MISC": 0 refills | Status: AC

## 2022-01-11 MED ORDER — NEUPRO 2 MG/24HR TD PT24: 1.0000 | MEDICATED_PATCH | Freq: Every day | TRANSDERMAL | 12 refills | Status: DC

## 2022-01-11 MED ORDER — CYANOCOBALAMIN 1000 MCG/ML IJ SOLN: INTRAMUSCULAR | 0 refills | Status: DC

## 2022-01-11 NOTE — Progress Notes (Signed)
Chief Complaint  Patient presents with   Follow-up    Rm 17. Alone. Pt states restless leg symptoms have improved.      ASSESSMENT AND PLAN  [name] is a 66 y.o. female   Severe restless leg syndrome with augmentation  Much improved with medication adjustment, but still has difficult night,  Will add on low-dose Neupro patch 2 g / 24 hours,  Keep Requip 4 mg, before bedtime, gabapentin 600 mg up to 5 tablets a day  Anemia, hemoglobin of 10.8, ferritin of 17, suggest iron supplement,  Patient reported that she never had recommended colonoscopy, suggestive of follow-up with primary care, potential GI physician  Periodic leg movement disorder, snoring, excessive daytime sleepiness, fatigue  Referred for sleep study    Elevated M protein, level was 1.7  We will repeat laboratory evaluations  B12 deficiency  Needs B12 IM supplement  DIAGNOSTIC DATA (LABS, IMAGING, TESTING) - I reviewed patient records, labs, notes, testing and imaging myself where available.  Laboratory evaluations in October 2022,  B12 255, ESR C-reactive protein rheumatoid factor, vitamin D 34, normal CMP    MEDICAL HISTORY:  [name] [name], is a 66 year old female, seen in request by Dr. Andrena Mews T, for evaluation of restless leg syndrome, her primary care physician is Dr. [name], MD, she is accompanied by her friend Corey Skains at today's visit on October 09, 2021  I reviewed and summarized the referring note. PMHx COPD, smoker in the past OCD HLD GERD Obstructive sleep apnea, use CPAP Bilateral shoulder replacement,  right Dec 2022, Nov 2021  She reported restless leg syndrome since 1995, initially she has the urge to kick her leg, move her leg when sitting still, if she turns her neck in certain way it will help,  Eventually she was treated with Requip, low-dose at the beginning, gradually titrating to 4 mg every night around 2019, it used to be able to control her  restless leg symptoms well  But she began to experience increased restless leg syndrome around 2020, especially since 2022, she is now taking Requip 4 mg and the gabapentin 300 mg at 6 PM, another gabapentin 1 to 200 mg during the day,   She complains such severe restless leg symptoms, she has to carve herself in a fetal position put pressure on her knee and lacks to falling to sleep,  She also have significant periodic leg movement disorder, woke up oftentimes her bed has moved few inches away from its original position, when she had bilateral shoulder replacement, she lived with her friends, who observed her almost constant bilateral neck, arm movement during her sleep, sometimes abnormal movements will wake her up  She also has to get out of the car couple times if they drive for 4 hours, she cannot even finish a meal with her friend without pacing around  She denies persistent left lower extremity sensory or motor deficit, denies gait abnormality   Over the past few years, she also developed gradual worsening hearing loss, never had a imaging study of her brain  She also has neck pain, radiating pain to bilateral shoulder despite bilateral shoulder replacements surgery  UPDATE Jan 11 2022: She reported significant improvement with adjustment of her medications, she is now taking gabapentin 600 mg up to 5 tablets a day, Requip 4 mg at 5:30 PM, occasionally clonazepam  Laboratory evaluations showed normal negative copper, RPR, CPK, mild anemia, hemoglobin of 10.8, ferritin level was 17, M protein was 1.7,  elevated, negative ANA, A1c was 5.8, B12 was low 177, normal C-reactive protein, TSH,  She does complains of excessive daytime sleepiness, snoring,  Personally reviewed MRI of brain with and without contrast November 03, 2021 that was normal Cervical spine showed multilevel degenerative changes, no significant canal stenosis, right C3 and C5 moderate to severe foraminal narrowing     PHYSICAL EXAM:   Vitals:   01/11/22 1509  BP: 119/65  Pulse: 75  Weight: 227 lb (103 kg)  Height: 5' 9.5" (1.765 m)      Body mass index is 33.04 kg/m.  PHYSICAL EXAMNIATION:  Gen: NAD, conversant, well nourised, well groomed                     Cardiovascular: Regular rate rhythm, no peripheral edema, warm, nontender. Eyes: Conjunctivae clear without exudates or hemorrhage Neck: Supple, no carotid bruits. Pulmonary: Clear to auscultation bilaterally   NEUROLOGICAL EXAM:  MENTAL STATUS: Speech/cognition: Awake, alert, oriented to history taking and casual conversation   CRANIAL NERVES: CN II: Visual fields are full to confrontation. Pupils are round equal and briskly reactive to light. CN III, IV, VI: extraocular movement are normal. No ptosis. CN V: Facial sensation is intact to light touch CN VII: Face is symmetric with normal eye closure  CN VIII: Hard of hearing, air conduction more than bony conduction bilaterally, Weber signs in the midline CN IX, X: Phonation is normal. CN XI: Head turning and shoulder shrug are intact  MOTOR: There is no pronator drift of out-stretched arms. Muscle bulk and tone are normal. Muscle strength is normal.  REFLEXES: Reflexes are 2+ and symmetric at the biceps, triceps, 3/3 knees, and ankles. Plantar responses are extensor bilaterally  SENSORY: Decreased vibratory sensation at toes, preserved fine touch, pinprick  COORDINATION: There is no trunk or limb dysmetria noted.  GAIT/STANCE: Posture is normal. Gait is steady with normal steps, base, arm swing, and turning. Heel and toe walking are normal. Tandem gait is normal.  Romberg is absent.  REVIEW OF SYSTEMS:  Full 14 system review of systems performed and notable only for as above All other review of systems were negative.   ALLERGIES: Allergies  Allergen Reactions   Mobic [Meloxicam] Other (See Comments)    HOME MEDICATIONS: Current Outpatient Medications   Medication Sig Dispense Refill   albuterol (VENTOLIN HFA) 108 (90 Base) MCG/ACT inhaler Inhale 2 puffs into the lungs every 6 (six) hours as needed for wheezing or shortness of breath. 8 g 6   citalopram (CELEXA) 40 MG tablet Take 40 mg by mouth daily.     clonazePAM (KLONOPIN) 0.5 MG tablet Take 1 tablet (0.5 mg total) by mouth at bedtime as needed for anxiety. 30 tablet 5   Fluticasone-Umeclidin-Vilant (TRELEGY ELLIPTA) 200-62.5-25 MCG/ACT AEPB Inhale 1 puff into the lungs daily. 60 each 5   Fluticasone-Umeclidin-Vilant (TRELEGY ELLIPTA) 200-62.5-25 MCG/ACT AEPB Inhale 1 puff into the lungs daily. 1 each 0   gabapentin (NEURONTIN) 600 MG tablet One tab tid +2 extra tabs qhs. (Patient taking differently: 600 mg 5 (five) times daily. One tab tid +2 extra tabs qhs.) 450 tablet 3   omeprazole (PRILOSEC) 20 MG capsule Take by mouth.     rOPINIRole (REQUIP) 4 MG tablet Take 1 tablet (4 mg total) by mouth at bedtime. 90 tablet 3   rosuvastatin (CRESTOR) 5 MG tablet Take 1 tablet (5 mg total) by mouth daily. 90 tablet 3   No current facility-administered medications for this visit.      PAST MEDICAL HISTORY: Past Medical History:  Diagnosis Date   COPD (chronic obstructive pulmonary disease) ([facility])    Hard of hearing    OCD (obsessive compulsive disorder)    Restless legs syndrome (RLS)    Sleep apnea     PAST SURGICAL HISTORY: Past Surgical History:  Procedure Laterality Date   ABDOMINAL HYSTERECTOMY     CESAREAN SECTION  1996   FRACTURE SURGERY     foot   JOINT REPLACEMENT     left shoulder   JOINT REPLACEMENT     right shoulder    FAMILY HISTORY: Family History  Problem Relation Age of Onset   Alzheimer's disease Mother    Heart attack Father     SOCIAL HISTORY: Social History   Socioeconomic History   Marital status: Single    Spouse name: Not on file   Number of children: 2   Years of education: college   Highest education level: Not on file  Occupational History    Occupation: Press photographer Rep  Tobacco Use   Smoking status: Never   Smokeless tobacco: Never  Substance and Sexual Activity   Alcohol use: Yes    Comment: Occasional   Drug use: Yes    Types: Marijuana   Sexual activity: Not on file  Other Topics Concern   Not on file  Social History Narrative   Lives alone.   Caffeine use: occasional use   Right-handed.   Social Determinants of Health   Financial Resource Strain: Not on file  Food Insecurity: Not on file  Transportation Needs: Not on file  Physical Activity: Not on file  Stress: Not on file  Social Connections: Not on file  Intimate Partner Violence: Not on file      Marcial Pacas, M.D. Ph.D.  [facility] [address], [place], [place] [address] Ph: [phone] Fax: [phone]  CC:  [name], [place] [address] [place],  [place] [address]  [name], MD

## 2022-01-11 NOTE — Telephone Encounter (Signed)
Please advise patient come to clinic for repeat laboratory evaluation, no appointment needed,

## 2022-01-11 NOTE — Patient Instructions (Signed)
.  Chief Complaint  Patient presents with   Follow-up    Rm 17. Alone. Pt states restless leg symptoms have improved.      ASSESSMENT AND PLAN  [name] [name] is a 66 y.o. female     DIAGNOSTIC DATA (LABS, IMAGING, TESTING) - I reviewed patient records, labs, notes, testing and imaging myself where available.   MEDICAL HISTORY:  [name], seen in request by   [name], [place] [address] [place],  [place] [address], [name], MD   I reviewed and summarized the referring note. Past Medical History:  Diagnosis Date   COPD (chronic obstructive pulmonary disease) (HCC)    Hard of hearing    OCD (obsessive compulsive disorder)    Restless legs syndrome (RLS)    Sleep apnea    Past Surgical History:  Procedure Laterality Date   ABDOMINAL HYSTERECTOMY     CESAREAN SECTION  1996   FRACTURE SURGERY     foot   JOINT REPLACEMENT     left shoulder   JOINT REPLACEMENT     right shoulder     PHYSICAL EXAM:   Vitals:   01/11/22 1509  BP: 119/65  Pulse: 75  Weight: 227 lb (103 kg)  Height: 5' 9.5" (1.765 m)   '@OPHVA'$ @  Body mass index is 33.04 kg/m.  PHYSICAL EXAMNIATION:  Gen: NAD, conversant, well nourised, well groomed                     Cardiovascular: Regular rate rhythm, no peripheral edema, warm, nontender. Eyes: Conjunctivae clear without exudates or hemorrhage Neck: Supple, no carotid bruits. Pulmonary: Clear to auscultation bilaterally   NEUROLOGICAL EXAM:  MENTAL STATUS: Speech/cognition: Awake, alert, oriented to history taking and casual conversation CRANIAL NERVES: CN II: Visual fields are full to confrontation. Pupils are round equal and briskly reactive to light. CN III, IV, VI: extraocular movement are normal. No ptosis. CN V: Facial sensation is intact to light touch CN VII: Face is symmetric with normal eye closure  CN VIII: Hearing is normal to causal conversation. CN IX, X: Phonation is  normal. CN XI: Head turning and shoulder shrug are intact  MOTOR: There is no pronator drift of out-stretched arms. Muscle bulk and tone are normal. Muscle strength is normal.  REFLEXES: Reflexes are 2+ and symmetric at the biceps, triceps, knees, and ankles. Plantar responses are flexor.  SENSORY: Intact to light touch, pinprick and vibratory sensation are intact in fingers and toes.  COORDINATION: There is no trunk or limb dysmetria noted.  GAIT/STANCE: Posture is normal. Gait is steady with normal steps, base, arm swing, and turning. Heel and toe walking are normal. Tandem gait is normal.  Romberg is absent.  REVIEW OF SYSTEMS:  Full 14 system review of systems performed and notable only for as above All other review of systems were negative.   ALLERGIES: Allergies  Allergen Reactions   Mobic [Meloxicam] Other (See Comments)    HOME MEDICATIONS: Current Outpatient Medications  Medication Sig Dispense Refill   albuterol (VENTOLIN HFA) 108 (90 Base) MCG/ACT inhaler Inhale 2 puffs into the lungs every 6 (six) hours as needed for wheezing or shortness of breath. 8 g 6   citalopram (CELEXA) 40 MG tablet Take 40 mg by mouth daily.     clonazePAM (KLONOPIN) 0.5 MG tablet Take 1 tablet (0.5 mg total) by mouth at bedtime as needed for anxiety. 30 tablet 5   Fluticasone-Umeclidin-Vilant (TRELEGY ELLIPTA) 200-62.5-25 MCG/ACT  AEPB Inhale 1 puff into the lungs daily. 60 each 5   Fluticasone-Umeclidin-Vilant (TRELEGY ELLIPTA) 200-62.5-25 MCG/ACT AEPB Inhale 1 puff into the lungs daily. 1 each 0   gabapentin (NEURONTIN) 600 MG tablet One tab tid +2 extra tabs qhs. (Patient taking differently: 600 mg 5 (five) times daily. One tab tid +2 extra tabs qhs.) 450 tablet 3   omeprazole (PRILOSEC) 20 MG capsule Take by mouth.     rOPINIRole (REQUIP) 4 MG tablet Take 1 tablet (4 mg total) by mouth at bedtime. 90 tablet 3   rosuvastatin (CRESTOR) 5 MG tablet Take 1 tablet (5 mg total) by mouth  daily. 90 tablet 3   rotigotine (NEUPRO) 2 MG/24HR Place 1 patch onto the skin daily. 30 patch 12   No current facility-administered medications for this visit.    PAST MEDICAL HISTORY: Past Medical History:  Diagnosis Date   COPD (chronic obstructive pulmonary disease) (HCC)    Hard of hearing    OCD (obsessive compulsive disorder)    Restless legs syndrome (RLS)    Sleep apnea     PAST SURGICAL HISTORY: Past Surgical History:  Procedure Laterality Date   ABDOMINAL HYSTERECTOMY     CESAREAN SECTION  1996   FRACTURE SURGERY     foot   JOINT REPLACEMENT     left shoulder   JOINT REPLACEMENT     right shoulder    FAMILY HISTORY: Family History  Problem Relation Age of Onset   Alzheimer's disease Mother    Heart attack Father     SOCIAL HISTORY: Social History   Socioeconomic History   Marital status: Single    Spouse name: Not on file   Number of children: 2   Years of education: college   Highest education level: Not on file  Occupational History   Occupation: Press photographer Rep  Tobacco Use   Smoking status: Never   Smokeless tobacco: Never  Substance and Sexual Activity   Alcohol use: Yes    Comment: Occasional   Drug use: Yes    Types: Marijuana   Sexual activity: Not on file  Other Topics Concern   Not on file  Social History Narrative   Lives alone.   Caffeine use: occasional use   Right-handed.   Social Determinants of Health   Financial Resource Strain: Not on file  Food Insecurity: Not on file  Transportation Needs: Not on file  Physical Activity: Not on file  Stress: Not on file  Social Connections: Not on file  Intimate Partner Violence: Not on file      Marcial Pacas, M.D. Ph.D.  [facility] [address], [place], [place] [address] Ph: [phone] Fax: [phone]  CC:  [name], [place] [address] [place],  [place] [address]  [name], MD

## 2022-01-11 NOTE — Telephone Encounter (Signed)
Referral for Sleep Studies sent to [facility] [phone].

## 2022-01-11 NOTE — Telephone Encounter (Signed)
Orders given from MD for B12 injection  Pt verified by name and DOB Explain procedure to pt, ketorolac '60mg'$  given in the RUOQ. Pt advised to wait 15 min's after injection  Pt tolerated well

## 2022-01-26 ENCOUNTER — Encounter: Payer: Self-pay | Admitting: *Deleted

## 2022-02-01 DIAGNOSIS — E7800 Pure hypercholesterolemia, unspecified: Secondary | ICD-10-CM | POA: Diagnosis not present

## 2022-02-01 DIAGNOSIS — Z13220 Encounter for screening for lipoid disorders: Secondary | ICD-10-CM | POA: Diagnosis not present

## 2022-02-01 DIAGNOSIS — G2581 Restless legs syndrome: Secondary | ICD-10-CM | POA: Diagnosis not present

## 2022-02-01 DIAGNOSIS — R739 Hyperglycemia, unspecified: Secondary | ICD-10-CM | POA: Diagnosis not present

## 2022-02-01 DIAGNOSIS — K219 Gastro-esophageal reflux disease without esophagitis: Secondary | ICD-10-CM | POA: Diagnosis not present

## 2022-02-01 DIAGNOSIS — E7801 Familial hypercholesterolemia: Secondary | ICD-10-CM | POA: Diagnosis not present

## 2022-02-01 DIAGNOSIS — F429 Obsessive-compulsive disorder, unspecified: Secondary | ICD-10-CM | POA: Diagnosis not present

## 2022-02-03 ENCOUNTER — Other Ambulatory Visit: Payer: Self-pay | Admitting: Pulmonary Disease

## 2022-02-12 ENCOUNTER — Other Ambulatory Visit: Payer: Self-pay | Admitting: Family Medicine

## 2022-02-12 DIAGNOSIS — G4733 Obstructive sleep apnea (adult) (pediatric): Secondary | ICD-10-CM | POA: Diagnosis not present

## 2022-03-03 DIAGNOSIS — J449 Chronic obstructive pulmonary disease, unspecified: Secondary | ICD-10-CM | POA: Diagnosis not present

## 2022-03-03 DIAGNOSIS — K529 Noninfective gastroenteritis and colitis, unspecified: Secondary | ICD-10-CM | POA: Diagnosis not present

## 2022-03-03 DIAGNOSIS — I700 Atherosclerosis of aorta: Secondary | ICD-10-CM | POA: Diagnosis not present

## 2022-03-03 DIAGNOSIS — R1011 Right upper quadrant pain: Secondary | ICD-10-CM | POA: Diagnosis not present

## 2022-03-04 DIAGNOSIS — Z6833 Body mass index (BMI) 33.0-33.9, adult: Secondary | ICD-10-CM | POA: Diagnosis not present

## 2022-03-04 DIAGNOSIS — R109 Unspecified abdominal pain: Secondary | ICD-10-CM | POA: Diagnosis not present

## 2022-03-04 DIAGNOSIS — R112 Nausea with vomiting, unspecified: Secondary | ICD-10-CM | POA: Diagnosis not present

## 2022-03-10 DIAGNOSIS — R1011 Right upper quadrant pain: Secondary | ICD-10-CM | POA: Diagnosis not present

## 2022-03-10 DIAGNOSIS — Z6832 Body mass index (BMI) 32.0-32.9, adult: Secondary | ICD-10-CM | POA: Diagnosis not present

## 2022-03-11 DIAGNOSIS — R1011 Right upper quadrant pain: Secondary | ICD-10-CM | POA: Diagnosis not present

## 2022-03-11 DIAGNOSIS — Z6832 Body mass index (BMI) 32.0-32.9, adult: Secondary | ICD-10-CM | POA: Diagnosis not present

## 2022-03-15 DIAGNOSIS — R197 Diarrhea, unspecified: Secondary | ICD-10-CM | POA: Diagnosis not present

## 2022-03-16 ENCOUNTER — Encounter (INDEPENDENT_AMBULATORY_CARE_PROVIDER_SITE_OTHER): Payer: Self-pay | Admitting: Gastroenterology

## 2022-03-16 ENCOUNTER — Ambulatory Visit (INDEPENDENT_AMBULATORY_CARE_PROVIDER_SITE_OTHER): Payer: BC Managed Care – PPO | Admitting: Gastroenterology

## 2022-03-16 VITALS — BP 114/54 | HR 80 | Temp 98.5°F | Ht 69.5 in | Wt 224.8 lb

## 2022-03-16 DIAGNOSIS — R112 Nausea with vomiting, unspecified: Secondary | ICD-10-CM | POA: Diagnosis not present

## 2022-03-16 DIAGNOSIS — R1011 Right upper quadrant pain: Secondary | ICD-10-CM | POA: Diagnosis not present

## 2022-03-16 DIAGNOSIS — K9049 Malabsorption due to intolerance, not elsewhere classified: Secondary | ICD-10-CM | POA: Diagnosis not present

## 2022-03-16 NOTE — Patient Instructions (Signed)
We will get you set up for HIDA scan for further evaluation of your gallbladder For now, please make sure you are staying well hydrated, eating bland, non greasy, non spicy foods If you develop fevers, chills or vomiting that is uncontrolled with medication, please make me aware

## 2022-03-16 NOTE — Progress Notes (Signed)
Referring Provider: Precious Gilding, DO Primary Care Physician:  Precious Gilding, DO Primary GI Physician: new  Chief Complaint  Patient presents with   Emesis    New patient. Arrives with friend Corey Skains. Having pain in RUQ, vomiting and diarrhea. Started 2 -3 weeks ago.    HPI:   [name] [name] is a 66 y.o. female with past medical history of COPD, OCD, RLS, Sleep apnea  Patient presenting today as a new patient for RUQ pain, vomiting and diarrhea.   History: RUQ US done 03/03/22 with no acute abnormality, mild,increased echogenicity of liver without lesions, likely hepatic steatosis.   CT A/P with contrast on 03/03/22 with no acute abnormalities  Labs 03/03/22  WBC 15.9, lipase 185, potassium 3.4, creat 1.12, LFTs WNL.  Present: Seen in ED at [facility] on 7/12 for RUQ pain, nausea, vomiting, diarrhea. She reports two episodes of syncope while sitting on the toilet and a pre syncopal episode when arriving to the ED. Patient reports RUQ pain that came on suddenly, dull in nature, occurring first on the Friday after July 4th, she reports she had fried oysters prior to pain starting. She drinks etoh only occasionally, denied any alcohol prior to symptoms beginning. Sometimes pain Is worse, other times it may almost subside. She has intermittent nausea with some occasional vomiting. She is getting hungry, pain is worse with certain foods, she ate barbecue Sunday which made pain worse. She reports that she feels like if she could stretch pain would be better. Pain sometimes radiates to her Right upper back. Denies pain in shoulders. She has had this pain previously, but occurred very intermittently then. She is having intermittent diarrhea alternating with more formed stools, last BM was Sunday which was very soft after eating barbecue. Denies rectal bleeding or melena. Denies fevers or chills.  Did stool studies at [facility] earlier today, C diff and Fecal WBC were negative.   Saw Dr. Ladona Horns on 7/19 for  possible cholecystitis, however, he felt that symptoms likely were not biliary related but more of a gastroenteritis.   NSAID use: only on occasion  Social WC:BJSEGB on occasion, no tobacco  Fam hx: no crc or liver disease   Last Colonoscopy: at age 35, normal per patient Last Endoscopy:never  Recommendations:    Past Medical History:  Diagnosis Date   COPD (chronic obstructive pulmonary disease) (HCC)    Hard of hearing    OCD (obsessive compulsive disorder)    Restless legs syndrome (RLS)    Sleep apnea     Past Surgical History:  Procedure Laterality Date   ABDOMINAL HYSTERECTOMY     CESAREAN SECTION  1996   FRACTURE SURGERY     foot   JOINT REPLACEMENT     left shoulder   JOINT REPLACEMENT     right shoulder    Current Outpatient Medications  Medication Sig Dispense Refill   albuterol (VENTOLIN HFA) 108 (90 Base) MCG/ACT inhaler INHALE 2 PUFFS INTO THE LUNGS EVERY 6 HOURS AS NEEDED FOR WHEEZING OR SHORTNESS OF BREATH 6.7 g 2   citalopram (CELEXA) 40 MG tablet Take 40 mg by mouth daily.     cyanocobalamin (,VITAMIN B-12,) 1000 MCG/ML injection 1014mg IM daily x 1 week, 10019m IM weekly x 4 weeks, 100038mIM monthly x 1 year. 22 mL 0   Fluticasone-Umeclidin-Vilant (TRELEGY ELLIPTA) 200-62.5-25 MCG/ACT AEPB Inhale 1 puff into the lungs daily. 60 each 5   Fluticasone-Umeclidin-Vilant (TRELEGY ELLIPTA) 200-62.5-25 MCG/ACT AEPB Inhale 1 puff into the lungs  daily. 1 each 0   gabapentin (NEURONTIN) 600 MG tablet One tab tid +2 extra tabs qhs. (Patient taking differently: 600 mg 5 (five) times daily. One tab tid +2 extra tabs qhs.) 450 tablet 3   NEEDLE, DISP, 23 G (BD DISP NEEDLE) 23G X 1" MISC Needle used for B12 IM injections. 22 each 0   omeprazole (PRILOSEC) 20 MG capsule Take 20 mg by mouth daily.     rOPINIRole (REQUIP) 4 MG tablet Take 1 tablet (4 mg total) by mouth at bedtime. 90 tablet 3   rosuvastatin (CRESTOR) 5 MG tablet Take 1 tablet (5 mg total) by mouth daily.  90 tablet 3   SYRINGE-NEEDLE, DISP, 3 ML (B-D 3CC LUER-LOK SYR 18GX1-1/2) 18G X 1-1/2" 3 ML MISC Syringe/needle to draw up B12 injections. 22 each 0   clonazePAM (KLONOPIN) 0.5 MG tablet Take 1 tablet (0.5 mg total) by mouth at bedtime as needed for anxiety. (Patient not taking: Reported on 03/16/2022) 30 tablet 5   rotigotine (NEUPRO) 2 MG/24HR Place 1 patch onto the skin daily. (Patient not taking: Reported on 03/16/2022) 30 patch 12   No current facility-administered medications for this visit.    Allergies as of 03/16/2022 - Review Complete 03/16/2022  Allergen Reaction Noted   Mobic [meloxicam] Other (See Comments) 11/27/2020    Family History  Problem Relation Age of Onset   Alzheimer's disease Mother    Heart attack Father     Social History   Socioeconomic History   Marital status: Single    Spouse name: Not on file   Number of children: 2   Years of education: college   Highest education level: Not on file  Occupational History   Occupation: Press photographer Rep  Tobacco Use   Smoking status: Never    Passive exposure: Never   Smokeless tobacco: Never  Substance and Sexual Activity   Alcohol use: Yes    Comment: Occasional   Drug use: Yes    Types: Marijuana   Sexual activity: Not on file  Other Topics Concern   Not on file  Social History Narrative   Lives alone.   Caffeine use: occasional use   Right-handed.   Social Determinants of Health   Financial Resource Strain: Not on file  Food Insecurity: Not on file  Transportation Needs: Not on file  Physical Activity: Not on file  Stress: Not on file  Social Connections: Not on file   Review of systems General: negative for malaise, night sweats, fever, chills, weight loss Neck: Negative for lumps, goiter, pain and significant neck swelling Resp: Negative for cough, wheezing, dyspnea at rest CV: Negative for chest pain, leg swelling, palpitations, orthopnea GI: denies melena, hematochezia,constipation, dysphagia,  odyonophagia, early satiety or unintentional weight loss. +nausea +vomiting +RUQ pain MSK: Negative for joint pain or swelling, back pain, and muscle pain. Derm: Negative for itching or rash Psych: Denies depression, anxiety, memory loss, confusion. No homicidal or suicidal ideation.  Heme: Negative for prolonged bleeding, bruising easily, and swollen nodes. Endocrine: Negative for cold or heat intolerance, polyuria, polydipsia and goiter. Neuro: negative for tremor, gait imbalance, syncope and seizures. The remainder of the review of systems is noncontributory.  Physical Exam: BP (!) 114/54 (BP Location: Left Arm, Patient Position: Sitting, Cuff Size: Large)   Pulse 80   Temp 98.5 F (36.9 C) (Oral)   Ht 5' 9.5" (1.765 m)   Wt 224 lb 12.8 oz (102 kg)   BMI 32.72 kg/m  General:   Alert  and oriented. No distress noted. Pleasant and cooperative.  Head:  Normocephalic and atraumatic. Eyes:  Conjuctiva clear without scleral icterus. Mouth:  Oral mucosa pink and moist. Good dentition. No lesions. Heart: Normal rate and rhythm, s1 and s2 heart sounds present.  Lungs: Clear lung sounds in all lobes. Respirations equal and unlabored. Abdomen:  +BS, soft, non-tender and non-distended though with some rebound tenderness of RUQ after palpation. No guarding. No HSM or masses noted. Derm: No palmar erythema or jaundice Msk:  Symmetrical without gross deformities. Normal posture. Extremities:  Without edema. Neurologic:  Alert and  oriented x4 Psych:  Alert and cooperative. Normal mood and affect.  Invalid input(s): "6 MONTHS"   ASSESSMENT: [name] [name] is a 66 y.o. female presenting today as a new patient for RUQ pain, nausea and intolerance to fatty foods.   Patient with previously intermittent RUQ pain though with more severity starting the Friday after 7/4 with continued colicky like RUQ pain with nausea, occasional vomiting and intermittent diarrhea. No fevers or chills. CT A/P and [place]  have been unremarkable. Notably lipase was elevated to 185, therefore mild pancreatitis could be considered, though given location of pain, chronicity, lack of risk factors and normal imaging, this seems less likely. LFTs also WNL. Will pursue HIDA scan for further evaluation, especially given that patient has had episodes of intermittent RUQ pain prior to this. She should continue with bland diet for now and plenty of liquids, she has anti emetics on hand she can use as needed. She will make me aware of worsening pain, or new associated symptoms.   Patient is not up to date on screening colonoscopy, will hold off on pursing at this time given acute illness with nausea and vomiting, will discuss scheduling this at next OV, patient is agreeable to this.  PLAN:  1.HIDA scan  2. GB eating plan 3. Stay well hydrated 4. Pt to make me aware of fevers, chills, vomiting not controlled with meds  All questions were answered, patient verbalized understanding and is in agreement with plan as outlined above.   Follow Up: TBD after HIDA scan  Timmia Cogburn L. Alver Sorrow, MSN, APRN, AGNP-C Adult-Gerontology Nurse Practitioner [facility]

## 2022-03-19 ENCOUNTER — Encounter (HOSPITAL_COMMUNITY)
Admission: RE | Admit: 2022-03-19 | Discharge: 2022-03-19 | Disposition: A | Discharge status: Home / Self Care | Payer: BC Managed Care – PPO | Source: Ambulatory Visit | Attending: Gastroenterology | Admitting: Gastroenterology

## 2022-03-19 ENCOUNTER — Encounter (HOSPITAL_COMMUNITY): Payer: Self-pay

## 2022-03-19 DIAGNOSIS — K9049 Malabsorption due to intolerance, not elsewhere classified: Secondary | ICD-10-CM | POA: Insufficient documentation

## 2022-03-19 DIAGNOSIS — R112 Nausea with vomiting, unspecified: Secondary | ICD-10-CM | POA: Insufficient documentation

## 2022-03-19 DIAGNOSIS — R1011 Right upper quadrant pain: Secondary | ICD-10-CM | POA: Diagnosis not present

## 2022-03-19 DIAGNOSIS — Z8719 Personal history of other diseases of the digestive system: Secondary | ICD-10-CM | POA: Diagnosis not present

## 2022-03-19 MED ORDER — TECHNETIUM TC 99M MEBROFENIN IV KIT
5.0000 | PACK | Freq: Once | INTRAVENOUS | Status: AC | PRN
  Administered 2022-03-19: 5.5 via INTRAVENOUS

## 2022-03-19 MED ORDER — STERILE WATER FOR INJECTION IJ SOLN
INTRAMUSCULAR | Status: AC
  Administered 2022-03-19: 2.05 mL via INTRAVENOUS
  Filled 2022-03-19: qty 10

## 2022-03-19 MED ORDER — SINCALIDE 5 MCG IJ SOLR
INTRAMUSCULAR | Status: AC
  Administered 2022-03-19: 2.05 ug via INTRAVENOUS
  Filled 2022-03-19: qty 5

## 2022-03-31 ENCOUNTER — Encounter (INDEPENDENT_AMBULATORY_CARE_PROVIDER_SITE_OTHER): Payer: Self-pay

## 2022-03-31 ENCOUNTER — Other Ambulatory Visit (INDEPENDENT_AMBULATORY_CARE_PROVIDER_SITE_OTHER): Payer: Self-pay

## 2022-03-31 DIAGNOSIS — R1011 Right upper quadrant pain: Secondary | ICD-10-CM

## 2022-03-31 DIAGNOSIS — R112 Nausea with vomiting, unspecified: Secondary | ICD-10-CM

## 2022-04-09 ENCOUNTER — Other Ambulatory Visit: Payer: Self-pay | Admitting: Neurology

## 2022-04-09 DIAGNOSIS — E538 Deficiency of other specified B group vitamins: Secondary | ICD-10-CM

## 2022-04-13 ENCOUNTER — Encounter: Payer: Self-pay | Admitting: Internal Medicine

## 2022-04-13 ENCOUNTER — Ambulatory Visit (INDEPENDENT_AMBULATORY_CARE_PROVIDER_SITE_OTHER): Payer: BC Managed Care – PPO | Admitting: Internal Medicine

## 2022-04-13 VITALS — BP 124/69 | HR 83 | Ht 69.0 in | Wt 221.4 lb

## 2022-04-13 DIAGNOSIS — E538 Deficiency of other specified B group vitamins: Secondary | ICD-10-CM | POA: Diagnosis not present

## 2022-04-13 DIAGNOSIS — J449 Chronic obstructive pulmonary disease, unspecified: Secondary | ICD-10-CM

## 2022-04-13 DIAGNOSIS — E559 Vitamin D deficiency, unspecified: Secondary | ICD-10-CM | POA: Diagnosis not present

## 2022-04-13 DIAGNOSIS — G2581 Restless legs syndrome: Secondary | ICD-10-CM

## 2022-04-13 DIAGNOSIS — E785 Hyperlipidemia, unspecified: Secondary | ICD-10-CM | POA: Diagnosis not present

## 2022-04-13 DIAGNOSIS — Z0001 Encounter for general adult medical examination with abnormal findings: Secondary | ICD-10-CM

## 2022-04-13 DIAGNOSIS — Z23 Encounter for immunization: Secondary | ICD-10-CM

## 2022-04-13 DIAGNOSIS — Z8669 Personal history of other diseases of the nervous system and sense organs: Secondary | ICD-10-CM | POA: Diagnosis not present

## 2022-04-13 DIAGNOSIS — D649 Anemia, unspecified: Secondary | ICD-10-CM | POA: Diagnosis not present

## 2022-04-13 DIAGNOSIS — Z0000 Encounter for general adult medical examination without abnormal findings: Secondary | ICD-10-CM

## 2022-04-13 DIAGNOSIS — R1011 Right upper quadrant pain: Secondary | ICD-10-CM

## 2022-04-13 DIAGNOSIS — F419 Anxiety disorder, unspecified: Secondary | ICD-10-CM

## 2022-04-13 HISTORY — DX: Hyperlipidemia, unspecified: E78.5

## 2022-04-13 NOTE — Assessment & Plan Note (Signed)
Diagnosed 2017.  Symptoms well controlled on Trelegy Ellipta.  She has not needed to use her albuterol inhaler.  Normal pulmonary exam today.

## 2022-04-13 NOTE — Assessment & Plan Note (Signed)
She is taking ropinirole and gabapentin nightly for symptom relief.  She states that when she takes her medications as currently prescribed, her symptoms are manageable. -No changes today -Checking B12/folate levels

## 2022-04-13 NOTE — Progress Notes (Signed)
New Patient Office Visit  Subjective    Patient ID: [name] [name], female    DOB: Sep 13, 1955  Age: 66 y.o. MRN: [id number]  CC:  Chief Complaint  Patient presents with   Establish Care   HPI [name] [name] presents to establish care.  She is a 66 year old woman with a past medical history significant for COPD, anxiety, RLS, OSA, HLD.  She is also followed by gastroenterology for right upper quadrant pain and will undergo EGD later this month.  Today [name] [name] continues to endorse right upper quadrant pain but is otherwise asymptomatic.  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  She is fearful of getting COVID-19 or another illness from one of her coworkers.  Outpatient Encounter Medications as of 04/13/2022  Medication Sig   albuterol (VENTOLIN HFA) 108 (90 Base) MCG/ACT inhaler INHALE 2 PUFFS INTO THE LUNGS EVERY 6 HOURS AS NEEDED FOR WHEEZING OR SHORTNESS OF BREATH   citalopram (CELEXA) 40 MG tablet Take 40 mg by mouth daily.   Fluticasone-Umeclidin-Vilant (TRELEGY ELLIPTA) 200-62.5-25 MCG/ACT AEPB Inhale 1 puff into the lungs daily.   Fluticasone-Umeclidin-Vilant (TRELEGY ELLIPTA) 200-62.5-25 MCG/ACT AEPB Inhale 1 puff into the lungs daily.   gabapentin (NEURONTIN) 600 MG tablet One tab tid +2 extra tabs qhs. (Patient taking differently: 600 mg 5 (five) times daily. One tab tid +2 extra tabs qhs.)   NEEDLE, DISP, 23 G (BD DISP NEEDLE) 23G X 1" MISC Needle used for B12 IM injections.   omeprazole (PRILOSEC) 20 MG capsule Take 20 mg by mouth daily.   rOPINIRole (REQUIP) 4 MG tablet Take 1 tablet (4 mg total) by mouth at bedtime.   rosuvastatin (CRESTOR) 5 MG tablet Take 1 tablet (5 mg total) by mouth daily.   SYRINGE-NEEDLE, DISP, 3 ML (B-D 3CC LUER-LOK SYR 18GX1-1/2) 18G X 1-1/2" 3 ML MISC Syringe/needle to draw up B12 injections.   [DISCONTINUED] cyanocobalamin (,VITAMIN B-12,)  1000 MCG/ML injection 1018mg IM daily x 1 week, 10034m IM weekly x 4 weeks, 100031mIM monthly x 1 year.   [DISCONTINUED] clonazePAM (KLONOPIN) 0.5 MG tablet Take 1 tablet (0.5 mg total) by mouth at bedtime as needed for anxiety. (Patient not taking: Reported on 03/16/2022)   [DISCONTINUED] rotigotine (NEUPRO) 2 MG/24HR Place 1 patch onto the skin daily. (Patient not taking: Reported on 03/16/2022)   No facility-administered encounter medications on file as of 04/13/2022.    Past Medical History:  Diagnosis Date   Allergy Mobic   Arthritis    COPD (chronic obstructive pulmonary disease) (HCC)    Emphysema of lung (HCC)    GERD (gastroesophageal reflux disease)    Hard of hearing    Hyperlipidemia 04/13/2022   OCD (obsessive compulsive disorder)    Restless legs syndrome (RLS)    Sleep apnea     Past Surgical History:  Procedure Laterality Date   ABDOMINAL HYSTERECTOMY     CESAREAN SECTION  1996   FRACTURE SURGERY     foot   JOINT REPLACEMENT     left shoulder   JOINT REPLACEMENT     right shoulder    Family History  Problem Relation Age of Onset   Alzheimer's disease Mother    Arthritis Mother    Cancer Mother    Heart attack Father    Heart disease Father    Diabetes Paternal Grandmother    Diabetes Brother     Social  History   Socioeconomic History   Marital status: Single    Spouse name: Not on file   Number of children: 2   Years of education: college   Highest education level: Not on file  Occupational History   Occupation: Sales Rep  Tobacco Use   Smoking status: Former    Packs/day: 1.00    Years: 15.00    Total pack years: 15.00    Types: Cigarettes, E-cigarettes    Quit date: 03/23/2012    Years since quitting: 10.0    Passive exposure: Never   Smokeless tobacco: Never  Substance and Sexual Activity   Alcohol use: Yes    Comment: It's not a weekly occurance   Drug use: Yes    Types: Marijuana   Sexual activity: Not on file  Other Topics  Concern   Not on file  Social History Narrative   Lives alone.   Caffeine use: occasional use   Right-handed.   Social Determinants of Health   Financial Resource Strain: Not on file  Food Insecurity: Not on file  Transportation Needs: Not on file  Physical Activity: Not on file  Stress: Not on file  Social Connections: Not on file  Intimate Partner Violence: Not on file   Review of Systems  Constitutional:  Negative for chills and fever.  HENT:  Negative for sore throat.   Respiratory:  Negative for cough and shortness of breath.   Cardiovascular:  Negative for chest pain, palpitations and leg swelling.  Gastrointestinal:  Positive for abdominal pain. Negative for blood in stool, constipation, diarrhea, nausea and vomiting.       RUQ pain  Genitourinary:  Negative for dysuria and hematuria.  Musculoskeletal:  Negative for myalgias.  Skin:  Negative for itching and rash.  Neurological:  Negative for dizziness and headaches.  Psychiatric/Behavioral:  Negative for depression and suicidal ideas.     Objective    BP 124/69   Pulse 83   Ht 5' 9"  (1.753 m)   Wt 221 lb 6.4 oz (100.4 kg)   SpO2 94%   BMI 32.70 kg/m   Physical Exam Vitals reviewed.  Constitutional:      General: She is not in acute distress.    Appearance: Normal appearance. She is not toxic-appearing.  HENT:     Head: Normocephalic and atraumatic.     Nose: Nose normal. No congestion or rhinorrhea.     Mouth/Throat:     Mouth: Mucous membranes are moist.     Pharynx: Oropharynx is clear. No oropharyngeal exudate or posterior oropharyngeal erythema.  Eyes:     General: No scleral icterus.    Extraocular Movements: Extraocular movements intact.     Conjunctiva/sclera: Conjunctivae normal.     Pupils: Pupils are equal, round, and reactive to light.  Cardiovascular:     Rate and Rhythm: Normal rate and regular rhythm.  Pulmonary:     Effort: Pulmonary effort is normal.     Breath sounds: Normal breath  sounds. No wheezing, rhonchi or rales.  Abdominal:     General: Abdomen is flat. Bowel sounds are normal. There is no distension.     Palpations: Abdomen is soft.     Tenderness: There is abdominal tenderness.     Comments: Mild RUQ tenderness on exam. Negative Murphy's  Musculoskeletal:        General: No swelling. Normal range of motion.     Cervical back: Normal range of motion.     Right lower leg: No edema.  Left lower leg: No edema.  Skin:    General: Skin is warm and dry.     Capillary Refill: Capillary refill takes less than 2 seconds.     Coloration: Skin is not jaundiced.  Neurological:     General: No focal deficit present.     Mental Status: She is alert and oriented to person, place, and time.  Psychiatric:        Mood and Affect: Mood normal.        Behavior: Behavior normal.     Last CBC Lab Results  Component Value Date   WBC 8.5 10/09/2021   HGB 10.8 (L) 10/09/2021   HCT 33.7 (L) 10/09/2021   MCV 86 10/09/2021   MCH 27.7 10/09/2021   RDW 13.6 10/09/2021   PLT 209 97/98/9211   Last metabolic panel Lab Results  Component Value Date   GLUCOSE 105 (H) 03/11/2021   NA 142 03/11/2021   K 4.3 03/11/2021   CL 106 03/11/2021   CO2 24 03/11/2021   BUN 11 03/11/2021   CREATININE 0.79 03/11/2021   EGFR 90 03/11/2021   CALCIUM 9.1 03/11/2021   PROT 6.6 10/09/2021   LABGLOB 2.8 10/09/2021   Last lipids Lab Results  Component Value Date   CHOL 229 (H) 03/11/2021   HDL 44 03/11/2021   LDLCALC 148 (H) 03/11/2021   TRIG 206 (H) 03/11/2021   CHOLHDL 5.2 (H) 03/11/2021   Last hemoglobin A1c Lab Results  Component Value Date   HGBA1C 5.8 (H) 10/09/2021   Last thyroid functions Lab Results  Component Value Date   TSH 3.560 10/09/2021   Last vitamin D Lab Results  Component Value Date   VD25OH 33.7 03/11/2021   Last vitamin B12 and Folate Lab Results  Component Value Date   VITAMINB12 177 (L) 10/09/2021     Assessment & Plan:   Problem  List Items Addressed This Visit       Respiratory   COPD (chronic obstructive pulmonary disease) ([place])    Diagnosed 2017.  Symptoms well controlled on Trelegy Ellipta.  She has not needed to use her albuterol inhaler.  Normal pulmonary exam today.        Other   Preventative health care - Primary    - Influenza vaccine given today -She is due for repeat colonoscopy and will discuss adding on screening colonoscopy to EGD planned for later this month with GI. -We will schedule Pap smear in 4 weeks -Baseline labs ordered today -She reports that she has completed Shingrix vaccination.  We will obtain records from [place]. -She will get her next COVID-19 booster at her local pharmacy -She reports that she is up-to-date on her mammogram.  We will get records from [place]       Hx of sleep apnea    Poorly tolerant of CPAP.  She has tried and orthodontic device with little success as well.  I counseled her on the importance of CPAP compliance.  She expressed understanding.  She has follow-up with pulmonology scheduled for November.      Restless leg    She is taking ropinirole and gabapentin nightly for symptom relief.  She states that when she takes her medications as currently prescribed, her symptoms are manageable. -No changes today -Checking B12/folate levels      Anxiety    Currently prescribed Celexa 40 mg daily.  Today she endorsed significant anxiety related to returning to work after being permitted to work at home during the COVID-19 pandemic.  She has previously discussed this with psychiatry, but expresses that they did not seem to empathize with her concerns.  She is supposed to follow-up with psychiatry again soon.      Colicky RUQ abdominal pain    Currently followed by GI for RUQ abdominal pain.  Etiology remains unclear.  She will undergo EGD later this month. -Checking CMP today      Hyperlipidemia    She is currently prescribed Crestor 5 mg daily -Repeat  lipid panel today      Relevant Orders   Lipid Profile   Return in about 4 weeks (around 05/11/2022).   Johnette Abraham, MD

## 2022-04-13 NOTE — Assessment & Plan Note (Signed)
She is currently prescribed Crestor 5 mg daily -Repeat lipid panel today

## 2022-04-13 NOTE — Assessment & Plan Note (Signed)
Poorly tolerant of CPAP.  She has tried and orthodontic device with little success as well.  I counseled her on the importance of CPAP compliance.  She expressed understanding.  She has follow-up with pulmonology scheduled for November.

## 2022-04-13 NOTE — Assessment & Plan Note (Addendum)
Currently followed by GI for RUQ abdominal pain.  Etiology remains unclear.  She will undergo EGD later this month. -Checking CMP today

## 2022-04-13 NOTE — Patient Instructions (Signed)
It was a pleasure to see you today.  Thank you for giving [place] the opportunity to be involved in your care.  Below is a brief recap of your visit and next steps.  We will plan to see you again in 4 weeks  Summary We will get baseline labs today and administer your flu shot We will also work on getting your records from [place]  Next steps Follow up in 4 weeks for pap smear and to review labs

## 2022-04-13 NOTE — Assessment & Plan Note (Addendum)
Currently prescribed Celexa 40 mg daily.  Today she endorsed significant anxiety related to returning to work after being permitted to work at home during the COVID-19 pandemic.  She has previously discussed this with psychiatry, but expresses that they did not seem to empathize with her concerns.  She is supposed to follow-up with psychiatry again soon.

## 2022-04-13 NOTE — Assessment & Plan Note (Addendum)
-   Influenza vaccine given today -She is due for repeat colonoscopy and will discuss adding on screening colonoscopy to EGD planned for later this month with GI. -We will schedule Pap smear in 4 weeks -Baseline labs ordered today -She reports that she has completed Shingrix vaccination.  We will obtain records from [place]. -She will get her next COVID-19 booster at her local pharmacy -She reports that she is up-to-date on her mammogram.  We will get records from [place]

## 2022-04-14 LAB — CBC WITH DIFFERENTIAL/PLATELET
Basophils Absolute: 0 10*3/uL (ref 0.0–0.2)
Basos: 1 %
EOS (ABSOLUTE): 0.1 10*3/uL (ref 0.0–0.4)
Eos: 2 %
Hematocrit: 38.7 % (ref 34.0–46.6)
Hemoglobin: 12.3 g/dL (ref 11.1–15.9)
Immature Grans (Abs): 0 10*3/uL (ref 0.0–0.1)
Immature Granulocytes: 0 %
Lymphocytes Absolute: 1.8 10*3/uL (ref 0.7–3.1)
Lymphs: 30 %
MCH: 28.9 pg (ref 26.6–33.0)
MCHC: 31.8 g/dL (ref 31.5–35.7)
MCV: 91 fL (ref 79–97)
Monocytes Absolute: 0.4 10*3/uL (ref 0.1–0.9)
Monocytes: 7 %
Neutrophils Absolute: 3.7 10*3/uL (ref 1.4–7.0)
Neutrophils: 60 %
Platelets: 192 10*3/uL (ref 150–450)
RBC: 4.25 x10E6/uL (ref 3.77–5.28)
RDW: 13.2 % (ref 11.7–15.4)
WBC: 6.1 10*3/uL (ref 3.4–10.8)

## 2022-04-14 LAB — CMP14+EGFR
ALT: 8 IU/L (ref 0–32)
AST: 11 IU/L (ref 0–40)
Albumin/Globulin Ratio: 1.6 (ref 1.2–2.2)
Albumin: 4.2 g/dL (ref 3.9–4.9)
Alkaline Phosphatase: 105 IU/L (ref 44–121)
BUN/Creatinine Ratio: 10 — ABNORMAL LOW (ref 12–28)
BUN: 11 mg/dL (ref 8–27)
Bilirubin Total: 0.4 mg/dL (ref 0.0–1.2)
CO2: 24 mmol/L (ref 20–29)
Calcium: 9.1 mg/dL (ref 8.7–10.3)
Chloride: 104 mmol/L (ref 96–106)
Creatinine, Ser: 1.08 mg/dL — ABNORMAL HIGH (ref 0.57–1.00)
Globulin, Total: 2.7 g/dL (ref 1.5–4.5)
Glucose: 97 mg/dL (ref 70–99)
Potassium: 5.1 mmol/L (ref 3.5–5.2)
Sodium: 142 mmol/L (ref 134–144)
Total Protein: 6.9 g/dL (ref 6.0–8.5)
eGFR: 57 mL/min/{1.73_m2} — ABNORMAL LOW (ref >59)

## 2022-04-14 LAB — TSH+FREE T4
Free T4: 0.91 ng/dL (ref 0.82–1.77)
TSH: 2.86 u[IU]/mL (ref 0.450–4.500)

## 2022-04-14 LAB — B12 AND FOLATE PANEL
Folate: 2.4 ng/mL — ABNORMAL LOW (ref >3.0)
Vitamin B-12: 372 pg/mL (ref 232–1245)

## 2022-04-14 LAB — HEMOGLOBIN A1C
Est. average glucose Bld gHb Est-mCnc: 117 mg/dL
Hgb A1c MFr Bld: 5.7 % — ABNORMAL HIGH (ref 4.8–5.6)

## 2022-04-14 LAB — LIPID PANEL
Chol/HDL Ratio: 3.6 ratio (ref 0.0–4.4)
Cholesterol, Total: 158 mg/dL (ref 100–199)
HDL: 44 mg/dL (ref >39)
LDL Chol Calc (NIH): 90 mg/dL (ref 0–99)
Triglycerides: 135 mg/dL (ref 0–149)
VLDL Cholesterol Cal: 24 mg/dL (ref 5–40)

## 2022-04-14 LAB — VITAMIN D 25 HYDROXY (VIT D DEFICIENCY, FRACTURES): Vit D, 25-Hydroxy: 38.4 ng/mL (ref 30.0–100.0)

## 2022-04-20 ENCOUNTER — Other Ambulatory Visit: Payer: Self-pay

## 2022-04-20 DIAGNOSIS — E785 Hyperlipidemia, unspecified: Secondary | ICD-10-CM | POA: Diagnosis not present

## 2022-04-21 LAB — BMP8+EGFR
BUN/Creatinine Ratio: 14 (ref 12–28)
BUN: 11 mg/dL (ref 8–27)
CO2: 24 mmol/L (ref 20–29)
Calcium: 9.1 mg/dL (ref 8.7–10.3)
Chloride: 104 mmol/L (ref 96–106)
Creatinine, Ser: 0.79 mg/dL (ref 0.57–1.00)
Glucose: 90 mg/dL (ref 70–99)
Potassium: 4.7 mmol/L (ref 3.5–5.2)
Sodium: 144 mmol/L (ref 134–144)
eGFR: 83 mL/min/{1.73_m2} (ref >59)

## 2022-04-22 ENCOUNTER — Ambulatory Visit (HOSPITAL_COMMUNITY)
Admission: RE | Admit: 2022-04-22 | Discharge: 2022-04-22 | Disposition: A | Discharge status: Home / Self Care | Payer: BC Managed Care – PPO | Attending: Gastroenterology | Admitting: Gastroenterology

## 2022-04-22 ENCOUNTER — Other Ambulatory Visit: Payer: Self-pay

## 2022-04-22 ENCOUNTER — Encounter (HOSPITAL_COMMUNITY): Payer: Self-pay | Admitting: Gastroenterology

## 2022-04-22 ENCOUNTER — Ambulatory Visit (HOSPITAL_COMMUNITY): Payer: BC Managed Care – PPO | Admitting: Anesthesiology

## 2022-04-22 ENCOUNTER — Encounter (HOSPITAL_COMMUNITY)
Admission: RE | Disposition: A | Discharge status: Home / Self Care | Payer: Self-pay | Source: Home / Self Care | Attending: Gastroenterology

## 2022-04-22 DIAGNOSIS — K449 Diaphragmatic hernia without obstruction or gangrene: Secondary | ICD-10-CM | POA: Diagnosis not present

## 2022-04-22 DIAGNOSIS — K2950 Unspecified chronic gastritis without bleeding: Secondary | ICD-10-CM | POA: Insufficient documentation

## 2022-04-22 DIAGNOSIS — K319 Disease of stomach and duodenum, unspecified: Secondary | ICD-10-CM | POA: Insufficient documentation

## 2022-04-22 DIAGNOSIS — Z87891 Personal history of nicotine dependence: Secondary | ICD-10-CM | POA: Diagnosis not present

## 2022-04-22 DIAGNOSIS — Z79899 Other long term (current) drug therapy: Secondary | ICD-10-CM | POA: Diagnosis not present

## 2022-04-22 DIAGNOSIS — K219 Gastro-esophageal reflux disease without esophagitis: Secondary | ICD-10-CM | POA: Insufficient documentation

## 2022-04-22 DIAGNOSIS — K2980 Duodenitis without bleeding: Secondary | ICD-10-CM | POA: Insufficient documentation

## 2022-04-22 DIAGNOSIS — F429 Obsessive-compulsive disorder, unspecified: Secondary | ICD-10-CM | POA: Diagnosis not present

## 2022-04-22 DIAGNOSIS — J439 Emphysema, unspecified: Secondary | ICD-10-CM | POA: Diagnosis not present

## 2022-04-22 DIAGNOSIS — K2289 Other specified disease of esophagus: Secondary | ICD-10-CM | POA: Diagnosis not present

## 2022-04-22 DIAGNOSIS — R1011 Right upper quadrant pain: Secondary | ICD-10-CM | POA: Insufficient documentation

## 2022-04-22 DIAGNOSIS — E785 Hyperlipidemia, unspecified: Secondary | ICD-10-CM | POA: Insufficient documentation

## 2022-04-22 DIAGNOSIS — G2581 Restless legs syndrome: Secondary | ICD-10-CM | POA: Insufficient documentation

## 2022-04-22 DIAGNOSIS — G4730 Sleep apnea, unspecified: Secondary | ICD-10-CM | POA: Insufficient documentation

## 2022-04-22 DIAGNOSIS — R112 Nausea with vomiting, unspecified: Secondary | ICD-10-CM

## 2022-04-22 DIAGNOSIS — J449 Chronic obstructive pulmonary disease, unspecified: Secondary | ICD-10-CM | POA: Diagnosis not present

## 2022-04-22 HISTORY — PX: ESOPHAGOGASTRODUODENOSCOPY (EGD) WITH PROPOFOL: SHX5813

## 2022-04-22 HISTORY — PX: BIOPSY: SHX5522

## 2022-04-22 SURGERY — ESOPHAGOGASTRODUODENOSCOPY (EGD) WITH PROPOFOL
Anesthesia: General

## 2022-04-22 MED ORDER — LIDOCAINE HCL (CARDIAC) PF 100 MG/5ML IV SOSY
PREFILLED_SYRINGE | INTRAVENOUS | Status: DC | PRN
  Administered 2022-04-22: 50 mg via INTRAVENOUS

## 2022-04-22 MED ORDER — LACTATED RINGERS IV SOLN: INTRAVENOUS | Status: DC

## 2022-04-22 MED ORDER — OMEPRAZOLE 40 MG PO CPDR: 40.0000 mg | DELAYED_RELEASE_CAPSULE | Freq: Every day | ORAL | 3 refills | Status: DC

## 2022-04-22 MED ORDER — PROPOFOL 10 MG/ML IV BOLUS
INTRAVENOUS | Status: DC | PRN
  Administered 2022-04-22: 30 mg via INTRAVENOUS
  Administered 2022-04-22: 50 mg via INTRAVENOUS
  Administered 2022-04-22: 120 mg via INTRAVENOUS

## 2022-04-22 NOTE — H&P (Signed)
[name] [name] is an 66 y.o. female.   Chief Complaint: Right upper quadrant abdominal pain HPI: 66 year old female with past medical history of COPD, GERD, hyperlipidemia, OCD, restless leg syndrome, sleep apnea, coming for evaluation of abdominal pain.  Reports for the last month she has presented recurrent episodes of stabbing abdominal pain in her right upper quadrant.  She has had multiple investigations with negative HIDA scan recently.  No other associated symptoms.    Past Medical History:  Diagnosis Date   Allergy Mobic   Arthritis    COPD (chronic obstructive pulmonary disease) (HCC)    Emphysema of lung (HCC)    GERD (gastroesophageal reflux disease)    Hard of hearing    Hyperlipidemia 04/13/2022   OCD (obsessive compulsive disorder)    Restless legs syndrome (RLS)    Sleep apnea     Past Surgical History:  Procedure Laterality Date   ABDOMINAL HYSTERECTOMY     CESAREAN SECTION  1996   FRACTURE SURGERY     foot   JOINT REPLACEMENT     left shoulder   JOINT REPLACEMENT     right shoulder    Family History  Problem Relation Age of Onset   Alzheimer's disease Mother    Arthritis Mother    Cancer Mother    Heart attack Father    Heart disease Father    Diabetes Paternal Grandmother    Diabetes Brother    Social History:  reports that she quit smoking about 10 years ago. Her smoking use included cigarettes and e-cigarettes. She has a 15.00 pack-year smoking history. She has never been exposed to tobacco smoke. She has never used smokeless tobacco. She reports current alcohol use. She reports current drug use. Drug: Marijuana.  Allergies:  Allergies  Allergen Reactions   Mobic [Meloxicam] Anaphylaxis   Buspirone Anxiety    Medications Prior to Admission  Medication Sig Dispense Refill   albuterol (VENTOLIN HFA) 108 (90 Base) MCG/ACT inhaler INHALE 2 PUFFS INTO THE LUNGS EVERY 6 HOURS AS NEEDED FOR WHEEZING OR SHORTNESS OF BREATH 6.7 g 2   citalopram  (CELEXA) 40 MG tablet Take 40 mg by mouth daily.     cyanocobalamin (VITAMIN B12) 1000 MCG/ML injection 1ML ONCE MONTHLY. 22 mL 0   Fluticasone-Umeclidin-Vilant (TRELEGY ELLIPTA) 200-62.5-25 MCG/ACT AEPB Inhale 1 puff into the lungs daily. 60 each 5   gabapentin (NEURONTIN) 600 MG tablet One tab tid +2 extra tabs qhs. (Patient taking differently: Take 600 mg by mouth in the morning, at noon, in the evening, and at bedtime.) 450 tablet 3   omeprazole (PRILOSEC) 20 MG capsule Take 20 mg by mouth daily.     rOPINIRole (REQUIP) 4 MG tablet Take 1 tablet (4 mg total) by mouth at bedtime. 90 tablet 3   rosuvastatin (CRESTOR) 5 MG tablet Take 1 tablet (5 mg total) by mouth daily. 90 tablet 3   HYDROcodone-acetaminophen (NORCO/VICODIN) 5-325 MG tablet Take 1-2 tablets by mouth every 6 (six) hours as needed for moderate pain or severe pain.     NEEDLE, DISP, 23 G (BD DISP NEEDLE) 23G X 1" MISC Needle used for B12 IM injections. 22 each 0   ondansetron (ZOFRAN-ODT) 4 MG disintegrating tablet Take 4 mg by mouth every 12 (twelve) hours as needed for nausea/vomiting.     prochlorperazine (COMPAZINE) 10 MG tablet Take 10 mg by mouth 3 (three) times daily as needed for nausea/vomiting.     promethazine (PHENERGAN) 25 MG tablet Take 25 mg by  mouth every 6 (six) hours as needed for nausea/vomiting.     SYRINGE-NEEDLE, DISP, 3 ML (B-D 3CC LUER-LOK SYR 18GX1-1/2) 18G X 1-1/2" 3 ML MISC Syringe/needle to draw up B12 injections. 22 each 0    Results for orders placed or performed in visit on 04/20/22 (from the past 48 hour(s))  BMP8+EGFR     Status: None   Collection Time: 04/20/22  3:03 PM  Result Value Ref Range   Glucose 90 70 - 99 mg/dL   BUN 11 8 - 27 mg/dL   Creatinine, Ser 0.79 0.57 - 1.00 mg/dL   eGFR 83 >59 mL/min/1.73   BUN/Creatinine Ratio 14 12 - 28   Sodium 144 134 - 144 mmol/L   Potassium 4.7 3.5 - 5.2 mmol/L   Chloride 104 96 - 106 mmol/L   CO2 24 20 - 29 mmol/L   Calcium 9.1 8.7 - 10.3 mg/dL    No results found.  Review of Systems  Constitutional: Negative.   HENT: Negative.    Eyes: Negative.   Respiratory: Negative.    Cardiovascular: Negative.   Gastrointestinal:  Positive for abdominal pain.  Endocrine: Negative.   Genitourinary: Negative.   Musculoskeletal: Negative.   Skin: Negative.   Allergic/Immunologic: Negative.   Neurological: Negative.   Hematological: Negative.   Psychiatric/Behavioral: Negative.      Blood pressure (!) 121/47, pulse 75, temperature 99.3 F (37.4 C), temperature source Oral, resp. rate 20, height 5' 9"  (1.753 m), weight 100.4 kg. Physical Exam  GENERAL: The patient is AO x3, in no acute distress. HEENT: Head is normocephalic and atraumatic. EOMI are intact. Mouth is well hydrated and without lesions. NECK: Supple. No masses LUNGS: Clear to auscultation. No presence of rhonchi/wheezing/rales. Adequate chest expansion HEART: RRR, normal s1 and s2. ABDOMEN: Soft, nontender, no guarding, no peritoneal signs, and nondistended. BS +. No masses. EXTREMITIES: Without any cyanosis, clubbing, rash, lesions or edema. NEUROLOGIC: AOx3, no focal motor deficit. SKIN: no jaundice, no rashes  Assessment/Plan 66 year old female with past medical history of COPD, GERD, hyperlipidemia, OCD, restless leg syndrome, sleep apnea, coming for evaluation of abdominal pain.  We will proceed with EGD.  Harvel Quale, MD 04/22/2022, 1:29 PM

## 2022-04-22 NOTE — Transfer of Care (Signed)
Immediate Anesthesia Transfer of Care Note  Patient: [name] [name]  Procedure(s) Performed: ESOPHAGOGASTRODUODENOSCOPY (EGD) WITH PROPOFOL BIOPSY  Patient Location: Endoscopy Unit  Anesthesia Type:General  Level of Consciousness: awake  Airway & Oxygen Therapy: Patient Spontanous Breathing  Post-op Assessment: Report given to RN and Post -op Vital signs reviewed and stable  Post vital signs: Reviewed and stable  Last Vitals:  Vitals Value Taken Time  BP 101/64 04/22/22 1403  Temp 36.8 C 04/22/22 1403  Pulse 79 04/22/22 1403  Resp 17 04/22/22 1403  SpO2 93 % 04/22/22 1403    Last Pain:  Vitals:   04/22/22 1403  TempSrc: Oral  PainSc: 0-No pain      Patients Stated Pain Goal: 7 (74/16/38 4536)  Complications: No notable events documented.

## 2022-04-22 NOTE — Op Note (Signed)
[facility] Patient Name: [name] [name] Procedure Date: 04/22/2022 1:44 PM MRN: [id number] Date of Birth: Dec 11, 1955 Attending MD: Maylon Peppers ,  CSN: [id number] Age: 66 Admit Type: Outpatient Procedure:                Upper GI endoscopy Indications:              Abdominal pain in the right upper quadrant Providers:                Maylon Peppers, Lambert Mody, Ladoris Gene                            Technician, Technician, Kristine L. Risa Grill,                            Technician Referring MD:              Medicines:                Monitored Anesthesia Care Complications:            No immediate complications. Estimated Blood Loss:     Estimated blood loss: none. Procedure:                Pre-Anesthesia Assessment:                           - Prior to the procedure, a History and Physical                            was performed, and patient medications, allergies                            and sensitivities were reviewed. The patient's                            tolerance of previous anesthesia was reviewed.                           - The risks and benefits of the procedure and the                            sedation options and risks were discussed with the                            patient. All questions were answered and informed                            consent was obtained.                           - ASA Grade Assessment: II - A patient with mild                            systemic disease.                           After obtaining informed consent, the endoscope was  passed under direct vision. Throughout the                            procedure, the patient's blood pressure, pulse, and                            oxygen saturations were monitored continuously. The                            GIF-H190 ([id number]) scope was introduced through the                            mouth, and advanced to the second part of duodenum.                             The upper GI endoscopy was accomplished without                            difficulty. The patient tolerated the procedure                            well. Scope In: 1:56:24 PM Scope Out: 2:00:44 PM Total Procedure Duration: 0 hours 4 minutes 20 seconds  Findings:      Two tongues of salmon-colored mucosa were present from 36 to 37 cm. The       maximum longitudinal extent of these esophageal mucosal changes was 1 cm       in length. Biopsies were taken with a cold forceps for histology.      A 2 cm hiatal hernia was present.      The entire examined stomach was normal. Biopsies were taken with a cold       forceps for Helicobacter pylori testing.      Patchy mild inflammation characterized by congestion (edema) and       erythema was found in the first portion of the duodenum. Impression:               - Salmon-colored mucosa suspicious for                            short-segment Barrett's esophagus. Biopsied.                           - 2 cm hiatal hernia.                           - Normal stomach. Biopsied.                           - Duodenitis. Moderate Sedation:      Per Anesthesia Care Recommendation:           - Discharge patient to home (ambulatory).                           - Resume previous diet.                           -  Await pathology results.                           - Continue present medications - increase                            omeprazole to 40 mg qday.                           - H. pylori                           - If normal biopsies, may consider follow up with                            general surgery. Procedure Code(s):        --- Professional ---                           [phone], Esophagogastroduodenoscopy, flexible,                            transoral; with biopsy, single or multiple Diagnosis Code(s):        --- Professional ---                           K22.8, Other specified diseases of esophagus                            K44.9, Diaphragmatic hernia without obstruction or                            gangrene                           K29.80, Duodenitis without bleeding                           R10.11, Right upper quadrant pain CPT copyright 2019 American Medical Association. All rights reserved. The codes documented in this report are preliminary and upon coder review may  be revised to meet current compliance requirements. Maylon Peppers, MD Maylon Peppers,  04/22/2022 2:09:40 PM This report has been signed electronically. Number of Addenda: 0

## 2022-04-22 NOTE — Anesthesia Preprocedure Evaluation (Signed)
Anesthesia Evaluation  Patient identified by MRN, date of birth, ID band Patient awake    Reviewed: Allergy & Precautions, H&P , NPO status , Patient's Chart, lab work & pertinent test results, reviewed documented beta blocker date and time   Airway Mallampati: II  TM Distance: >3 FB Neck ROM: full    Dental no notable dental hx.    Pulmonary sleep apnea , COPD, former smoker,    Pulmonary exam normal breath sounds clear to auscultation       Cardiovascular Exercise Tolerance: Good negative cardio ROS   Rhythm:regular Rate:Normal     Neuro/Psych PSYCHIATRIC DISORDERS Anxiety negative neurological ROS     GI/Hepatic Neg liver ROS, GERD  Medicated,  Endo/Other  negative endocrine ROS  Renal/GU negative Renal ROS  negative genitourinary   Musculoskeletal   Abdominal   Peds  Hematology  (+) Blood dyscrasia, anemia ,   Anesthesia Other Findings   Reproductive/Obstetrics negative OB ROS                             Anesthesia Physical Anesthesia Plan  ASA: 2  Anesthesia Plan: General   Post-op Pain Management:    Induction:   PONV Risk Score and Plan: Propofol infusion  Airway Management Planned:   Additional Equipment:   Intra-op Plan:   Post-operative Plan:   Informed Consent: I have reviewed the patients History and Physical, chart, labs and discussed the procedure including the risks, benefits and alternatives for the proposed anesthesia with the patient or authorized representative who has indicated his/her understanding and acceptance.     Dental Advisory Given  Plan Discussed with: CRNA  Anesthesia Plan Comments:         Anesthesia Quick Evaluation

## 2022-04-22 NOTE — Discharge Instructions (Addendum)
You are being discharged to home.  Resume your previous diet.  We are waiting for your pathology results.  Continue your present medications - increase omeprazole to 40 mg qday If normal biopsies, may consider follow up with general surgery.

## 2022-04-23 LAB — H. PYLORI ANTIBODY, IGG: H Pylori IgG: 0.44 Index Value (ref 0.00–0.79)

## 2022-04-23 NOTE — Anesthesia Postprocedure Evaluation (Signed)
Anesthesia Post Note  Patient: [name] [name]  Procedure(s) Performed: ESOPHAGOGASTRODUODENOSCOPY (EGD) WITH PROPOFOL BIOPSY  Patient location during evaluation: Phase II Anesthesia Type: General Level of consciousness: awake Pain management: pain level controlled Vital Signs Assessment: post-procedure vital signs reviewed and stable Respiratory status: spontaneous breathing and respiratory function stable Cardiovascular status: blood pressure returned to baseline and stable Postop Assessment: no headache and no apparent nausea or vomiting Anesthetic complications: no Comments: Late entry   No notable events documented.   Last Vitals:  Vitals:   04/22/22 1304 04/22/22 1403  BP: (!) 121/47 101/64  Pulse: 75 79  Resp: 20 17  Temp:  36.8 C  SpO2:  93%    Last Pain:  Vitals:   04/22/22 1403  TempSrc: Oral  PainSc: 0-No pain                 Louann Sjogren

## 2022-04-26 ENCOUNTER — Other Ambulatory Visit: Payer: Self-pay | Admitting: Pulmonary Disease

## 2022-04-27 LAB — SURGICAL PATHOLOGY

## 2022-04-29 ENCOUNTER — Encounter (INDEPENDENT_AMBULATORY_CARE_PROVIDER_SITE_OTHER): Payer: Self-pay

## 2022-04-29 ENCOUNTER — Encounter (HOSPITAL_COMMUNITY): Payer: Self-pay | Admitting: Gastroenterology

## 2022-04-29 NOTE — Telephone Encounter (Signed)
I called patient to get more information. She was seen in office on 7/25. Had hida scan that was normal and EGD on 8/31. Dr. Jenetta Downer recommended omeprazole '40mg'$  daily. She reports she is taking daily. Has not seen where it makes a difference. She is feeling better than when seen but still having sharp pain in RUQ and bouts with nausea, vomiting and diarrhea although it has improved some since being seen.

## 2022-05-03 ENCOUNTER — Other Ambulatory Visit (INDEPENDENT_AMBULATORY_CARE_PROVIDER_SITE_OTHER): Payer: Self-pay | Admitting: *Deleted

## 2022-05-03 ENCOUNTER — Encounter (INDEPENDENT_AMBULATORY_CARE_PROVIDER_SITE_OTHER): Payer: Self-pay | Admitting: Gastroenterology

## 2022-05-03 ENCOUNTER — Other Ambulatory Visit (INDEPENDENT_AMBULATORY_CARE_PROVIDER_SITE_OTHER): Payer: Self-pay | Admitting: Gastroenterology

## 2022-05-03 MED ORDER — SUCRALFATE 1 G PO TABS: 1.0000 g | ORAL_TABLET | Freq: Three times a day (TID) | ORAL | 0 refills | Status: DC

## 2022-05-03 NOTE — Telephone Encounter (Signed)
Chelsea, did you want me to send this in and do you want 1 gram one qid and any refills?

## 2022-05-07 NOTE — Progress Notes (Signed)
Concern for H. Pylori causing abdominal pain

## 2022-05-14 ENCOUNTER — Ambulatory Visit (INDEPENDENT_AMBULATORY_CARE_PROVIDER_SITE_OTHER): Payer: BC Managed Care – PPO | Admitting: Internal Medicine

## 2022-05-14 ENCOUNTER — Encounter: Payer: Self-pay | Admitting: Internal Medicine

## 2022-05-14 ENCOUNTER — Other Ambulatory Visit (HOSPITAL_COMMUNITY)
Admission: RE | Admit: 2022-05-14 | Discharge: 2022-05-14 | Disposition: A | Discharge status: Home / Self Care | Payer: BC Managed Care – PPO | Source: Ambulatory Visit | Attending: Internal Medicine | Admitting: Internal Medicine

## 2022-05-14 VITALS — BP 129/77 | HR 76 | Resp 16 | Ht 69.0 in | Wt 220.0 lb

## 2022-05-14 DIAGNOSIS — M25522 Pain in left elbow: Secondary | ICD-10-CM

## 2022-05-14 DIAGNOSIS — Z124 Encounter for screening for malignant neoplasm of cervix: Secondary | ICD-10-CM

## 2022-05-14 DIAGNOSIS — R1011 Right upper quadrant pain: Secondary | ICD-10-CM

## 2022-05-14 DIAGNOSIS — R7303 Prediabetes: Secondary | ICD-10-CM

## 2022-05-14 DIAGNOSIS — E669 Obesity, unspecified: Secondary | ICD-10-CM

## 2022-05-14 DIAGNOSIS — Z0000 Encounter for general adult medical examination without abnormal findings: Secondary | ICD-10-CM

## 2022-05-14 MED ORDER — OZEMPIC (0.25 OR 0.5 MG/DOSE) 2 MG/3ML ~~LOC~~ SOPN: 0.2500 mg | PEN_INJECTOR | SUBCUTANEOUS | 0 refills | Status: DC

## 2022-05-14 NOTE — Patient Instructions (Signed)
It was a pleasure to see you today.  Thank you for giving [place] the opportunity to be involved in your care.  Below is a brief recap of your visit and next steps.  We will plan to see you again in 4 weeks.  Summary We completed your pap smear today I have prescribed Ozempic for weight loss / prediabetes Please find the attached information on tennis elbow  Next steps Follow up in 4 weeks

## 2022-05-14 NOTE — Assessment & Plan Note (Signed)
She endorses pain in the lateral portion of her left elbow that radiates to her left forearm and upper arm.  Pain is worst at the end of the day after working.  There is tenderness to palpation over the lateral epicondyle on exam.  Her symptoms and exam findings seem most consistent with lateral epicondylitis.  We discussed conservative treatment measures for now.  She was provided with home PT exercises.  -Plan to reevaluate in 4 weeks.

## 2022-05-14 NOTE — Assessment & Plan Note (Signed)
BMI 32.  [name] [name] expresses an interest in losing weight, particularly in the context of prediabetes and wanting to do everything she can to prevent progression to diabetes. -Shared decision making, she is in agreement with starting GLP-1 agonist therapy today.  I have prescribed Ozempic 0.25 mg weekly injection x4 weeks.  We will plan for follow-up in 4 weeks for reassessment and plan increase to 0.5 mg weekly at that time.

## 2022-05-14 NOTE — Assessment & Plan Note (Signed)
Recently underwent EGD that demonstrated a small hiatal hernia.  She continues to endorse right upper quadrant pain that is essentially unchanged.  She was told to follow-up with general surgery for further evaluation.

## 2022-05-14 NOTE — Progress Notes (Signed)
Established Patient Office Visit  Subjective   Patient ID: [name] [name], female    DOB: [birth year]  Age: 66 y.o. MRN: [id number]  Chief Complaint  Patient presents with   Follow-up   Gynecologic Exam   [name] [name] is a 66 year old woman returning to care today.  She has a past medical history significant for COPD, anxiety, RLS, OSA, and HLD.  Last seen by me on 04/13/2022 to establish care.  In the interim, she has undergone EGD for evaluation of right upper quadrant abdominal pain.  This showed a 2 cm hiatal hernia.  Today [name] [name] is feeling well. She continues to experience right upper quadrant pain that is unchanged and pattern or severity.  She has been told that she should follow-up with general surgery.  Today she also endorses left lateral elbow pain.  She states that this has gradually increased in intensity over several weeks.  Pain is worse at the end of the day after work.  She has pain radiating into her left forearm and above her elbow as well.  There are no specific movements that elicit pain and she describes it as sore in general.  Past Medical History:  Diagnosis Date   Allergy Mobic   Arthritis    COPD (chronic obstructive pulmonary disease) (HCC)    Emphysema of lung (HCC)    GERD (gastroesophageal reflux disease)    Hard of hearing    Hyperlipidemia 04/13/2022   OCD (obsessive compulsive disorder)    Restless legs syndrome (RLS)    Sleep apnea    Past Surgical History:  Procedure Laterality Date   ABDOMINAL HYSTERECTOMY     BIOPSY  04/22/2022   Procedure: BIOPSY;  Surgeon: Harvel Quale, MD;  Location: [facility];  Service: Gastroenterology;;   CESAREAN SECTION  1996   ESOPHAGOGASTRODUODENOSCOPY (EGD) WITH PROPOFOL N/A 04/22/2022   Procedure: ESOPHAGOGASTRODUODENOSCOPY (EGD) WITH PROPOFOL;  Surgeon: Harvel Quale, MD;  Location: [facility];  Service: Gastroenterology;  Laterality: N/A;  205 ASA 2   FRACTURE SURGERY     foot    JOINT REPLACEMENT     left shoulder   JOINT REPLACEMENT     right shoulder   Social History   Tobacco Use   Smoking status: Former    Packs/day: 1.00    Years: 15.00    Total pack years: 15.00    Types: Cigarettes, E-cigarettes    Quit date: 03/23/2012    Years since quitting: 10.1    Passive exposure: Never   Smokeless tobacco: Never  Substance Use Topics   Alcohol use: Yes    Comment: It's not a weekly occurance   Drug use: Yes    Types: Marijuana   Family History  Problem Relation Age of Onset   Alzheimer's disease Mother    Arthritis Mother    Cancer Mother    Heart attack Father    Heart disease Father    Diabetes Paternal Grandmother    Diabetes Brother    Allergies  Allergen Reactions   Mobic [Meloxicam] Anaphylaxis   Buspirone Anxiety   Review of Systems  Gastrointestinal:  Positive for abdominal pain.       RUQ pain  Musculoskeletal:        Left lateral elbow pain  All other systems reviewed and are negative.    Objective:     BP 129/77   Pulse 76   Resp 16   Ht 5' 9" (1.753 m)   Wt 220  lb (99.8 kg)   SpO2 95%   BMI 32.49 kg/m  BP Readings from Last 3 Encounters:  05/14/22 129/77  04/22/22 101/64  04/13/22 124/69   Physical Exam Exam conducted with a chaperone present.  Constitutional:      Appearance: Normal appearance. She is obese.  HENT:     Head: Normocephalic and atraumatic.     Right Ear: External ear normal.     Left Ear: External ear normal.     Mouth/Throat:     Mouth: Mucous membranes are moist.     Pharynx: No oropharyngeal exudate or posterior oropharyngeal erythema.  Eyes:     General: No scleral icterus.    Conjunctiva/sclera: Conjunctivae normal.     Pupils: Pupils are equal, round, and reactive to light.  Pulmonary:     Effort: Pulmonary effort is normal. No respiratory distress.     Breath sounds: Normal breath sounds. No wheezing, rhonchi or rales.  Abdominal:     General: Abdomen is flat. Bowel sounds are  normal.     Palpations: Abdomen is soft.     Tenderness: There is abdominal tenderness.     Comments: RUQ tenderness palpation  Genitourinary:    General: Normal vulva.     Exam position: Knee-chest position.     Labia:        Right: No tenderness, lesion or injury.        Left: No tenderness, lesion or injury.      Urethra: No urethral swelling or urethral lesion.     Vagina: No vaginal discharge, bleeding or lesions.     Adnexa: Right adnexa normal and left adnexa normal.     Comments: Limited assessment due to discomfort Musculoskeletal:     Comments: There is tenderness palpation of the left lateral epicondyle.  ROM of the elbow is normal.  Skin:    General: Skin is warm and dry.  Neurological:     General: No focal deficit present.     Mental Status: She is alert and oriented to person, place, and time.  Psychiatric:        Mood and Affect: Mood normal.        Behavior: Behavior normal.    Last CBC Lab Results  Component Value Date   WBC 6.1 04/13/2022   HGB 12.3 04/13/2022   HCT 38.7 04/13/2022   MCV 91 04/13/2022   MCH 28.9 04/13/2022   RDW 13.2 04/13/2022   PLT 192 30/02/6225   Last metabolic panel Lab Results  Component Value Date   GLUCOSE 90 04/20/2022   NA 144 04/20/2022   K 4.7 04/20/2022   CL 104 04/20/2022   CO2 24 04/20/2022   BUN 11 04/20/2022   CREATININE 0.79 04/20/2022   EGFR 83 04/20/2022   CALCIUM 9.1 04/20/2022   PROT 6.9 04/13/2022   ALBUMIN 4.2 04/13/2022   LABGLOB 2.7 04/13/2022   AGRATIO 1.6 04/13/2022   BILITOT 0.4 04/13/2022   ALKPHOS 105 04/13/2022   AST 11 04/13/2022   ALT 8 04/13/2022   Last lipids Lab Results  Component Value Date   CHOL 158 04/13/2022   HDL 44 04/13/2022   LDLCALC 90 04/13/2022   TRIG 135 04/13/2022   CHOLHDL 3.6 04/13/2022   Last hemoglobin A1c Lab Results  Component Value Date   HGBA1C 5.7 (H) 04/13/2022   Last thyroid functions Lab Results  Component Value Date   TSH 2.860 04/13/2022    Last vitamin D Lab Results  Component Value Date  VD25OH 38.4 04/13/2022   Last vitamin B12 and Folate Lab Results  Component Value Date   VITAMINB12 372 04/13/2022   FOLATE 2.4 (L) 04/13/2022   The 10-year ASCVD risk score (Arnett DK, et al., 2019) is: 4.9%    Assessment & Plan:   Problem List Items Addressed This Visit     Obesity (BMI 30-39.9)    BMI 32.  [name] [name] expresses an interest in losing weight, particularly in the context of prediabetes and wanting to do everything she can to prevent progression to diabetes. -Shared decision making, she is in agreement with starting GLP-1 agonist therapy today.  I have prescribed Ozempic 0.25 mg weekly injection x4 weeks.  We will plan for follow-up in 4 weeks for reassessment and plan increase to 0.5 mg weekly at that time.      Relevant Medications   Semaglutide,0.25 or 0.5MG/DOS, (OZEMPIC, 0.25 OR 0.5 MG/DOSE,) 2 MG/3ML SOPN   Preventative health care    Returning for follow-up today.  Recent labs reviewed. -Pap smear completed today -States that she needs a referral for a colonoscopy even though she is already established with gastroenterology. -She is potentially interested in receiving the RSV vaccine.  We will discuss this at follow-up in 4 weeks.      Colicky RUQ abdominal pain    Recently underwent EGD that demonstrated a small hiatal hernia.  She continues to endorse right upper quadrant pain that is essentially unchanged.  She was told to follow-up with general surgery for further evaluation.      Left elbow pain    She endorses pain in the lateral portion of her left elbow that radiates to her left forearm and upper arm.  Pain is worst at the end of the day after working.  There is tenderness to palpation over the lateral epicondyle on exam.  Her symptoms and exam findings seem most consistent with lateral epicondylitis.  We discussed conservative treatment measures for now.  She was provided with home PT exercises.   -Plan to reevaluate in 4 weeks.      Return in about 4 weeks (around 06/11/2022).    Johnette Abraham, MD

## 2022-05-14 NOTE — Assessment & Plan Note (Signed)
Returning for follow-up today.  Recent labs reviewed. -Pap smear completed today -States that she needs a referral for a colonoscopy even though she is already established with gastroenterology. -She is potentially interested in receiving the RSV vaccine.  We will discuss this at follow-up in 4 weeks.

## 2022-05-19 LAB — CYTOLOGY - PAP
Comment: NEGATIVE
Diagnosis: UNDETERMINED — AB
High risk HPV: NEGATIVE

## 2022-05-25 ENCOUNTER — Other Ambulatory Visit: Payer: Self-pay

## 2022-05-25 ENCOUNTER — Encounter: Payer: Self-pay | Admitting: Adult Health

## 2022-05-25 DIAGNOSIS — R87610 Atypical squamous cells of undetermined significance on cytologic smear of cervix (ASC-US): Secondary | ICD-10-CM

## 2022-05-25 MED ORDER — WEGOVY 0.25 MG/0.5ML ~~LOC~~ SOAJ
0.2500 mg | SUBCUTANEOUS | 0 refills | Status: AC
Start: 2022-05-25 — End: 2022-06-16

## 2022-05-25 NOTE — Telephone Encounter (Signed)
Ok to provide

## 2022-05-25 NOTE — Telephone Encounter (Deleted)
Ok to provide

## 2022-05-30 ENCOUNTER — Other Ambulatory Visit (INDEPENDENT_AMBULATORY_CARE_PROVIDER_SITE_OTHER): Payer: Self-pay | Admitting: Gastroenterology

## 2022-05-31 ENCOUNTER — Other Ambulatory Visit (INDEPENDENT_AMBULATORY_CARE_PROVIDER_SITE_OTHER): Payer: Self-pay | Admitting: Gastroenterology

## 2022-05-31 ENCOUNTER — Encounter: Payer: Self-pay | Admitting: Neurology

## 2022-06-11 ENCOUNTER — Ambulatory Visit: Payer: BC Managed Care – PPO | Admitting: Internal Medicine

## 2022-06-11 ENCOUNTER — Ambulatory Visit: Payer: Self-pay | Admitting: Pulmonary Disease

## 2022-06-14 ENCOUNTER — Encounter: Payer: Self-pay | Admitting: Internal Medicine

## 2022-06-14 ENCOUNTER — Ambulatory Visit (INDEPENDENT_AMBULATORY_CARE_PROVIDER_SITE_OTHER): Payer: BC Managed Care – PPO | Admitting: Internal Medicine

## 2022-06-14 ENCOUNTER — Other Ambulatory Visit: Payer: Self-pay | Admitting: Family Medicine

## 2022-06-14 VITALS — BP 168/71 | HR 87 | Ht 69.0 in | Wt 221.8 lb

## 2022-06-14 DIAGNOSIS — E785 Hyperlipidemia, unspecified: Secondary | ICD-10-CM

## 2022-06-14 DIAGNOSIS — G2581 Restless legs syndrome: Secondary | ICD-10-CM

## 2022-06-14 DIAGNOSIS — Z1211 Encounter for screening for malignant neoplasm of colon: Secondary | ICD-10-CM | POA: Diagnosis not present

## 2022-06-14 DIAGNOSIS — E669 Obesity, unspecified: Secondary | ICD-10-CM | POA: Diagnosis not present

## 2022-06-14 DIAGNOSIS — M25522 Pain in left elbow: Secondary | ICD-10-CM

## 2022-06-14 DIAGNOSIS — Z1212 Encounter for screening for malignant neoplasm of rectum: Secondary | ICD-10-CM

## 2022-06-14 HISTORY — DX: Encounter for screening for malignant neoplasm of colon: Z12.11

## 2022-06-14 MED ORDER — ROSUVASTATIN CALCIUM 5 MG PO TABS: 5.0000 mg | ORAL_TABLET | Freq: Every day | ORAL | 3 refills | Status: DC

## 2022-06-14 NOTE — Assessment & Plan Note (Signed)
She is currently followed by neurology and prescribed gabapentin and ropinirole for management of RLS.  She expresses frustration today over poorly controlled symptoms and is interested in starting Norco for symptom relief.  I told [name] [name] that I would not be ordering any medications today and recommended that she discuss her concerns with her neurologist.  She has follow-up scheduled for next month.

## 2022-06-14 NOTE — Assessment & Plan Note (Signed)
BMI 32.7.  She was prescribed Wegovy recently but did not realize that it had been approved.  She has not started the medication.  She plans to fill the prescription this evening.  We will plan for follow-up in 4 weeks

## 2022-06-14 NOTE — Assessment & Plan Note (Signed)
Left lateral epicondylitis.  Symptoms unchanged from her last appointment, however she did not complete home PT exercises as previously recommended.  She has been wearing a compression band, which seems to improve her pain. -Treatment options reviewed, she would like to attempt home PT for now.  She will continue use of Tylenol and Voltaren gel.  If there is no improvement in her pain after 4 weeks, she is interested in referral to sports medicine to discuss further treatment options.

## 2022-06-14 NOTE — Assessment & Plan Note (Signed)
Cologuard ordered today for colorectal cancer screening.

## 2022-06-14 NOTE — Patient Instructions (Signed)
It was a pleasure to see you today.  Thank you for giving [place] the opportunity to be involved in your care.  Below is a brief recap of your visit and next steps.  We will plan to see you again in 3 months.  Summary I have refilled rosuvastatin today and ordered Cologuard for colon cancer screening. Please see the attached home PT exercises for your elbow. If your pain is not improving after a few weeks, please let me know.  Next steps Follow up in 3 months

## 2022-06-14 NOTE — Progress Notes (Signed)
Established Patient Office Visit  Subjective   Patient ID: [name] [name], female    DOB: [birth year]  Age: 66 y.o. MRN: [id number]  Chief Complaint  Patient presents with   Follow-up   [name] [name] returns to care today.  She is a 66 year old woman with a past medical history significant for COPD, anxiety, RLS, OSA, HLD, and obesity.  She was last seen by me on 05/14/2022 at which time her Pap smear was completed and she endorsed left lateral elbow pain.  Home PT exercises were recommended.  She was also prescribed Ozempic 0.25 mg weekly injections for management of obesity.  This was since changed to [facility], which she has been preauthorized for.  Her Pap smear resulted with ASCUS and she was referred to OB/GYN.  There has not otherwise been any acute events.  Today [name] [name] states that her left lateral elbow pain is unchanged.  She has a compression armband on that helps with her pain.  She did not perform previously recommended home PT exercises and has been managing her pain with Tylenol.  [name] [name] feels that her restless leg symptoms are poorly controlled currently.  She will see neurology next month and would like to discuss starting Norco.  She received a brief prescription for Norco when experiencing right upper quadrant abdominal pain, which seemed to significantly improve her RLS symptoms.  She is interested in starting Norco for relief of RLS symptoms as she is concerned about chronic use of gabapentin at her current dosage.  She references an RLS support group on Facebook that has scientific based information supporting use of hydrocodone for treatment of RLS.  [name] [name] was also unaware that Mancel Parsons had been approved for treatment of obesity.  She plans to attempt to fill the prescription later today.  Acute concerns, chronic medical conditions, and outstanding preventative healthcare maintenance items discussed today are individually addressed in A/P below.  Past Medical History:   Diagnosis Date   Allergy Mobic   Arthritis    COPD (chronic obstructive pulmonary disease) (HCC)    Emphysema of lung ([place])    GERD (gastroesophageal reflux disease)    Hard of hearing    Hyperlipidemia 04/13/2022   OCD (obsessive compulsive disorder)    Restless legs syndrome (RLS)    Sleep apnea    Past Surgical History:  Procedure Laterality Date   ABDOMINAL HYSTERECTOMY     BIOPSY  04/22/2022   Procedure: BIOPSY;  Surgeon: Harvel Quale, MD;  Location: [facility];  Service: Gastroenterology;;   [place]   ESOPHAGOGASTRODUODENOSCOPY (EGD) WITH PROPOFOL N/A 04/22/2022   Procedure: ESOPHAGOGASTRODUODENOSCOPY (EGD) WITH PROPOFOL;  Surgeon: Harvel Quale, MD;  Location: [facility];  Service: Gastroenterology;  Laterality: N/A;  205 ASA 2   FRACTURE SURGERY     foot   JOINT REPLACEMENT     left shoulder   JOINT REPLACEMENT     right shoulder   Social History   Tobacco Use   Smoking status: Former    Packs/day: 1.00    Years: 15.00    Total pack years: 15.00    Types: Cigarettes, E-cigarettes    Quit date: 03/23/2012    Years since quitting: 10.2    Passive exposure: Never   Smokeless tobacco: Never  Substance Use Topics   Alcohol use: Yes    Comment: It's not a weekly occurance   Drug use: Yes    Types: Marijuana   Family History  Problem  Relation Age of Onset   Alzheimer's disease Mother    Arthritis Mother    Cancer Mother    Heart attack Father    Heart disease Father    Diabetes Paternal Grandmother    Diabetes Brother    Allergies  Allergen Reactions   Mobic [Meloxicam] Anaphylaxis   Buspirone Anxiety   Review of Systems  Musculoskeletal:  Positive for joint pain (Left lateral epicondyle pain).  Neurological:        RLS symptoms worsening     Objective:     BP (!) 168/71   Pulse 87   Ht _0  (1.753 m)   Wt 221 lb 12.8 oz (100.6 kg)   SpO2 96%   BMI 32.75 kg/m  BP Readings from Last 3 Encounters:   06/14/22 (!) 168/71  05/14/22 129/77  04/22/22 101/64      Physical Exam Constitutional:      General: She is not in acute distress.    Appearance: Normal appearance. She is obese. She is not toxic-appearing.  HENT:     Head: Normocephalic and atraumatic.     Right Ear: External ear normal.     Left Ear: External ear normal.     Nose: Nose normal. No congestion or rhinorrhea.     Mouth/Throat:     Mouth: Mucous membranes are moist.     Pharynx: Oropharynx is clear. No oropharyngeal exudate or posterior oropharyngeal erythema.  Eyes:     General: No scleral icterus.    Extraocular Movements: Extraocular movements intact.     Conjunctiva/sclera: Conjunctivae normal.     Pupils: Pupils are equal, round, and reactive to light.  Cardiovascular:     Rate and Rhythm: Normal rate and regular rhythm.     Pulses: Normal pulses.     Heart sounds: Normal heart sounds. No murmur heard.    No friction rub. No gallop.  Pulmonary:     Effort: Pulmonary effort is normal.     Breath sounds: Normal breath sounds. No wheezing, rhonchi or rales.  Abdominal:     General: Abdomen is flat. Bowel sounds are normal.     Palpations: Abdomen is soft.  Musculoskeletal:        General: Tenderness (Tenderness palpation over the left lateral epicondyle) present. No swelling.     Cervical back: Normal range of motion.     Right lower leg: No edema.     Left lower leg: No edema.  Lymphadenopathy:     Cervical: No cervical adenopathy.  Skin:    General: Skin is warm and dry.     Capillary Refill: Capillary refill takes less than 2 seconds.     Coloration: Skin is not jaundiced.  Neurological:     General: No focal deficit present.     Mental Status: She is alert and oriented to person, place, and time.  Psychiatric:        Mood and Affect: Mood normal.        Behavior: Behavior normal.    Last CBC Lab Results  Component Value Date   WBC 6.1 04/13/2022   HGB 12.3 04/13/2022   HCT 38.7  04/13/2022   MCV 91 04/13/2022   MCH 28.9 04/13/2022   RDW 13.2 04/13/2022   PLT 192 32/07/2481   Last metabolic panel Lab Results  Component Value Date   GLUCOSE 90 04/20/2022   NA 144 04/20/2022   K 4.7 04/20/2022   CL 104 04/20/2022   CO2 24 04/20/2022  BUN 11 04/20/2022   CREATININE 0.79 04/20/2022   EGFR 83 04/20/2022   CALCIUM 9.1 04/20/2022   PROT 6.9 04/13/2022   ALBUMIN 4.2 04/13/2022   LABGLOB 2.7 04/13/2022   AGRATIO 1.6 04/13/2022   BILITOT 0.4 04/13/2022   ALKPHOS 105 04/13/2022   AST 11 04/13/2022   ALT 8 04/13/2022   Last lipids Lab Results  Component Value Date   CHOL 158 04/13/2022   HDL 44 04/13/2022   LDLCALC 90 04/13/2022   TRIG 135 04/13/2022   CHOLHDL 3.6 04/13/2022   Last hemoglobin A1c Lab Results  Component Value Date   HGBA1C 5.7 (H) 04/13/2022   Last thyroid functions Lab Results  Component Value Date   TSH 2.860 04/13/2022   Last vitamin D Lab Results  Component Value Date   VD25OH 38.4 04/13/2022   Last vitamin B12 and Folate Lab Results  Component Value Date   VITAMINB12 372 04/13/2022   FOLATE 2.4 (L) 04/13/2022     Assessment & Plan:   Problem List Items Addressed This Visit       Obesity (BMI 30-39.9)    BMI 32.7.  She was prescribed Wegovy recently but did not realize that it had been approved.  She has not started the medication.  She plans to fill the prescription this evening.  We will plan for follow-up in 4 weeks      Restless leg syndrome    She is currently followed by neurology and prescribed gabapentin and ropinirole for management of RLS.  She expresses frustration today over poorly controlled symptoms and is interested in starting Norco for symptom relief.  I told Ms. Yamaguchi that I would not be ordering any medications today and recommended that she discuss her concerns with her neurologist.  She has follow-up scheduled for next month.      Left elbow pain    Left lateral epicondylitis.  Symptoms  unchanged from her last appointment, however she did not complete home PT exercises as previously recommended.  She has been wearing a compression band, which seems to improve her pain. -Treatment options reviewed, she would like to attempt home PT for now.  She will continue use of Tylenol and Voltaren gel.  If there is no improvement in her pain after 4 weeks, she is interested in referral to sports medicine to discuss further treatment options.      Screening for colorectal cancer - Primary    Cologuard ordered today for colorectal cancer screening.       Return in about 4 weeks (around 07/12/2022).    Johnette Abraham, MD

## 2022-06-21 ENCOUNTER — Encounter: Payer: Self-pay | Admitting: Pulmonary Disease

## 2022-06-21 ENCOUNTER — Ambulatory Visit (INDEPENDENT_AMBULATORY_CARE_PROVIDER_SITE_OTHER): Payer: BC Managed Care – PPO | Admitting: Pulmonary Disease

## 2022-06-21 VITALS — BP 136/82 | HR 78 | Temp 98.7°F | Ht 68.25 in | Wt 224.0 lb

## 2022-06-21 DIAGNOSIS — G2581 Restless legs syndrome: Secondary | ICD-10-CM

## 2022-06-21 DIAGNOSIS — J449 Chronic obstructive pulmonary disease, unspecified: Secondary | ICD-10-CM

## 2022-06-21 DIAGNOSIS — G4733 Obstructive sleep apnea (adult) (pediatric): Secondary | ICD-10-CM

## 2022-06-21 DIAGNOSIS — R0602 Shortness of breath: Secondary | ICD-10-CM | POA: Diagnosis not present

## 2022-06-21 MED ORDER — CLONAZEPAM 0.25 MG PO TBDP: 0.2500 mg | ORAL_TABLET | Freq: Every day | ORAL | 0 refills | Status: DC

## 2022-06-21 NOTE — Assessment & Plan Note (Signed)
OSA is very mild and improves with positional therapy.  Unfortunately she did not tolerate oral appliance. We discussed continuing CPAP for now with a fullface mask with weight loss as a long-term goal

## 2022-06-21 NOTE — Assessment & Plan Note (Signed)
Continue Trelegy We will try to obtain her previous PFTs to review

## 2022-06-21 NOTE — Assessment & Plan Note (Signed)
This seems to be her main issue.  Was being managed by neurology We will obtain iron panel.  She seems to be going through an augmentation process. She is having side effects from high-dose gabapentin.  We will add low-dose clonazepam 0.25 mg at bedtime and see if we can decrease dose of Requip further to help with augmentation.  I also asked her to consider using leg massager, unfortunately relaxis has been discontinued

## 2022-06-21 NOTE — Progress Notes (Signed)
Subjective:    Patient ID: [name] [name], female    DOB: [birth year], 66 y.o.   MRN: [id number]  HPI  66 year old ex-smoker who presents to establish care for COPD, OSA and restless leg syndrome. She has seen my partner Dr. Gala Murdoch at our [facility] location , last 11/2021  Chief Complaint  Patient presents with   New Patient (Initial Visit)    Switching from Dr. Jenetta Downer for convenience.  Wants to discuss sleep study results    RLS was diagnosed in her 30s -she has been maintained on Requip 4 mg, before bedtime, gabapentin 600 mg up to 5 tablets a day .  She reports that gabapentin makes her feel like a zombie in the daytime and has affected her balance.  She has tried Neupro patch but could not tolerate side effects And noted ferritin level was 17 and 09/2021 when she used iron tablets for a while.  OSA was diagnosed in the past, mild, oral appliance was tried which she did not tolerate she is finally settled down with a [employer] DreamWear fullface mask.  We reviewed sleep study.  She smoked about a pack per day for more than 20 years before she quit in 2013, more than 20 pack years.  PFTs were performed which are not available to me, she was maintained on Advair and Spiriva and this was switched to Trelegy.    Significant tests/ events reviewed  LDCT 08/2020 -Previous nodules resolved, 4 mm nodule in the lingula   06/2021 >> NPSG >>AHI 11/h, predom supine, low sat 84% (220 lbs )  RBD noticed throughout study/sleep talking and waking up confused. Significant leg and body jerks noticed during testing   Obstructive sleep apnea with AHI of 14, RDI of 27, oxygen nadir of 87% -Titrated to a CPAP and subsequently to a BiPAP of 20/16   Past Medical History:  Diagnosis Date   Allergy Mobic   Arthritis    COPD (chronic obstructive pulmonary disease) (HCC)    Emphysema of lung (HCC)    GERD (gastroesophageal reflux disease)    Hard of hearing    Hyperlipidemia 04/13/2022   OCD (obsessive  compulsive disorder)    Restless legs syndrome (RLS)    Sleep apnea     Past Surgical History:  Procedure Laterality Date   ABDOMINAL HYSTERECTOMY     BIOPSY  04/22/2022   Procedure: BIOPSY;  Surgeon: Harvel Quale, MD;  Location: [facility];  Service: Gastroenterology;;   CESAREAN SECTION  1996   ESOPHAGOGASTRODUODENOSCOPY (EGD) WITH PROPOFOL N/A 04/22/2022   Procedure: ESOPHAGOGASTRODUODENOSCOPY (EGD) WITH PROPOFOL;  Surgeon: Harvel Quale, MD;  Location: [facility];  Service: Gastroenterology;  Laterality: N/A;  205 ASA 2   FRACTURE SURGERY     foot   JOINT REPLACEMENT     left shoulder   JOINT REPLACEMENT     right shoulder    Allergies  Allergen Reactions   Mobic [Meloxicam] Anaphylaxis   Buspirone Anxiety   Social History   Socioeconomic History   Marital status: Single    Spouse name: Not on file   Number of children: 2   Years of education: college   Highest education level: Not on file  Occupational History   Occupation: Sales Rep  Tobacco Use   Smoking status: Former    Packs/day: 1.00    Years: 15.00    Total pack years: 15.00    Types: Cigarettes, E-cigarettes    Quit date: 03/23/2012  Years since quitting: 10.2    Passive exposure: Never   Smokeless tobacco: Never  Substance and Sexual Activity   Alcohol use: Yes    Comment: It's not a weekly occurance   Drug use: Yes    Types: Marijuana   Sexual activity: Not on file  Other Topics Concern   Not on file  Social History Narrative   Lives alone.   Caffeine use: occasional use   Right-handed.   Social Determinants of Health   Financial Resource Strain: Not on file  Food Insecurity: Not on file  Transportation Needs: Not on file  Physical Activity: Not on file  Stress: Not on file  Social Connections: Not on file  Intimate Partner Violence: Not on file    Family History  Problem Relation Age of Onset   Alzheimer's disease Mother    Arthritis Mother     Cancer Mother    Heart attack Father    Heart disease Father    Diabetes Paternal Grandmother    Diabetes Brother      Review of Systems neg for any significant sore throat, dysphagia, itching, sneezing, nasal congestion or excess/ purulent secretions, fever, chills, sweats, unintended wt loss, pleuritic or exertional cp, hempoptysis, orthopnea pnd or change in chronic leg swelling. Also denies presyncope, palpitations, heartburn, abdominal pain, nausea, vomiting, diarrhea or change in bowel or urinary habits, dysuria,hematuria, rash, arthralgias, visual complaints, headache, numbness weakness or ataxia.     Objective:   Physical Exam   Gen. Pleasant, obese, in no distress, normal affect ENT - no pallor,icterus, no post nasal drip, class 2 airway Neck: No JVD, no thyromegaly, no carotid bruits Lungs: no use of accessory muscles, no dullness to percussion, decreased without rales or rhonchi  Cardiovascular: Rhythm regular, heart sounds  normal, no murmurs or gallops, no peripheral edema Abdomen: soft and non-tender, no hepatosplenomegaly, BS normal. Musculoskeletal: No deformities, no cyanosis or clubbing Neuro:  alert, non focal, no tremors        Assessment & Plan:

## 2022-06-21 NOTE — Patient Instructions (Signed)
  X iron panel   X Rx for clonazepam 0.25 mg 1 h prior to bedtime  X Device - Revitive , ( Relaxis has been discontinued)

## 2022-06-22 ENCOUNTER — Telehealth: Payer: Self-pay | Admitting: Pulmonary Disease

## 2022-06-22 LAB — IRON,TIBC AND FERRITIN PANEL
Ferritin: 19 ng/mL (ref 15–150)
Iron Saturation: 11 % — ABNORMAL LOW (ref 15–55)
Iron: 36 ug/dL (ref 27–139)
Total Iron Binding Capacity: 314 ug/dL (ref 250–450)
UIBC: 278 ug/dL (ref 118–369)

## 2022-06-23 DIAGNOSIS — Z1212 Encounter for screening for malignant neoplasm of rectum: Secondary | ICD-10-CM | POA: Diagnosis not present

## 2022-06-23 DIAGNOSIS — Z1211 Encounter for screening for malignant neoplasm of colon: Secondary | ICD-10-CM | POA: Diagnosis not present

## 2022-06-23 NOTE — Telephone Encounter (Signed)
Rigoberto Noel, MD  06/22/2022 10:47 AM EDT     Iron saturation and ferritin was low. She should take iron gluconate tablet 1 daily for 3 months   ATC patient with results. Left a detailed vm on primary phone (ok per dpr [phone]) letting her know results and recommendations. Advised patient to call back if she had any questions about instructions/ concerns. Nothing further needed for now.

## 2022-07-02 ENCOUNTER — Other Ambulatory Visit: Payer: Self-pay | Admitting: Internal Medicine

## 2022-07-02 DIAGNOSIS — R195 Other fecal abnormalities: Secondary | ICD-10-CM

## 2022-07-02 LAB — COLOGUARD: COLOGUARD: POSITIVE — AB

## 2022-07-12 ENCOUNTER — Ambulatory Visit: Payer: BC Managed Care – PPO | Admitting: Internal Medicine

## 2022-07-12 ENCOUNTER — Encounter: Payer: Self-pay | Admitting: Internal Medicine

## 2022-07-20 ENCOUNTER — Ambulatory Visit: Payer: BC Managed Care – PPO | Admitting: Adult Health

## 2022-07-22 NOTE — Progress Notes (Deleted)
No chief complaint on file.     ASSESSMENT AND PLAN  [name] [name] is a 66 y.o. female   Severe restless leg syndrome with augmentation  Much improved with medication adjustment, but still has difficult night,  Will add on low-dose Neupro patch 2 g / 24 hours,  Keep Requip 4 mg, before bedtime, gabapentin 600 mg up to 5 tablets a day  Anemia, hemoglobin of 10.8, ferritin of 17, suggest iron supplement,  Patient reported that she never had recommended colonoscopy, suggestive of follow-up with primary care, potential GI physician  Periodic leg movement disorder, snoring, excessive daytime sleepiness, fatigue  Referred for sleep study    Elevated M protein, level was 1.7  We will repeat laboratory evaluations - was not completed at prior visit  B12 deficiency  Needs B12 IM supplement, B12 372 03/2022  DIAGNOSTIC DATA (LABS, IMAGING, TESTING) - I reviewed patient records, labs, notes, testing and imaging myself where available.  Laboratory evaluations in October 2022,  B12 255, ESR C-reactive protein rheumatoid factor, vitamin D 34, normal CMP    MEDICAL HISTORY:  [name] [name], is a 66 year old female, seen in request by Dr. Andrena Mews T, for evaluation of restless leg syndrome, her primary care physician is Dr. Doren Custard, Hazle Nordmann, MD, she is accompanied by her friend Corey Skains at today's visit on October 09, 2021  I reviewed and summarized the referring note. PMHx COPD, smoker in the past OCD HLD GERD Obstructive sleep apnea, use CPAP Bilateral shoulder replacement,  right Dec 2022, Nov 2021  Consult visit 10/09/2021 Dr. Krista Blue: She reported restless leg syndrome since 1995, initially she has the urge to kick her leg, move her leg when sitting still, if she turns her neck in certain way it will help,  Eventually she was treated with Requip, low-dose at the beginning, gradually titrating to 4 mg every night around 2019, it used to be able to control her restless leg  symptoms well  But she began to experience increased restless leg syndrome around 2020, especially since 2022, she is now taking Requip 4 mg and the gabapentin 300 mg at 6 PM, another gabapentin 1 to 200 mg during the day,   She complains such severe restless leg symptoms, she has to carve herself in a fetal position put pressure on her knee and lacks to falling to sleep,  She also have significant periodic leg movement disorder, woke up oftentimes her bed has moved few inches away from its original position, when she had bilateral shoulder replacement, she lived with her friends, who observed her almost constant bilateral neck, arm movement during her sleep, sometimes abnormal movements will wake her up  She also has to get out of the car couple times if they drive for 4 hours, she cannot even finish a meal with her friend without pacing around  She denies persistent left lower extremity sensory or motor deficit, denies gait abnormality   Over the past few years, she also developed gradual worsening hearing loss, never had a imaging study of her brain  She also has neck pain, radiating pain to bilateral shoulder despite bilateral shoulder replacements surgery  UPDATE Jan 11 2022 Dr. Krista Blue: She reported significant improvement with adjustment of her medications, she is now taking gabapentin 600 mg up to 5 tablets a day, Requip 4 mg at 5:30 PM, occasionally clonazepam  Laboratory evaluations showed normal negative copper, RPR, CPK, mild anemia, hemoglobin of 10.8, ferritin level was 17, M protein was 1.7,  elevated, negative ANA, A1c was 5.8, B12 was low 177, normal C-reactive protein, TSH,  She does complains of excessive daytime sleepiness, snoring,  Personally reviewed MRI of brain with and without contrast November 03, 2021 that was normal Cervical spine showed multilevel degenerative changes , no significant canal stenosis, right C3 and C5 moderate to severe foraminal narrowing   Update  07/26/2022 JM:   At prior visit, Dr. Krista Blue added low-dose Neupro patch in addition to Requip and gabapentin   Has not yet completed sleep evaluation         PHYSICAL EXAM:   There were no vitals filed for this visit.     There is no height or weight on file to calculate BMI.  PHYSICAL EXAMNIATION:  Gen: NAD, conversant, well nourised, well groomed                     Cardiovascular: Regular rate rhythm, no peripheral edema, warm, nontender. Eyes: Conjunctivae clear without exudates or hemorrhage Neck: Supple, no carotid bruits. Pulmonary: Clear to auscultation bilaterally   NEUROLOGICAL EXAM:  MENTAL STATUS: Speech/cognition: Awake, alert, oriented to history taking and casual conversation   CRANIAL NERVES: CN II: Visual fields are full to confrontation. Pupils are round equal and briskly reactive to light. CN III, IV, VI: extraocular movement are normal. No ptosis. CN V: Facial sensation is intact to light touch CN VII: Face is symmetric with normal eye closure  CN VIII: Hard of hearing, air conduction more than bony conduction bilaterally, Weber signs in the midline CN IX, X: Phonation is normal. CN XI: Head turning and shoulder shrug are intact  MOTOR: There is no pronator drift of out-stretched arms. Muscle bulk and tone are normal. Muscle strength is normal.  REFLEXES: Reflexes are 2+ and symmetric at the biceps, triceps, 3/3 knees, and ankles. Plantar responses are extensor bilaterally  SENSORY: Decreased vibratory sensation at toes, preserved fine touch, pinprick  COORDINATION: There is no trunk or limb dysmetria noted.  GAIT/STANCE: Posture is normal. Gait is steady with normal steps, base, arm swing, and turning. Heel and toe walking are normal. Tandem gait is normal.  Romberg is absent.  REVIEW OF SYSTEMS:  Full 14 system review of systems performed and notable only for as above All other review of systems were negative.   ALLERGIES: Allergies   Allergen Reactions   Mobic [Meloxicam] Anaphylaxis   Buspirone Anxiety    HOME MEDICATIONS: Current Outpatient Medications  Medication Sig Dispense Refill   albuterol (VENTOLIN HFA) 108 (90 Base) MCG/ACT inhaler INHALE 2 PUFFS INTO THE LUNGS EVERY 6 HOURS AS NEEDED FOR WHEEZING OR SHORTNESS OF BREATH 6.7 g 2   citalopram (CELEXA) 40 MG tablet Take 40 mg by mouth daily.     clonazePAM (KLONOPIN) 0.25 MG disintegrating tablet Take 1 tablet (0.25 mg total) by mouth at bedtime. 60 tablet 0   cyanocobalamin (VITAMIN B12) 1000 MCG/ML injection 1ML ONCE MONTHLY. 22 mL 0   Fluticasone-Umeclidin-Vilant (TRELEGY ELLIPTA) 200-62.5-25 MCG/ACT AEPB Inhale 1 puff into the lungs daily. 60 each 5   gabapentin (NEURONTIN) 600 MG tablet One tab tid +2 extra tabs qhs. (Patient taking differently: Take 600 mg by mouth in the morning, at noon, in the evening, and at bedtime.) 450 tablet 3   HYDROcodone-acetaminophen (NORCO/VICODIN) 5-325 MG tablet Take 1-2 tablets by mouth every 6 (six) hours as needed for moderate pain or severe pain.     NEEDLE, DISP, 23 G (BD DISP NEEDLE) 23G X 1" MISC  Needle used for B12 IM injections. 22 each 0   omeprazole (PRILOSEC) 40 MG capsule Take 1 capsule (40 mg total) by mouth daily. 90 capsule 3   ondansetron (ZOFRAN-ODT) 4 MG disintegrating tablet Take 4 mg by mouth every 12 (twelve) hours as needed for nausea/vomiting.     prochlorperazine (COMPAZINE) 10 MG tablet Take 10 mg by mouth 3 (three) times daily as needed for nausea/vomiting.     promethazine (PHENERGAN) 25 MG tablet Take 25 mg by mouth every 6 (six) hours as needed for nausea/vomiting.     rOPINIRole (REQUIP) 4 MG tablet Take 1 tablet (4 mg total) by mouth at bedtime. 90 tablet 3   rosuvastatin (CRESTOR) 5 MG tablet Take 1 tablet (5 mg total) by mouth daily. 90 tablet 3   sucralfate (CARAFATE) 1 g tablet TAKE 1 TABLET(1 GRAM) BY MOUTH FOUR TIMES DAILY AT BEDTIME WITH MEALS 82 tablet 0   SYRINGE-NEEDLE, DISP, 3 ML (B-D  3CC LUER-LOK SYR 18GX1-1/2) 18G X 1-1/2" 3 ML MISC Syringe/needle to draw up B12 injections. 22 each 0   No current facility-administered medications for this visit.    PAST MEDICAL HISTORY: Past Medical History:  Diagnosis Date   Allergy Mobic   Arthritis    COPD (chronic obstructive pulmonary disease) ([place])    Emphysema of lung ([place])    GERD (gastroesophageal reflux disease)    Hard of hearing    Hyperlipidemia 04/13/2022   OCD (obsessive compulsive disorder)    Restless legs syndrome (RLS)    Sleep apnea     PAST SURGICAL HISTORY: Past Surgical History:  Procedure Laterality Date   ABDOMINAL HYSTERECTOMY     BIOPSY  04/22/2022   Procedure: BIOPSY;  Surgeon: Harvel Quale, MD;  [facility]: [facility];  Service: Gastroenterology;;   CESAREAN SECTION  1996   ESOPHAGOGASTRODUODENOSCOPY (EGD) WITH PROPOFOL N/A 04/22/2022   Procedure: ESOPHAGOGASTRODUODENOSCOPY (EGD) WITH PROPOFOL;  Surgeon: Harvel Quale, MD;  [facility];  Service: Gastroenterology;  Laterality: N/A;  205 ASA 2   FRACTURE SURGERY     foot   JOINT REPLACEMENT     left shoulder   JOINT REPLACEMENT     right shoulder    FAMILY HISTORY: Family History  Problem Relation Age of Onset   Alzheimer's disease Mother    Arthritis Mother    Cancer Mother    Heart attack Father    Heart disease Father    Diabetes Paternal Grandmother    Diabetes Brother     SOCIAL HISTORY: Social History   Socioeconomic History   Marital status: Single    Spouse name: Not on file   Number of children: 2   Years of education: college   Highest education level: Not on file  Occupational History   Occupation: Press photographer Rep  Tobacco Use   Smoking status: Former    Packs/day: 1.00    Years: 15.00    Total pack years: 15.00    Types: Cigarettes, E-cigarettes    Quit date: 03/23/2012    Years since quitting: 10.3    Passive exposure: Never   Smokeless tobacco: Never  Substance and Sexual  Activity   Alcohol use: Yes    Comment: It's not a weekly occurance   Drug use: Yes    Types: Marijuana   Sexual activity: Not on file  Other Topics Concern   Not on file  Social History Narrative   Lives alone.   Caffeine use: occasional use   Right-handed.  Social Determinants of Health   Financial Resource Strain: Not on file  Food Insecurity: Not on file  Transportation Needs: Not on file  Physical Activity: Not on file  Stress: Not on file  Social Connections: Not on file  Intimate Partner Violence: Not on file      I spent *** minutes of face-to-face and non-face-to-face time with patient.  This included previsit chart review, lab review, study review, order entry, electronic health record documentation, patient education and discussion regarding above diagnoses and treatment plan and answered all the questions to patient satisfaction  Frann Rider, [facility]  [facility] [address] [place], [place] [address]  Phone [phone] Fax [phone] Note: This document was prepared with digital dictation and possible smart phrase technology. Any transcriptional errors that result from this process are unintentional.

## 2022-07-26 ENCOUNTER — Encounter: Payer: Self-pay | Admitting: Adult Health

## 2022-07-26 ENCOUNTER — Ambulatory Visit: Payer: BC Managed Care – PPO | Admitting: Adult Health

## 2022-07-29 ENCOUNTER — Ambulatory Visit: Payer: BC Managed Care – PPO | Admitting: Internal Medicine

## 2022-07-30 ENCOUNTER — Encounter: Payer: Self-pay | Admitting: Internal Medicine

## 2022-07-30 ENCOUNTER — Ambulatory Visit (INDEPENDENT_AMBULATORY_CARE_PROVIDER_SITE_OTHER): Payer: BC Managed Care – PPO | Admitting: Internal Medicine

## 2022-07-30 VITALS — BP 136/81 | HR 88 | Ht 69.0 in | Wt 222.2 lb

## 2022-07-30 DIAGNOSIS — G2581 Restless legs syndrome: Secondary | ICD-10-CM

## 2022-07-30 DIAGNOSIS — M25522 Pain in left elbow: Secondary | ICD-10-CM

## 2022-07-30 DIAGNOSIS — M7712 Lateral epicondylitis, left elbow: Secondary | ICD-10-CM

## 2022-07-30 DIAGNOSIS — R195 Other fecal abnormalities: Secondary | ICD-10-CM

## 2022-07-30 DIAGNOSIS — E669 Obesity, unspecified: Secondary | ICD-10-CM | POA: Diagnosis not present

## 2022-07-30 NOTE — Patient Instructions (Signed)
It was a pleasure to see you today.  Thank you for giving [place] the opportunity to be involved in your care.  Below is a brief recap of your visit and next steps.  We will plan to see you again in 1 month.  Summary NO medication changes today I have placed a referral to orthopedic surgery We will follow up in 1 month to discuss RLS.

## 2022-07-30 NOTE — Progress Notes (Unsigned)
Established Patient Office Visit  Subjective   Patient ID: [name] [name], female    DOB: Jun 13, 1956  Age: 66 y.o. MRN: [id number]  Chief Complaint  Patient presents with   Medication Dose Change    Weight medication, RLS, and elbow    Ms. [name] [name] returns to care today.  She was last seen by me on 10/23 at which time she endorsed left lateral elbow pain that seemed most consistent with left lateral epicondylitis.  She elected to attempt home physical therapy until follow-up.  She also expressed frustration over poorly controlled RLS symptoms.  A 4-week follow-up was arranged to review her symptoms.  There have been no acute interval events. Today Ms. [name] [name] reports that her left lateral elbow pain has not improved.  She is interested in a referral to orthopedic surgery for steroid injection.  She reports today that she does not want to continue seeing her neurologist for treatment of restless leg syndrome and would prefer that I manage her symptoms.  She has not been able to fill Wegovy due to backorder.  Ms. [name] [name] completed a Cologuard test recently, which was positive.  She was referred to gastroenterology and has an appointment on 1/8.  Past Medical History:  Diagnosis Date   Allergy Mobic   Arthritis    COPD (chronic obstructive pulmonary disease) (HCC)    Emphysema of lung ([place])    GERD (gastroesophageal reflux disease)    Hard of hearing    Hyperlipidemia 04/13/2022   OCD (obsessive compulsive disorder)    Restless legs syndrome (RLS)    Sleep apnea    Past Surgical History:  Procedure Laterality Date   ABDOMINAL HYSTERECTOMY     BIOPSY  04/22/2022   Procedure: BIOPSY;  Surgeon: Harvel Quale, MD;  [facility];  Service: Gastroenterology;;   [employer]   ESOPHAGOGASTRODUODENOSCOPY (EGD) WITH PROPOFOL N/A 04/22/2022   Procedure: ESOPHAGOGASTRODUODENOSCOPY (EGD) WITH PROPOFOL;  Surgeon: Harvel Quale, MD;  Location: [facility];   Service: Gastroenterology;  Laterality: N/A;  205 ASA 2   FRACTURE SURGERY     foot   JOINT REPLACEMENT     left shoulder   JOINT REPLACEMENT     right shoulder   Social History   Tobacco Use   Smoking status: Former    Packs/day: 1.00    Years: 15.00    Total pack years: 15.00    Types: Cigarettes, E-cigarettes    Quit date: 03/23/2012    Years since quitting: 10.3    Passive exposure: Never   Smokeless tobacco: Never  Substance Use Topics   Alcohol use: Yes    Comment: It's not a weekly occurance   Drug use: Yes    Types: Marijuana   Family History  Problem Relation Age of Onset   Alzheimer's disease Mother    Arthritis Mother    Cancer Mother    Heart attack Father    Heart disease Father    Diabetes Paternal Grandmother    Diabetes Brother    Allergies  Allergen Reactions   Mobic [Meloxicam] Anaphylaxis   Buspirone Anxiety   Review of Systems  Musculoskeletal:  Positive for joint pain (left lateral elbow).  Neurological:        RLS  All other systems reviewed and are negative.    Objective:     BP 136/81   Pulse 88   Ht _0  (1.753 m)   Wt 222 lb 3.2 oz (100.8 kg)  SpO2 95%   BMI 32.81 kg/m  BP Readings from Last 3 Encounters:  07/30/22 136/81  06/21/22 136/82  06/14/22 (!) 168/71   Physical Exam Constitutional:      General: She is not in acute distress.    Appearance: Normal appearance. She is obese. She is not toxic-appearing.  HENT:     Head: Normocephalic and atraumatic.     Right Ear: External ear normal.     Left Ear: External ear normal.     Nose: Nose normal. No congestion or rhinorrhea.     Mouth/Throat:     Mouth: Mucous membranes are moist.     Pharynx: Oropharynx is clear. No oropharyngeal exudate or posterior oropharyngeal erythema.  Eyes:     General: No scleral icterus.    Extraocular Movements: Extraocular movements intact.     Conjunctiva/sclera: Conjunctivae normal.     Pupils: Pupils are equal, round, and reactive  to light.  Cardiovascular:     Rate and Rhythm: Normal rate and regular rhythm.     Pulses: Normal pulses.     Heart sounds: Normal heart sounds. No murmur heard.    No friction rub. No gallop.  Pulmonary:     Effort: Pulmonary effort is normal.     Breath sounds: Normal breath sounds. No wheezing, rhonchi or rales.  Abdominal:     General: Abdomen is flat. Bowel sounds are normal.     Palpations: Abdomen is soft.  Musculoskeletal:        General: Tenderness (Tenderness palpation over the left lateral epicondyle) present. No swelling.     Cervical back: Normal range of motion.     Right lower leg: No edema.     Left lower leg: No edema.  Lymphadenopathy:     Cervical: No cervical adenopathy.  Skin:    General: Skin is warm and dry.     Capillary Refill: Capillary refill takes less than 2 seconds.     Coloration: Skin is not jaundiced.  Neurological:     General: No focal deficit present.     Mental Status: She is alert and oriented to person, place, and time.  Psychiatric:        Mood and Affect: Mood normal.        Behavior: Behavior normal.    Last CBC Lab Results  Component Value Date   WBC 6.1 04/13/2022   HGB 12.3 04/13/2022   HCT 38.7 04/13/2022   MCV 91 04/13/2022   MCH 28.9 04/13/2022   RDW 13.2 04/13/2022   PLT 192 73/56/7014   Last metabolic panel Lab Results  Component Value Date   GLUCOSE 90 04/20/2022   NA 144 04/20/2022   K 4.7 04/20/2022   CL 104 04/20/2022   CO2 24 04/20/2022   BUN 11 04/20/2022   CREATININE 0.79 04/20/2022   EGFR 83 04/20/2022   CALCIUM 9.1 04/20/2022   PROT 6.9 04/13/2022   ALBUMIN 4.2 04/13/2022   LABGLOB 2.7 04/13/2022   AGRATIO 1.6 04/13/2022   BILITOT 0.4 04/13/2022   ALKPHOS 105 04/13/2022   AST 11 04/13/2022   ALT 8 04/13/2022   Last lipids Lab Results  Component Value Date   CHOL 158 04/13/2022   HDL 44 04/13/2022   LDLCALC 90 04/13/2022   TRIG 135 04/13/2022   CHOLHDL 3.6 04/13/2022   Last hemoglobin  A1c Lab Results  Component Value Date   HGBA1C 5.7 (H) 04/13/2022   Last thyroid functions Lab Results  Component Value Date  TSH 2.860 04/13/2022   Last vitamin D Lab Results  Component Value Date   VD25OH 38.4 04/13/2022   Last vitamin B12 and Folate Lab Results  Component Value Date   SELTRVUY23 343 04/13/2022   FOLATE 2.4 (L) 04/13/2022    The 10-year ASCVD risk score (Arnett DK, et al., 2019) is: 5.5%    Assessment & Plan:   Problem List Items Addressed This Visit       Obesity (BMI 30-39.9)    Previously prescribed Wegovy but has not been able to fill the prescription due to backorder.  I have recommended that she focus on lifestyle modifications aimed at weight loss until she is able to fill [facility].  She is in agreement with this plan.      Restless leg syndrome    Her acute concern today is poorly controlled RLS symptoms.  This was previously managed by her neurologist, however she does not wish to continue following neurology.  Iron studies were updated in late October and were consistent with iron deficiency.  It was recommended that she start daily iron supplementation for the next 3 months.  Low-dose clonazepam 0.25 mg at bedtime was prescribed.  Her symptoms have not improved with these measures. -No medication changes today.  She would like to continue current treatment measures for now.  Will plan for follow-up in 1 month to further address RLS.      Left elbow pain    Most consistent with left lateral epicondylitis.  Her symptoms have not improved with conservative measures.  I have placed a referral to orthopedic surgery for evaluation.      Positive colorectal cancer screening using Cologuard test    Recent Cologuard test was positive.  She has been referred to GI for screening colonoscopy and has an appointment scheduled for 1/8.      Return in about 1 month (around 08/30/2022) for RLS.    Johnette Abraham, MD

## 2022-08-05 DIAGNOSIS — R195 Other fecal abnormalities: Secondary | ICD-10-CM | POA: Insufficient documentation

## 2022-08-05 NOTE — Assessment & Plan Note (Signed)
Previously prescribed Wegovy but has not been able to fill the prescription due to backorder.  I have recommended that she focus on lifestyle modifications aimed at weight loss until she is able to fill [facility].  She is in agreement with this plan.

## 2022-08-05 NOTE — Assessment & Plan Note (Signed)
Most consistent with left lateral epicondylitis.  Her symptoms have not improved with conservative measures.  I have placed a referral to orthopedic surgery for evaluation.

## 2022-08-05 NOTE — Assessment & Plan Note (Signed)
Recent Cologuard test was positive.  She has been referred to GI for screening colonoscopy and has an appointment scheduled for 1/8.

## 2022-08-05 NOTE — Assessment & Plan Note (Signed)
Her acute concern today is poorly controlled RLS symptoms.  This was previously managed by her neurologist, however she does not wish to continue following neurology.  Iron studies were updated in late October and were consistent with iron deficiency.  It was recommended that she start daily iron supplementation for the next 3 months.  Low-dose clonazepam 0.25 mg at bedtime was prescribed.  Her symptoms have not improved with these measures. -No medication changes today.  She would like to continue current treatment measures for now.  Will plan for follow-up in 1 month to further address RLS.

## 2022-08-27 ENCOUNTER — Ambulatory Visit (INDEPENDENT_AMBULATORY_CARE_PROVIDER_SITE_OTHER): Payer: BC Managed Care – PPO

## 2022-08-27 ENCOUNTER — Encounter: Payer: Self-pay | Admitting: Orthopedic Surgery

## 2022-08-27 ENCOUNTER — Ambulatory Visit (INDEPENDENT_AMBULATORY_CARE_PROVIDER_SITE_OTHER): Payer: BC Managed Care – PPO | Admitting: Orthopedic Surgery

## 2022-08-27 VITALS — BP 117/65 | HR 87 | Ht 69.0 in | Wt 221.0 lb

## 2022-08-27 DIAGNOSIS — M7712 Lateral epicondylitis, left elbow: Secondary | ICD-10-CM | POA: Diagnosis not present

## 2022-08-27 DIAGNOSIS — M25522 Pain in left elbow: Secondary | ICD-10-CM | POA: Diagnosis not present

## 2022-08-27 MED ORDER — METHYLPREDNISOLONE ACETATE 40 MG/ML IJ SUSP
40.0000 mg | Freq: Once | INTRAMUSCULAR | Status: AC
  Administered 2022-08-27: 40 mg via INTRA_ARTICULAR

## 2022-08-27 NOTE — Addendum Note (Signed)
Addended by: Elizabeth Sauer on: 08/27/2022 12:16 PM   Modules accepted: Orders

## 2022-08-27 NOTE — Progress Notes (Addendum)
GI Office Note    Referring Provider: Johnette Abraham, MD Primary Care Physician:  Johnette Abraham, MD Primary Gastroenterologist: Harvel Quale, MD  Date:  08/30/2022  ID:  [name] [name], DOB May 23, 1956, MRN [id number]   Chief Complaint   Chief Complaint  Patient presents with   Colonoscopy    Colonoscopy screening. Positive cologuard     History of Present Illness  [name] [name] is a 67 y.o. female with a history of COPD, OCD, RLS, sleep apnea, GERD, HLD presenting today for follow up of RUQ colicky abdominal pain.  Last colonoscopy at age 51-: reported as normal.  RUQ U/s July 2023 with no acute abnormality, likely hepatic steatosis.  CT A//P July 2023: no acute abnormalities  Labs July 2023: WBC 15.9, lipase 185, K 3.4, Cr 1.12, LFTs wnl.  Seen by [facility] general surgery who felt symptoms were gastroenteritis vs biliary related.   Last office visit 03/16/22. ED visit mid July for RUQ pain, nausea, vomiting, diarrhea. 2 syncopal episode while on toilet. Sudden RUQ pain intermittently since July 4th. Intermittent nausea with occasional vomiting. Pain radiating to upper back. Intermittent diarrhea alternating with formed stools. No melena or brbpr.No NSAIDs. Occasional alcohol use. HIDA scan ordered. GB eating plan advised.   Cdiff and fecal WBC negative in July.  HIDA 03/19/22: -normal exam, Gallbladder EF 67% -EGD advised to further evaluate symptoms.   EGD 04/22/22: -2 tongues of salmon colored mucosa (1 cm long) s/p biopsy -2 cm hiatal hernia -normal stomach s/p biopsy (reactive gastropathy, minimal chronic gastritis. Negative H. pylori -duodenitis  -GE junction biopsy with reactive mucosa and chronic reactive changes -Omeprazole 40 mg daily recommended -If normal biopsies consider f/u with general surgery.  H. Pylori IgG negative 04/22/22.  Positive cologuard 06/23/22.   Today: GERD: Still has some RUQ pain. Denies epigastric pain. No reflux or  heartburn. No dysphagia. No nausea. Only taking carafate if she has several days of pain.  RUQ pain: Still has some RUQ pain at times and has a stabbing pain about every 2 weeks, sometimes weekly. Always tender in that area. Reports during the HIDA scan she had been in soo much pain and walked around holding her side. Was told she was not going to get the CCK administration.   Bowel habits: Denies any changes.  Never has normal BMs. Has looser bowel movements and at times vomits when she has a bowel movement. After she vomits she feels empty but has been doing that since the 80's. Does not occur with every bowel movement. Occurs about once per month.  Denies melena or brbpr. No family history of colon cancer or colon polyps.   Denies any unintentional weight loss, lack of appetite, early satiety, chest pain, shortness of breath, lower extremity edema.  Does have history of restless leg.  Denies melena or brbpr. No family history of colon cancer or colon polyps.  Just started wegovy. Has not had any side effects.   Current Outpatient Medications  Medication Sig Dispense Refill   albuterol (VENTOLIN HFA) 108 (90 Base) MCG/ACT inhaler INHALE 2 PUFFS INTO THE LUNGS EVERY 6 HOURS AS NEEDED FOR WHEEZING OR SHORTNESS OF BREATH 6.7 g 2   citalopram (CELEXA) 40 MG tablet Take 40 mg by mouth daily.     clonazePAM (KLONOPIN) 0.25 MG disintegrating tablet Take 1 tablet (0.25 mg total) by mouth at bedtime. 60 tablet 0   cyanocobalamin (VITAMIN B12) 1000 MCG/ML injection 1ML ONCE MONTHLY. 22 mL 0  Fluticasone-Umeclidin-Vilant (TRELEGY ELLIPTA) 200-62.5-25 MCG/ACT AEPB Inhale 1 puff into the lungs daily. 60 each 5   gabapentin (NEURONTIN) 600 MG tablet One tab tid +2 extra tabs qhs. (Patient taking differently: Take 600 mg by mouth in the morning, at noon, in the evening, and at bedtime.) 450 tablet 3   NEEDLE, DISP, 23 G (BD DISP NEEDLE) 23G X 1" MISC Needle used for B12 IM injections. 22 each 0   omeprazole  (PRILOSEC) 40 MG capsule Take 1 capsule (40 mg total) by mouth daily. 90 capsule 3   rOPINIRole (REQUIP) 4 MG tablet Take 1 tablet (4 mg total) by mouth at bedtime. 90 tablet 3   rosuvastatin (CRESTOR) 5 MG tablet Take 1 tablet (5 mg total) by mouth daily. 90 tablet 3   SYRINGE-NEEDLE, DISP, 3 ML (B-D 3CC LUER-LOK SYR 18GX1-1/2) 18G X 1-1/2" 3 ML MISC Syringe/needle to draw up B12 injections. 22 each 0   WEGOVY 0.25 MG/0.5ML SOAJ SMARTSIG:0.25 Milligram(s) SUB-Q Once a Week     polyethylene glycol-electrolytes (NULYTELY) 420 g solution Take 4,000 mLs by mouth once for 1 dose. 4000 mL 0   No current facility-administered medications for this visit.    Past Medical History:  Diagnosis Date   Allergy Mobic   Arthritis    COPD (chronic obstructive pulmonary disease) (HCC)    Emphysema of lung ([place])    GERD (gastroesophageal reflux disease)    Hard of hearing    Hyperlipidemia 04/13/2022   OCD (obsessive compulsive disorder)    Restless legs syndrome (RLS)    Sleep apnea     Past Surgical History:  Procedure Laterality Date   ABDOMINAL HYSTERECTOMY     BIOPSY  04/22/2022   Procedure: BIOPSY;  Surgeon: Harvel Quale, MD;  [facility];  Service: Gastroenterology;;   [place]   ESOPHAGOGASTRODUODENOSCOPY (EGD) WITH PROPOFOL N/A 04/22/2022   Procedure: ESOPHAGOGASTRODUODENOSCOPY (EGD) WITH PROPOFOL;  Surgeon: Harvel Quale, MD;  [facility];  Service: Gastroenterology;  Laterality: N/A;  205 ASA 2   FRACTURE SURGERY     foot   JOINT REPLACEMENT     left shoulder   JOINT REPLACEMENT     right shoulder    Family History  Problem Relation Age of Onset   Alzheimer's disease Mother    Arthritis Mother    Cancer Mother    Heart attack Father    Heart disease Father    Diabetes Brother    Cancer Brother        possibly kidney   Diabetes Paternal Grandmother    Colon cancer Neg Hx    Colon polyps Neg Hx     Allergies as  of 08/30/2022 - Review Complete 08/30/2022  Allergen Reaction Noted   Mobic [meloxicam] Anaphylaxis 11/27/2020   Buspirone Anxiety 08/08/2020    Social History   Socioeconomic History   Marital status: Single    Spouse name: Not on file   Number of children: 2   Years of education: college   Highest education level: Not on file  Occupational History   Occupation: Press photographer Rep  Tobacco Use   Smoking status: Former    Packs/day: 1.00    Years: 15.00    Total pack years: 15.00    Types: Cigarettes, E-cigarettes    Quit date: 03/23/2012    Years since quitting: 10.4    Passive exposure: Never   Smokeless tobacco: Never  Substance and Sexual Activity   Alcohol use: Yes  Comment: It's not a weekly occurance   Drug use: Yes    Types: Marijuana   Sexual activity: Not on file  Other Topics Concern   Not on file  Social History Narrative   Lives alone.   Caffeine use: occasional use   Right-handed.   Social Determinants of Health   Financial Resource Strain: Not on file  Food Insecurity: Not on file  Transportation Needs: Not on file  Physical Activity: Not on file  Stress: Not on file  Social Connections: Not on file     Review of Systems   Gen: Denies fever, chills, anorexia. Denies fatigue, weakness, weight loss.  CV: Denies chest pain, palpitations, syncope, peripheral edema, and claudication. Resp: Denies dyspnea at rest, cough, wheezing, coughing up blood, and pleurisy. GI: See HPI Derm: Denies rash, itching, dry skin Psych: Denies depression, anxiety, memory loss, confusion. No homicidal or suicidal ideation.  Heme: Denies bruising, bleeding, and enlarged lymph nodes.   Physical Exam   BP 126/63 (BP Location: Right Arm, Patient Position: Standing, Cuff Size: Large)   Pulse 94   Temp 97.9 F (36.6 C) (Temporal)   Ht '5\' 9"'$  (1.753 m)   Wt 222 lb 9.6 oz (101 kg)   SpO2 96%   BMI 32.87 kg/m   General:   Alert and oriented. No distress noted. Pleasant and  cooperative.  Head:  Normocephalic and atraumatic. Eyes:  Conjuctiva clear without scleral icterus. Mouth:  Oral mucosa pink and moist. Good dentition. No lesions. Lungs:  Clear to auscultation bilaterally. No wheezes, rales, or rhonchi. No distress.  Heart:  S1, S2 present without murmurs appreciated.  Abdomen:  +BS, soft, non-distended.  Mild TTP to right upper quadrant. Negative Murphy sign. No rebound or guarding. No HSM or masses noted. Rectal: deferred Msk:  Symmetrical without gross deformities. Normal posture. Extremities:  Without edema. Neurologic:  Alert and  oriented x4 Psych:  Alert and cooperative. Normal mood and affect.   Assessment  [name] [name] is a 67 y.o. female with a history of COPD, OCD, RLS, sleep apnea, GERD, HLD presenting today for follow up of RUQ colicky abdominal pain.  RUQ pain, Vomiting, diarrhea: Still having colicky right upper quadrant pain with a stabbing sensation about once every 2 weeks or once weekly with constant tenderness.  Also history of chronic looser stools with a vomiting episode while on the commode with a bowel movement that occurs about once per month.  This is chronic.  Imaging in July of last year with CT A/P, right quadrant ultrasound, and HIDA scan as outlined in HPI.  Notably patient did report that CCK administration was not performed during HIDA scan given she is actively having pain at that time. Seen by general surgery at [facility] who felt gastroenteritis was likely cause instead of biliary colic.  She would like to see someone for second opinion.  Will send referral to generated consult to discuss colicky right upper quadrant pain consideration for cholecystectomy.  GERD: Controlled on omeprazole 40 mg daily.  Positive Cologuard: Positive Cologuard 06/23/2022.  No alarm symptoms present at this time.  Has chronic intermittent diarrhea.  Will proceed with TCS for screening for colon cancer with Dr. Jenetta Downer.  PLAN   Proceed with  colonoscopy with propofol by Dr. Jenetta Downer in near future: the risks, benefits, and alternatives have been discussed with the patient in detail. The patient states understanding and desires to proceed. ASA 2 Hold wegovy for at least 7 days.  General surgery consult  to discuss colicky RUQ pain.  GB eating plan Continue omeprazole 40 mg daily. Follow up in 6 months.     Venetia Night, MSN, FNP-BC, AGACNP-BC [facility]  I have reviewed the note and agree with the APP's assessment as described in this progress note  Maylon Peppers, MD Gastroenterology and [facility]

## 2022-08-27 NOTE — Progress Notes (Signed)
New Patient Visit  Assessment: [name] [name] is a 67 y.o. female with the following: 1. Lateral epicondylitis of left elbow   Plan: Draven Laine has exquisite pain and tenderness over the lateral epicondyle.  Physical exam is consistent with tennis elbow.  This is been problematic for the past month or so.  She has tried bracing.  She has tried medications.  She has tried topical treatments.  She is interested in an injection.  This was completed in clinic today.  If she continues to have pain in her right foot, we will work this up fully at the next visit.  Procedure note injection Left level, lateral epicondyle   Verbal consent was obtained to inject the left elbow, lateral epicondyle Timeout was completed to confirm the site of injection.  The skin was prepped with alcohol and ethyl chloride was sprayed at the injection site.  A 21-gauge needle was used to inject 40 mg of Depo-Medrol and 1% lidocaine (3 cc) into the common insertion for the extensor tendons on the left lateral epicondyle using a direct lateral approach.  There were no complications. A sterile bandage was applied.     Follow-up: Return if symptoms worsen or fail to improve.  Subjective:  Chief Complaint  Patient presents with   Elbow Pain    Lt elbow pain for 2 months   Foot Pain    Rt top of foot pain/swelling for 2 wks.    History of Present Illness: [name] [name] is a 67 y.o. female who has been referred by Sharolyn Douglas, MD for evaluation of left elbow pain.  She states she has had pain in the left elbow for couple of months.  No specific injury.  She does fall at times, related to her restless leg syndrome.  She has tried a counterforce brace.  She has tried medications.  She is used topical treatments.  She continues to have pain in the lateral elbow.  This affects her gripping.  She has tried home exercises, without improvement in her symptoms.   Review of Systems: No fevers or chills No  numbness or tingling No chest pain No shortness of breath No bowel or bladder dysfunction No GI distress No headaches   Medical History:  Past Medical History:  Diagnosis Date   Allergy Mobic   Arthritis    COPD (chronic obstructive pulmonary disease) (HCC)    Emphysema of lung (HCC)    GERD (gastroesophageal reflux disease)    Hard of hearing    Hyperlipidemia 04/13/2022   OCD (obsessive compulsive disorder)    Restless legs syndrome (RLS)    Sleep apnea     Past Surgical History:  Procedure Laterality Date   ABDOMINAL HYSTERECTOMY     BIOPSY  04/22/2022   Procedure: BIOPSY;  Surgeon: Harvel Quale, MD;  [facility]: [facility];  Service: Gastroenterology;;   CESAREAN SECTION  1996   ESOPHAGOGASTRODUODENOSCOPY (EGD) WITH PROPOFOL N/A 04/22/2022   Procedure: ESOPHAGOGASTRODUODENOSCOPY (EGD) WITH PROPOFOL;  Surgeon: Harvel Quale, MD;  Location: [facility];  Service: Gastroenterology;  Laterality: N/A;  205 ASA 2   FRACTURE SURGERY     foot   JOINT REPLACEMENT     left shoulder   JOINT REPLACEMENT     right shoulder    Family History  Problem Relation Age of Onset   Alzheimer's disease Mother    Arthritis Mother    Cancer Mother    Heart attack Father    Heart  disease Father    Diabetes Paternal Grandmother    Diabetes Brother    Social History   Tobacco Use   Smoking status: Former    Packs/day: 1.00    Years: 15.00    Total pack years: 15.00    Types: Cigarettes, E-cigarettes    Quit date: 03/23/2012    Years since quitting: 10.4    Passive exposure: Never   Smokeless tobacco: Never  Substance Use Topics   Alcohol use: Yes    Comment: It's not a weekly occurance   Drug use: Yes    Types: Marijuana    Allergies  Allergen Reactions   Mobic [Meloxicam] Anaphylaxis   Buspirone Anxiety    No outpatient medications have been marked as taking for the 08/27/22 encounter (Office Visit) with Mordecai Rasmussen, MD.    Objective: BP  117/65   Pulse 87   Ht '5\' 9"'$  (1.753 m)   Wt 221 lb (100.2 kg)   BMI 32.64 kg/m   Physical Exam:  General: Alert and oriented. and No acute distress. Gait: Normal gait.  Evaluation left elbow demonstrates no swelling.  No bruising is appreciated.  Exquisite tenderness to palpation over the lateral epicondyle.  Pain is recreated with resisted long finger extension.  Pain is recreated with resisted wrist extension.  Fingers are warm and well-perfused.  Sensation is intact throughout the left hand.  IMAGING: I personally ordered and reviewed the following images  X-rays of the left ankle were obtained in clinic today.  No acute injuries are noted.  Joint is reduced.  Well-maintained joint space.  No osteophytes are appreciated.  No bony lesions.  Impression: Negative left elbow x-ray   New Medications:  No orders of the defined types were placed in this encounter.     Mordecai Rasmussen, MD  08/27/2022 11:55 AM

## 2022-08-27 NOTE — Patient Instructions (Signed)

## 2022-08-30 ENCOUNTER — Encounter: Payer: Self-pay | Admitting: *Deleted

## 2022-08-30 ENCOUNTER — Ambulatory Visit (INDEPENDENT_AMBULATORY_CARE_PROVIDER_SITE_OTHER): Payer: BC Managed Care – PPO | Admitting: Gastroenterology

## 2022-08-30 ENCOUNTER — Ambulatory Visit (INDEPENDENT_AMBULATORY_CARE_PROVIDER_SITE_OTHER): Payer: BC Managed Care – PPO | Admitting: Internal Medicine

## 2022-08-30 ENCOUNTER — Encounter: Payer: Self-pay | Admitting: Internal Medicine

## 2022-08-30 ENCOUNTER — Encounter: Payer: Self-pay | Admitting: Gastroenterology

## 2022-08-30 VITALS — BP 125/73 | HR 84 | Ht 69.0 in | Wt 221.6 lb

## 2022-08-30 VITALS — BP 126/63 | HR 94 | Temp 97.9°F | Ht 69.0 in | Wt 222.6 lb

## 2022-08-30 DIAGNOSIS — R112 Nausea with vomiting, unspecified: Secondary | ICD-10-CM

## 2022-08-30 DIAGNOSIS — E611 Iron deficiency: Secondary | ICD-10-CM | POA: Diagnosis not present

## 2022-08-30 DIAGNOSIS — R195 Other fecal abnormalities: Secondary | ICD-10-CM

## 2022-08-30 DIAGNOSIS — R1011 Right upper quadrant pain: Secondary | ICD-10-CM | POA: Diagnosis not present

## 2022-08-30 DIAGNOSIS — E669 Obesity, unspecified: Secondary | ICD-10-CM

## 2022-08-30 DIAGNOSIS — R197 Diarrhea, unspecified: Secondary | ICD-10-CM

## 2022-08-30 DIAGNOSIS — G2581 Restless legs syndrome: Secondary | ICD-10-CM | POA: Diagnosis not present

## 2022-08-30 DIAGNOSIS — K219 Gastro-esophageal reflux disease without esophagitis: Secondary | ICD-10-CM

## 2022-08-30 MED ORDER — TRAMADOL HCL 50 MG PO TABS: 50.0000 mg | ORAL_TABLET | Freq: Every evening | ORAL | 0 refills | Status: DC | PRN

## 2022-08-30 MED ORDER — SEMAGLUTIDE-WEIGHT MANAGEMENT 0.5 MG/0.5ML ~~LOC~~ SOAJ: 0.5000 mg | SUBCUTANEOUS | 0 refills | Status: AC

## 2022-08-30 MED ORDER — PEG 3350-KCL-NA BICARB-NACL 420 G PO SOLR: 4000.0000 mL | Freq: Once | ORAL | 0 refills | Status: AC

## 2022-08-30 NOTE — Assessment & Plan Note (Signed)
Symptoms remain poorly controlled.  She is currently taking ropinirole 4 mg nightly as well as gabapentin 600/600/900 mg 3 times daily.  Clonazepam has not been effective.  She is no longer followed by neurology.  Recent labs have demonstrated iron deficiency.  Oral iron supplementation was recommended, however she is not currently on supplementation. -Start iron infusions for treatment of iron deficiency -Discontinue clonazepam.  We will try tramadol 50 mg nightly. -Her current preference is not to continue following with neurology.  We will see if today's adjustments are beneficial, however if no improvement is made I will refer her to a different neurologist for evaluation.

## 2022-08-30 NOTE — Patient Instructions (Addendum)
We are scheduling you for colonoscopy in the near future with Dr. Jenetta Downer.  I am also sending a referral to general surgery for you to discuss your right upper quadrant pain and consideration for possible removal of your gallbladder.  Just for your education, I am attaching a gallbladder eating plan just in case this is your gallbladder causing your pain.  Continue your omeprazole 40 mg daily.  Follow a GERD diet:  Avoid fried, fatty, greasy, spicy, citrus foods. Avoid caffeine and carbonated beverages. Avoid chocolate. Try eating 4-6 small meals a day rather than 3 large meals. Do not eat within 3 hours of laying down. Prop head of bed up on wood or bricks to create a 6 inch incline.  It was a pleasure to see you today. I want to create trusting relationships with patients. If you receive a survey regarding your visit,  I greatly appreciate you taking time to fill this out on paper or through your MyChart. I value your feedback.  Venetia Night, MSN, FNP-BC, AGACNP-BC [facility]

## 2022-08-30 NOTE — Assessment & Plan Note (Signed)
Noted on recent labs.  Oral iron supplementation has previously been recommended, however she is not taking any supplementation currently.  This is likely contributing to her RLS symptoms. -Iron infusions ordered today

## 2022-08-30 NOTE — Addendum Note (Signed)
Addended by: Madelin Rear on: 08/30/2022 09:45 AM   Modules accepted: Orders

## 2022-08-30 NOTE — Patient Instructions (Signed)
It was a pleasure to see you today.  Thank you for giving [place] the opportunity to be involved in your care.  Below is a brief recap of your visit and next steps.  We will plan to see you again in 3 months.  Summary Start tramadol and iron infusion for RLS Increase Wegovy to 0.5 mg weekly starting 1/26, skip 2/2 dose Follow up in 3 months

## 2022-08-30 NOTE — Assessment & Plan Note (Signed)
Seen by GI earlier today.  She will undergo colonoscopy on 2/9.

## 2022-08-30 NOTE — Assessment & Plan Note (Signed)
BMI 32.7 today.  She has started Wegovy 0.25 mg weekly has completed 2 injections.  She will complete 2 additional 0.25 mg injections and then start 0.5 mg.  She will skip her dose on 2/2 per GI recommendations due to colonoscopy on 2/9.

## 2022-08-30 NOTE — Progress Notes (Signed)
Established Patient Office Visit  Subjective   Patient ID: [name] [name], female    DOB: 08-21-1956  Age: 67 y.o. MRN: [id number]  Chief Complaint  Patient presents with   rls    Follow up   [name] returns to care today.  She was last seen by me on 12/8 at which time she reported persistent left lateral elbow pain as well as poorly controlled symptoms of RLS.  She was referred to orthopedic surgery for steroid injection for treatment of left lateral epicondylitis.  1 month follow-up was arranged.  There have been no acute interval events.  She has been seen by orthopedic surgery and gastroenterology.  She was able to fill a prescription for [facility] and has completed two 0.25 mg injections.  Her acute concern today remains poorly controlled RLS symptoms.  She is unable to sit currently due to symptoms and has to constantly move.  She is currently prescribed ropinirole 4 mg nightly, gabapentin 600/600/900 mg 3 times daily.  She has previously been prescribed clonazepam as well but states that she is not taking this medication due to inefficacy.  She is interested in additional treatment options today.  Past Medical History:  Diagnosis Date   Allergy Mobic   Arthritis    COPD (chronic obstructive pulmonary disease) (HCC)    Emphysema of lung ([place])    GERD (gastroesophageal reflux disease)    Hard of hearing    Hyperlipidemia 04/13/2022   OCD (obsessive compulsive disorder)    Restless legs syndrome (RLS)    Sleep apnea    Past Surgical History:  Procedure Laterality Date   ABDOMINAL HYSTERECTOMY     BIOPSY  04/22/2022   Procedure: BIOPSY;  Surgeon: Harvel Quale, MD;  [facility]: [facility];  Service: Gastroenterology;;   [place]   ESOPHAGOGASTRODUODENOSCOPY (EGD) WITH PROPOFOL N/A 04/22/2022   Procedure: ESOPHAGOGASTRODUODENOSCOPY (EGD) WITH PROPOFOL;  Surgeon: Harvel Quale, MD;  [facility];  Service: Gastroenterology;   Laterality: N/A;  205 ASA 2   FRACTURE SURGERY     foot   JOINT REPLACEMENT     left shoulder   JOINT REPLACEMENT     right shoulder   Social History   Tobacco Use   Smoking status: Former    Packs/day: 1.00    Years: 15.00    Total pack years: 15.00    Types: Cigarettes, E-cigarettes    Quit date: 03/23/2012    Years since quitting: 10.4    Passive exposure: Never   Smokeless tobacco: Never  Substance Use Topics   Alcohol use: Yes    Comment: It's not a weekly occurance   Drug use: Yes    Types: Marijuana   Family History  Problem Relation Age of Onset   Alzheimer's disease Mother    Arthritis Mother    Cancer Mother    Heart attack Father    Heart disease Father    Diabetes Brother    Cancer Brother        possibly kidney   Diabetes Paternal Grandmother    Colon cancer Neg Hx    Colon polyps Neg Hx    Allergies  Allergen Reactions   Mobic [Meloxicam] Anaphylaxis   Buspirone Anxiety   Review of Systems  Neurological:        RLS symptoms  All other systems reviewed and are negative.    Objective:     BP 125/73   Pulse 84   Ht  $'5\' 9"'W$  (1.753 m)   Wt 221 lb 9.6 oz (100.5 kg)   SpO2 95%   BMI 32.72 kg/m  BP Readings from Last 3 Encounters:  08/30/22 125/73  08/30/22 126/63  08/27/22 117/65   Physical Exam Vitals reviewed.  Constitutional:      General: She is not in acute distress.    Appearance: Normal appearance. She is obese. She is not toxic-appearing.     Comments: Standing during exam.  Appears uncomfortable  HENT:     Head: Normocephalic and atraumatic.     Right Ear: External ear normal.     Left Ear: External ear normal.     Nose: Nose normal. No congestion or rhinorrhea.     Mouth/Throat:     Mouth: Mucous membranes are moist.     Pharynx: Oropharynx is clear. No oropharyngeal exudate or posterior oropharyngeal erythema.  Eyes:     General: No scleral icterus.    Extraocular Movements: Extraocular movements intact.      Conjunctiva/sclera: Conjunctivae normal.     Pupils: Pupils are equal, round, and reactive to light.  Cardiovascular:     Rate and Rhythm: Normal rate and regular rhythm.     Pulses: Normal pulses.     Heart sounds: Normal heart sounds. No murmur heard.    No friction rub. No gallop.  Pulmonary:     Effort: Pulmonary effort is normal.     Breath sounds: Normal breath sounds. No wheezing, rhonchi or rales.  Abdominal:     General: Abdomen is flat. Bowel sounds are normal. There is no distension.     Palpations: Abdomen is soft.     Tenderness: There is no abdominal tenderness.  Musculoskeletal:        General: No swelling. Normal range of motion.     Cervical back: Normal range of motion.     Right lower leg: No edema.     Left lower leg: No edema.  Lymphadenopathy:     Cervical: No cervical adenopathy.  Skin:    General: Skin is warm and dry.     Capillary Refill: Capillary refill takes less than 2 seconds.     Coloration: Skin is not jaundiced.  Neurological:     General: No focal deficit present.     Mental Status: She is alert and oriented to person, place, and time.  Psychiatric:        Mood and Affect: Mood normal.        Behavior: Behavior normal.    Last CBC Lab Results  Component Value Date   WBC 6.1 04/13/2022   HGB 12.3 04/13/2022   HCT 38.7 04/13/2022   MCV 91 04/13/2022   MCH 28.9 04/13/2022   RDW 13.2 04/13/2022   PLT 192 54/04/8118   Last metabolic panel Lab Results  Component Value Date   GLUCOSE 90 04/20/2022   NA 144 04/20/2022   K 4.7 04/20/2022   CL 104 04/20/2022   CO2 24 04/20/2022   BUN 11 04/20/2022   CREATININE 0.79 04/20/2022   EGFR 83 04/20/2022   CALCIUM 9.1 04/20/2022   PROT 6.9 04/13/2022   ALBUMIN 4.2 04/13/2022   LABGLOB 2.7 04/13/2022   AGRATIO 1.6 04/13/2022   BILITOT 0.4 04/13/2022   ALKPHOS 105 04/13/2022   AST 11 04/13/2022   ALT 8 04/13/2022   Last lipids Lab Results  Component Value Date   CHOL 158 04/13/2022    HDL 44 04/13/2022   LDLCALC 90 04/13/2022   TRIG  135 04/13/2022   CHOLHDL 3.6 04/13/2022   Last hemoglobin A1c Lab Results  Component Value Date   HGBA1C 5.7 (H) 04/13/2022   Last thyroid functions Lab Results  Component Value Date   TSH 2.860 04/13/2022   Last vitamin D Lab Results  Component Value Date   VD25OH 38.4 04/13/2022   Last vitamin B12 and Folate Lab Results  Component Value Date   YQMVHQIO96 295 04/13/2022   FOLATE 2.4 (L) 04/13/2022   The 10-year ASCVD risk score (Arnett DK, et al., 2019) is: 4.6%    Assessment & Plan:   Problem List Items Addressed This Visit       Obesity (BMI 30-39.9)    BMI 32.7 today.  She has started Wegovy 0.25 mg weekly has completed 2 injections.  She will complete 2 additional 0.25 mg injections and then start 0.5 mg.  She will skip her dose on 2/2 per GI recommendations due to colonoscopy on 2/9.      Restless leg syndrome - Primary    Symptoms remain poorly controlled.  She is currently taking ropinirole 4 mg nightly as well as gabapentin 600/600/900 mg 3 times daily.  Clonazepam has not been effective.  She is no longer followed by neurology.  Recent labs have demonstrated iron deficiency.  Oral iron supplementation was recommended, however she is not currently on supplementation. -Start iron infusions for treatment of iron deficiency -Discontinue clonazepam.  We will try tramadol 50 mg nightly. -Her current preference is not to continue following with neurology.  We will see if today's adjustments are beneficial, however if no improvement is made I will refer her to a different neurologist for evaluation.      Positive colorectal cancer screening using Cologuard test    Seen by GI earlier today.  She will undergo colonoscopy on 2/9.      Iron deficiency    Noted on recent labs.  Oral iron supplementation has previously been recommended, however she is not taking any supplementation currently.  This is likely contributing  to her RLS symptoms. -Iron infusions ordered today       Return in about 3 months (around 11/29/2022).    Johnette Abraham, MD

## 2022-08-31 ENCOUNTER — Ambulatory Visit (INDEPENDENT_AMBULATORY_CARE_PROVIDER_SITE_OTHER): Payer: BC Managed Care – PPO

## 2022-08-31 ENCOUNTER — Other Ambulatory Visit: Payer: Self-pay

## 2022-08-31 ENCOUNTER — Encounter: Payer: Self-pay | Admitting: Orthopedic Surgery

## 2022-08-31 ENCOUNTER — Ambulatory Visit (INDEPENDENT_AMBULATORY_CARE_PROVIDER_SITE_OTHER): Payer: BC Managed Care – PPO | Admitting: Orthopedic Surgery

## 2022-08-31 ENCOUNTER — Ambulatory Visit: Payer: BC Managed Care – PPO | Admitting: Internal Medicine

## 2022-08-31 VITALS — BP 138/68 | HR 76 | Ht 69.0 in | Wt 215.0 lb

## 2022-08-31 DIAGNOSIS — M79671 Pain in right foot: Secondary | ICD-10-CM

## 2022-08-31 NOTE — Progress Notes (Signed)
Orthopaedic Clinic Return  Assessment: [name] [name] is a 67 y.o. female with the following: Right foot pain   Plan: [name] [name] has right foot pain.  She fell on her right foot approximately 2 weeks ago.  No bruising currently.  Mild amount of swelling.  Radiographs are negative.  Likely a bruise, or some irritation to the tendon sheath, especially in line with the second toe.  Discussed medications as needed.  Topical treatments including creams, sprays and patches.  Simple exercises for the foot.  This should resolve, but if she continues to have issues, may consider formal physical therapy.  Follow-up: Return if symptoms worsen or fail to improve.   Subjective:  Chief Complaint  Patient presents with   Foot Pain    Right for a couple weeks no known injury     History of Present Illness: [name] [name] is a 67 y.o. female who returns to clinic for evaluation of right foot.  She has had pain in the anterior part of the ankle, and the dorsum of her foot for the past 2 weeks.  No specific injury.  She does remember falling out of bed, or at least finding herself on the floor around that time.  She states that she has restless leg syndrome, and this may have contributed to her falling out of bed.  She has noticed some swelling.  No bruising.  No prior injuries to her right foot.  She localizes the pain in line with the second toe, extending from the anterior aspect of the ankle.  As needed.  No further treatments yet.  Review of Systems: No fevers or chills No numbness or tingling No chest pain No shortness of breath No bowel or bladder dysfunction No GI distress No headaches   Objective: BP 138/68   Pulse 76   Ht '5\' 9"'$  (1.753 m)   Wt 215 lb (97.5 kg)   BMI 31.75 kg/m   Physical Exam:  Alert and oriented.  No acute distress.  Evaluation of the right foot demonstrates no deformity.  No bruising.  No redness.  Mild swelling is appreciated on the dorsal aspect of the  forefoot.  Mild tenderness palpation within the anterior ankle.  Some tenderness in the dorsal aspect of the MTP to the second toe with plantar palpation.  No pain with inversion or eversion.  Sensation intact over the dorsum of the foot.  IMAGING: I personally ordered and reviewed the following images:  X-rays of the right foot were obtained in clinic today.  No acute injuries are noted.  Neutral overall alignment.  No dislocations.  No evidence of callus formation.  No stress fractures.  Small anterolateral osteophyte off the talus.  No bony lesions.  Impression: Negative right foot x-ray   Mordecai Rasmussen, MD 08/31/2022 12:13 PM

## 2022-09-02 ENCOUNTER — Telehealth: Payer: Self-pay | Admitting: Pharmacy Technician

## 2022-09-02 NOTE — Telephone Encounter (Signed)
Auth Submission: NO AUTH NEEDED Payer: [employer] Medication & CPT/J Code(s) submitted: Ferrlecit (Ferric Gluconate) [id number] Route of submission (phone, fax, portal):  Phone # Fax # Auth type: Buy/Bill Units/visits requested: X3 Reference number:  Approval from: 09/02/22 to 01/01/23

## 2022-09-03 ENCOUNTER — Encounter: Payer: Self-pay | Admitting: *Deleted

## 2022-09-06 ENCOUNTER — Other Ambulatory Visit: Payer: Self-pay

## 2022-09-07 ENCOUNTER — Other Ambulatory Visit: Payer: Self-pay

## 2022-09-07 DIAGNOSIS — E611 Iron deficiency: Secondary | ICD-10-CM

## 2022-09-08 ENCOUNTER — Ambulatory Visit: Payer: BC Managed Care – PPO | Admitting: Internal Medicine

## 2022-09-10 ENCOUNTER — Telehealth: Payer: Self-pay | Admitting: Internal Medicine

## 2022-09-10 DIAGNOSIS — E611 Iron deficiency: Secondary | ICD-10-CM | POA: Diagnosis not present

## 2022-09-10 NOTE — Telephone Encounter (Signed)
M[place] w. [employer] called in on patient behalf  Waiting on missing labs for pt Iron request  Fax # [phone]

## 2022-09-11 LAB — IRON,TIBC AND FERRITIN PANEL
Ferritin: 27 ng/mL (ref 15–150)
Iron Saturation: 16 % (ref 15–55)
Iron: 43 ug/dL (ref 27–139)
Total Iron Binding Capacity: 262 ug/dL (ref 250–450)
UIBC: 219 ug/dL (ref 118–369)

## 2022-09-13 ENCOUNTER — Encounter: Payer: Self-pay | Admitting: *Deleted

## 2022-09-13 ENCOUNTER — Telehealth: Payer: Self-pay | Admitting: *Deleted

## 2022-09-13 NOTE — Telephone Encounter (Signed)
Pt called today to reschedule appt from 10/01/22 until a later day. Pt rescheduled for 10/08/22. New instructions sent via MyChart.

## 2022-09-13 NOTE — Telephone Encounter (Signed)
LVM

## 2022-09-13 NOTE — Telephone Encounter (Signed)
Faxed labs to [facility]

## 2022-09-17 ENCOUNTER — Other Ambulatory Visit: Payer: Self-pay | Admitting: Pulmonary Disease

## 2022-09-20 ENCOUNTER — Encounter (HOSPITAL_COMMUNITY)
Admission: RE | Admit: 2022-09-20 | Discharge: 2022-09-20 | Disposition: A | Discharge status: Home / Self Care | Payer: BC Managed Care – PPO | Source: Ambulatory Visit | Attending: Internal Medicine | Admitting: Internal Medicine

## 2022-09-20 VITALS — BP 118/60 | HR 68 | Temp 98.6°F | Resp 12

## 2022-09-20 DIAGNOSIS — D649 Anemia, unspecified: Secondary | ICD-10-CM

## 2022-09-20 DIAGNOSIS — D509 Iron deficiency anemia, unspecified: Secondary | ICD-10-CM | POA: Diagnosis not present

## 2022-09-20 MED ORDER — SODIUM CHLORIDE 0.9 % IV SOLN
125.0000 mg | Freq: Every day | INTRAVENOUS | Status: DC
  Administered 2022-09-20: 125 mg via INTRAVENOUS
  Filled 2022-09-20: qty 10

## 2022-09-20 MED ORDER — DIPHENHYDRAMINE HCL 25 MG PO CAPS
25.0000 mg | ORAL_CAPSULE | Freq: Once | ORAL | Status: AC
  Administered 2022-09-20: 25 mg via ORAL

## 2022-09-20 MED ORDER — ACETAMINOPHEN 325 MG PO TABS
650.0000 mg | ORAL_TABLET | Freq: Once | ORAL | Status: AC
  Administered 2022-09-20: 650 mg via ORAL

## 2022-09-20 NOTE — Progress Notes (Signed)
Diagnosis: Iron Deficiency Anemia  Provider:   Dr. Marland Kitchen  Procedure: Infusion  IV Type: Peripheral, IV Location: R Antecubital  Ferrlecit (ferric gluconate), Dose: 125 mg  Infusion Start Time: 3672  Infusion Stop Time: 1208  Post Infusion IV Care: Observation period completed and Peripheral IV Discontinued  Discharge: Condition: Good, Destination: Home . AVS provided to patient.   Performed by:  Baxter Hire, RN

## 2022-09-21 ENCOUNTER — Ambulatory Visit (INDEPENDENT_AMBULATORY_CARE_PROVIDER_SITE_OTHER): Payer: BC Managed Care – PPO | Admitting: Surgery

## 2022-09-21 ENCOUNTER — Encounter: Payer: Self-pay | Admitting: Surgery

## 2022-09-21 VITALS — BP 123/66 | HR 77 | Temp 99.3°F | Resp 16 | Ht 69.0 in | Wt 218.0 lb

## 2022-09-21 DIAGNOSIS — R1011 Right upper quadrant pain: Secondary | ICD-10-CM

## 2022-09-21 DIAGNOSIS — K8050 Calculus of bile duct without cholangitis or cholecystitis without obstruction: Secondary | ICD-10-CM

## 2022-09-21 NOTE — Progress Notes (Signed)
[facility] History and Physical  Reason for Referral:*** Referring Physician: ***  Chief Complaint   New Patient (Initial Visit)     [name] [name] is a 67 y.o. female.  HPI: ***.  The *** started *** and has had a duration of ***.  It is associated with ***.  The *** is improved with ***, and is made worse with ***.    Quality*** Context***  Past Medical History:  Diagnosis Date  . Allergy Mobic  . Arthritis   . COPD (chronic obstructive pulmonary disease) ([place])   . Emphysema of lung ([place])   . GERD (gastroesophageal reflux disease)   . Hard of hearing   . Hyperlipidemia 04/13/2022  . OCD (obsessive compulsive disorder)   . Restless legs syndrome (RLS)   . Sleep apnea     Past Surgical History:  Procedure Laterality Date  . ABDOMINAL HYSTERECTOMY    . BIOPSY  04/22/2022   Procedure: BIOPSY;  Surgeon: Harvel Quale, MD;  [facility];  Service: Gastroenterology;;  . [place]  . ESOPHAGOGASTRODUODENOSCOPY (EGD) WITH PROPOFOL N/A 04/22/2022   Procedure: ESOPHAGOGASTRODUODENOSCOPY (EGD) WITH PROPOFOL;  Surgeon: Harvel Quale, MD;  [facility];  Service: Gastroenterology;  Laterality: N/A;  205 ASA 2  . FRACTURE SURGERY     foot  . JOINT REPLACEMENT     left shoulder  . JOINT REPLACEMENT     right shoulder    Family History  Problem Relation Age of Onset  . Alzheimer's disease Mother   . Arthritis Mother   . Cancer Mother   . Heart attack Father   . Heart disease Father   . Diabetes Brother   . Cancer Brother        possibly kidney  . Diabetes Paternal Grandmother   . Colon cancer Neg Hx   . Colon polyps Neg Hx     Social History   Tobacco Use  . Smoking status: Former    Packs/day: 1.00    Years: 15.00    Total pack years: 15.00    Types: Cigarettes, E-cigarettes    Quit date: 03/23/2012    Years since quitting: 10.5    Passive exposure: Never  . Smokeless tobacco: Never   Substance Use Topics  . Alcohol use: Yes    Comment: It's not a weekly occurance  . Drug use: Yes    Types: Marijuana    Medications: {medication reviewed/display:3041432} Allergies as of 09/21/2022       Reactions   Mobic [meloxicam] Anaphylaxis   Buspirone Anxiety        Medication List        Accurate as of September 21, 2022  3:13 PM. If you have any questions, ask your nurse or doctor.          albuterol 108 (90 Base) MCG/ACT inhaler Commonly known as: VENTOLIN HFA INHALE 2 PUFFS INTO THE LUNGS EVERY 6 HOURS AS NEEDED FOR WHEEZING OR SHORTNESS OF BREATH   B-D 3CC LUER-LOK SYR 18GX1-1/2 18G X 1-1/2" 3 ML Misc Generic drug: SYRINGE-NEEDLE (DISP) 3 ML Syringe/needle to draw up B12 injections.   BD Disp Needle 23G X 1" Misc Generic drug: NEEDLE (DISP) 23 G Needle used for B12 IM injections.   citalopram 40 MG tablet Commonly known as: CELEXA Take 40 mg by mouth daily.   cyanocobalamin 1000 MCG/ML injection Commonly known as: VITAMIN B12 1ML ONCE MONTHLY.   gabapentin 600 MG tablet Commonly known as:  Neurontin One tab tid +2 extra tabs qhs. What changed:  how much to take how to take this when to take this additional instructions   omeprazole 40 MG capsule Commonly known as: PRILOSEC Take 1 capsule (40 mg total) by mouth daily.   rOPINIRole 4 MG tablet Commonly known as: REQUIP Take 1 tablet (4 mg total) by mouth at bedtime.   rosuvastatin 5 MG tablet Commonly known as: Crestor Take 1 tablet (5 mg total) by mouth daily.   Semaglutide-Weight Management 0.5 MG/0.5ML Soaj Inject 0.5 mg into the skin once a week for 28 days.   traMADol 50 MG tablet Commonly known as: ULTRAM Take 1 tablet (50 mg total) by mouth at bedtime as needed for up to 30 doses (RLS).   Trelegy Ellipta 200-62.5-25 MCG/ACT Aepb Generic drug: Fluticasone-Umeclidin-Vilant INHALE 1 PUFF INTO THE LUNGS DAILY         ROS:  {Review of Systems:30496}  Blood pressure  123/66, pulse 77, temperature 99.3 F (37.4 C), temperature source Oral, resp. rate 16, height '5\' 9"'$  (1.753 m), weight 218 lb (98.9 kg), SpO2 93 %. Physical Exam  Results: No results found for this or any previous visit (from the past 48 hour(s)).  No results found.   Assessment & Plan:  [name] [name] is a 67 y.o. female with *** -*** -*** -Follow up ***  All questions were answered to the satisfaction of the patient and family***.  The risk and benefits of *** were discussed including but not limited to ***.  After careful consideration, [name] [name] has decided to ***.    [name] [name] A [name] [name] 09/21/2022, 3:13 PM   [name] Freer, DO [facility] [address] [place], [place] [address] [phone] (office)

## 2022-09-22 ENCOUNTER — Encounter: Payer: Self-pay | Admitting: Internal Medicine

## 2022-09-22 NOTE — H&P (Signed)
[facility] History and Physical  Reason for Referral: Right upper quadrant abdominal pain  Referring Physician: Venetia Night, NP  Chief Complaint   New Patient (Initial Visit)     [name] [name] is a 67 y.o. female.  HPI: Patient presents for evaluation of right upper quadrant abdominal pain.  She has been evaluated in the past for gallbladder pathology, and on previous imaging, she was never noted to have gallstones.  She for started having right upper quadrant abdominal pain on 4 July last year.  She underwent a HIDA scan in August, which was normal.  However, after administration of the CCK, she did have similar right upper quadrant abdominal pain to what she was experiencing previously.  She has been having about weekly attacks, with her last attack being yesterday after eating chicken and dumplings.  She does have associated nausea with these attacks, but denies vomiting, fever, or chills.  Her past medical history is significant for COPD with whom she follows with pulmonology, hyperlipidemia, OCD, and restless leg syndrome.  She denies use of home oxygen.  She denies use of blood thinning medications.  Her surgical history significant for a hysterectomy.  She denies use of tobacco products.  She will occasionally drink alcohol, and smokes marijuana 1-2 times a week.  Past Medical History:  Diagnosis Date   Allergy Mobic   Arthritis    COPD (chronic obstructive pulmonary disease) ([facility])    Emphysema of lung ([place])    GERD (gastroesophageal reflux disease)    Hard of hearing    Hyperlipidemia 04/13/2022   OCD (obsessive compulsive disorder)    Restless legs syndrome (RLS)    Sleep apnea     Past Surgical History:  Procedure Laterality Date   ABDOMINAL HYSTERECTOMY     BIOPSY  04/22/2022   Procedure: BIOPSY;  Surgeon: Harvel Quale, MD;  [facility];  Service: Gastroenterology;;   [place]   ESOPHAGOGASTRODUODENOSCOPY (EGD)  WITH PROPOFOL N/A 04/22/2022   Procedure: ESOPHAGOGASTRODUODENOSCOPY (EGD) WITH PROPOFOL;  Surgeon: Harvel Quale, MD;  [facility];  Service: Gastroenterology;  Laterality: N/A;  205 ASA 2   FRACTURE SURGERY     foot   JOINT REPLACEMENT     left shoulder   JOINT REPLACEMENT     right shoulder    Family History  Problem Relation Age of Onset   Alzheimer's disease Mother    Arthritis Mother    Cancer Mother    Heart attack Father    Heart disease Father    Diabetes Brother    Cancer Brother        possibly kidney   Diabetes Paternal Grandmother    Colon cancer Neg Hx    Colon polyps Neg Hx     Social History   Tobacco Use   Smoking status: Former    Packs/day: 1.00    Years: 15.00    Total pack years: 15.00    Types: Cigarettes, E-cigarettes    Quit date: 03/23/2012    Years since quitting: 10.5    Passive exposure: Never   Smokeless tobacco: Never  Substance Use Topics   Alcohol use: Yes    Comment: It's not a weekly occurance   Drug use: Yes    Types: Marijuana    Medications: I have reviewed the patient's current medications. Allergies as of 09/21/2022       Reactions   Mobic [meloxicam] Anaphylaxis   Buspirone Anxiety  Medication List        Accurate as of September 21, 2022  3:13 PM. If you have any questions, ask your nurse or doctor.          albuterol 108 (90 Base) MCG/ACT inhaler Commonly known as: VENTOLIN HFA INHALE 2 PUFFS INTO THE LUNGS EVERY 6 HOURS AS NEEDED FOR WHEEZING OR SHORTNESS OF BREATH   B-D 3CC LUER-LOK SYR 18GX1-1/2 18G X 1-1/2" 3 ML Misc Generic drug: SYRINGE-NEEDLE (DISP) 3 ML Syringe/needle to draw up B12 injections.   BD Disp Needle 23G X 1" Misc Generic drug: NEEDLE (DISP) 23 G Needle used for B12 IM injections.   citalopram 40 MG tablet Commonly known as: CELEXA Take 40 mg by mouth daily.   cyanocobalamin 1000 MCG/ML injection Commonly known as: VITAMIN B12 1ML ONCE MONTHLY.    gabapentin 600 MG tablet Commonly known as: Neurontin One tab tid +2 extra tabs qhs. What changed:  how much to take how to take this when to take this additional instructions   omeprazole 40 MG capsule Commonly known as: PRILOSEC Take 1 capsule (40 mg total) by mouth daily.   rOPINIRole 4 MG tablet Commonly known as: REQUIP Take 1 tablet (4 mg total) by mouth at bedtime.   rosuvastatin 5 MG tablet Commonly known as: Crestor Take 1 tablet (5 mg total) by mouth daily.   Semaglutide-Weight Management 0.5 MG/0.5ML Soaj Inject 0.5 mg into the skin once a week for 28 days.   traMADol 50 MG tablet Commonly known as: ULTRAM Take 1 tablet (50 mg total) by mouth at bedtime as needed for up to 30 doses (RLS).   Trelegy Ellipta 200-62.5-25 MCG/ACT Aepb Generic drug: Fluticasone-Umeclidin-Vilant INHALE 1 PUFF INTO THE LUNGS DAILY         ROS:  Constitutional: negative for chills, fatigue, and fevers Eyes: negative for visual disturbance and pain Ears, nose, mouth, throat, and face: negative for ear drainage, sore throat, and sinus problems Respiratory: negative for cough, wheezing, and shortness of breath Cardiovascular: negative for chest pain and palpitations Gastrointestinal: positive for abdominal pain and nausea, negative for reflux symptoms and vomiting Genitourinary:negative for dysuria and frequency Integument/breast: negative for dryness and rash Hematologic/lymphatic: negative for bleeding and lymphadenopathy Musculoskeletal:negative for back pain and neck pain Neurological: negative for dizziness and tremors Endocrine: negative for temperature intolerance  Blood pressure 123/66, pulse 77, temperature 99.3 F (37.4 C), temperature source Oral, resp. rate 16, height '5\' 9"'$  (1.753 m), weight 218 lb (98.9 kg), SpO2 93 %. Physical Exam Vitals reviewed.  Constitutional:      Appearance: Normal appearance.  HENT:     Head: Normocephalic and atraumatic.  Eyes:      Extraocular Movements: Extraocular movements intact.     Pupils: Pupils are equal, round, and reactive to light.  Cardiovascular:     Rate and Rhythm: Normal rate and regular rhythm.  Pulmonary:     Effort: Pulmonary effort is normal.     Breath sounds: Normal breath sounds.  Abdominal:     Comments: Abdomen soft, nondistended, no percussion tenderness, mild tenderness to palpation right upper quadrant; no rigidity, guarding, rebound tenderness; negative Murphy sign  Musculoskeletal:        General: Normal range of motion.     Cervical back: Normal range of motion.  Skin:    General: Skin is warm and dry.  Neurological:     General: No focal deficit present.     Mental Status: She is alert and oriented to  person, place, and time.  Psychiatric:        Mood and Affect: Mood normal.        Behavior: Behavior normal.     Results: Right upper quadrant ultrasound (03/03/2022): IMPRESSION:  1. No acute abnormality.  2. Mild increased echogenicity of the liver without a discrete  lesion. This likely represents hepatic steatosis.   CT abdomen and pelvis (03/03/2022): IMPRESSION:  No acute intra-abdominal or intrapelvic abnormalities.   Aortic Atherosclerosis (ICD10-I70.0).   HIDA scan (03/19/2022): CLINICAL DATA:  RIGHT upper quadrant pain, nondiagnostic ultrasound, nausea, vomiting, reflux, worsened symptoms with fatty foods   EXAM: NUCLEAR MEDICINE HEPATOBILIARY IMAGING WITH GALLBLADDER EF   TECHNIQUE: Sequential images of the abdomen were obtained out to 60 minutes following intravenous administration of radiopharmaceutical. After slow intravenous infusion of 2.05 micrograms Cholecystokinin, gallbladder ejection fraction was determined.   RADIOPHARMACEUTICALS:  5.5 mCi Tc-16mCholetec IV   COMPARISON:  None Available.   FINDINGS: Normal tracer extraction from bloodstream indicating normal hepatocellular function.   Normal excretion of tracer into biliary tree.    Gallbladder visualized at 10 min.   Small bowel visualized at 33 min.   No hepatic retention of tracer.   Subjectively normal emptying of tracer from gallbladder following CCK administration.   Calculated gallbladder ejection fraction is 67%, normal.   Patient reported no worsening of symptoms following CCK administration.   Normal gallbladder ejection fraction following CCK stimulation is greater than 40% at 1 hour.   IMPRESSION: Normal exam.   Assessment & Plan:  [name] Lovejoyis a 67y.o. female who presents for evaluation of right upper quadrant abdominal pain.  -I explained to the patient that while her imaging is normal for gallbladder pathology, the fact that she had recurrent right upper quadrant abdominal pain with administration of CCK during HIDA, suggest to me that she might have some biliary colic -I explained the reasoning for cholecystectomy, and I further explained to the patient that if her pain is not completely related to gallbladder disease, she could still have recurrent right upper quadrant abdominal pain -I counseled the patient about the indication, risks and benefits of robotic assisted laparoscopic cholecystectomy.  She understands there is a very small chance for bleeding, infection, injury to normal structures (including common bile duct), conversion to open surgery, persistent symptoms, evolution of postcholecystectomy diarrhea, need for secondary interventions, anesthesia reaction, cardiopulmonary issues and other risks not specifically detailed here. I described the expected recovery, the plan for follow-up and the restrictions during the recovery phase.  All questions were answered. -Patient tentatively scheduled for surgery on 2/23 -Information provided to the patient regarding cholecystectomy and low-fat diet  All questions were answered to the satisfaction of the patient.   CGraciella Freer DO [facility] [address] MarvelRPalatine [place] [phone](office)

## 2022-09-27 ENCOUNTER — Encounter (HOSPITAL_COMMUNITY)
Admission: RE | Admit: 2022-09-27 | Discharge: 2022-09-27 | Disposition: A | Discharge status: Home / Self Care | Payer: BC Managed Care – PPO | Source: Ambulatory Visit | Attending: Internal Medicine | Admitting: Internal Medicine

## 2022-09-27 VITALS — BP 120/65 | HR 80 | Temp 98.9°F | Resp 18

## 2022-09-27 DIAGNOSIS — D649 Anemia, unspecified: Secondary | ICD-10-CM | POA: Insufficient documentation

## 2022-09-27 MED ORDER — SODIUM CHLORIDE 0.9 % IV SOLN
125.0000 mg | Freq: Every day | INTRAVENOUS | Status: DC
  Administered 2022-09-27: 125 mg via INTRAVENOUS
  Filled 2022-09-27: qty 10

## 2022-09-27 MED ORDER — ACETAMINOPHEN 325 MG PO TABS
650.0000 mg | ORAL_TABLET | Freq: Once | ORAL | Status: AC
  Administered 2022-09-27: 650 mg via ORAL
  Filled 2022-09-27: qty 2

## 2022-09-27 MED ORDER — DIPHENHYDRAMINE HCL 25 MG PO CAPS
25.0000 mg | ORAL_CAPSULE | Freq: Once | ORAL | Status: AC
  Administered 2022-09-27: 25 mg via ORAL
  Filled 2022-09-27: qty 1

## 2022-09-27 NOTE — Progress Notes (Signed)
Diagnosis: Iron Deficiency Anemia  Provider:  Marland Kitchen MD  Procedure: Infusion  IV Type: Peripheral, IV Location: R Forearm  Ferrlecit (ferric gluconate), Dose: 125 mg  Infusion Start Time: 7782  Infusion Stop Time: 4235  Post Infusion IV Care: Patient declined observation and Peripheral IV Discontinued  Discharge: Condition: Good, Destination: Home . AVS provided to patient.   Performed by:  Baxter Hire, RN

## 2022-10-04 ENCOUNTER — Encounter (HOSPITAL_COMMUNITY)
Admission: RE | Admit: 2022-10-04 | Discharge: 2022-10-04 | Disposition: A | Discharge status: Home / Self Care | Payer: BC Managed Care – PPO | Source: Ambulatory Visit | Attending: Internal Medicine | Admitting: Internal Medicine

## 2022-10-04 VITALS — BP 125/48 | HR 72 | Temp 98.6°F | Resp 16

## 2022-10-04 DIAGNOSIS — D649 Anemia, unspecified: Secondary | ICD-10-CM | POA: Diagnosis not present

## 2022-10-04 MED ORDER — SODIUM CHLORIDE 0.9 % IV SOLN
125.0000 mg | Freq: Every day | INTRAVENOUS | Status: DC
  Administered 2022-10-04: 125 mg via INTRAVENOUS
  Filled 2022-10-04: qty 10

## 2022-10-04 MED ORDER — DIPHENHYDRAMINE HCL 25 MG PO CAPS
25.0000 mg | ORAL_CAPSULE | Freq: Once | ORAL | Status: AC
  Administered 2022-10-04: 25 mg via ORAL

## 2022-10-04 MED ORDER — ACETAMINOPHEN 325 MG PO TABS
650.0000 mg | ORAL_TABLET | Freq: Once | ORAL | Status: AC
  Administered 2022-10-04: 650 mg via ORAL

## 2022-10-04 NOTE — Progress Notes (Signed)
Diagnosis: Iron Deficiency Anemia  Provider:  Marland Kitchen MD  Procedure: Infusion  IV Type: Peripheral, IV Location: R Forearm  Ferrlecit (ferric gluconate), Dose: 125 mg  Infusion Start Time: [id number]  Infusion Stop Time: 1153  Post Infusion IV Care: Patient declined observation and Peripheral IV Discontinued  Discharge: Condition: Stable, Destination: Home. AVS declined.  Performed by:  Binnie Kand, RN

## 2022-10-04 NOTE — Addendum Note (Signed)
Encounter addended by: Baxter Hire, RN on: 10/04/2022 12:17 PM  Actions taken: Therapy plan modified

## 2022-10-08 ENCOUNTER — Encounter (HOSPITAL_COMMUNITY): Payer: Self-pay | Admitting: Gastroenterology

## 2022-10-08 ENCOUNTER — Ambulatory Visit (HOSPITAL_COMMUNITY)
Admission: RE | Admit: 2022-10-08 | Discharge: 2022-10-08 | Disposition: A | Discharge status: Home / Self Care | Payer: BC Managed Care – PPO | Attending: Gastroenterology | Admitting: Gastroenterology

## 2022-10-08 ENCOUNTER — Encounter (HOSPITAL_COMMUNITY)
Admission: RE | Disposition: A | Discharge status: Home / Self Care | Payer: Self-pay | Source: Home / Self Care | Attending: Gastroenterology

## 2022-10-08 ENCOUNTER — Ambulatory Visit (HOSPITAL_COMMUNITY): Payer: BC Managed Care – PPO | Admitting: Certified Registered"

## 2022-10-08 ENCOUNTER — Other Ambulatory Visit: Payer: Self-pay

## 2022-10-08 DIAGNOSIS — G4730 Sleep apnea, unspecified: Secondary | ICD-10-CM | POA: Diagnosis not present

## 2022-10-08 DIAGNOSIS — J449 Chronic obstructive pulmonary disease, unspecified: Secondary | ICD-10-CM | POA: Diagnosis not present

## 2022-10-08 DIAGNOSIS — Z1211 Encounter for screening for malignant neoplasm of colon: Secondary | ICD-10-CM | POA: Diagnosis not present

## 2022-10-08 DIAGNOSIS — K648 Other hemorrhoids: Secondary | ICD-10-CM

## 2022-10-08 DIAGNOSIS — K621 Rectal polyp: Secondary | ICD-10-CM

## 2022-10-08 DIAGNOSIS — Z87891 Personal history of nicotine dependence: Secondary | ICD-10-CM | POA: Diagnosis not present

## 2022-10-08 DIAGNOSIS — D128 Benign neoplasm of rectum: Secondary | ICD-10-CM | POA: Insufficient documentation

## 2022-10-08 DIAGNOSIS — F419 Anxiety disorder, unspecified: Secondary | ICD-10-CM | POA: Insufficient documentation

## 2022-10-08 DIAGNOSIS — J439 Emphysema, unspecified: Secondary | ICD-10-CM | POA: Insufficient documentation

## 2022-10-08 DIAGNOSIS — G2581 Restless legs syndrome: Secondary | ICD-10-CM | POA: Diagnosis not present

## 2022-10-08 DIAGNOSIS — K5520 Angiodysplasia of colon without hemorrhage: Secondary | ICD-10-CM

## 2022-10-08 DIAGNOSIS — E785 Hyperlipidemia, unspecified: Secondary | ICD-10-CM | POA: Insufficient documentation

## 2022-10-08 DIAGNOSIS — R195 Other fecal abnormalities: Secondary | ICD-10-CM

## 2022-10-08 DIAGNOSIS — F429 Obsessive-compulsive disorder, unspecified: Secondary | ICD-10-CM | POA: Diagnosis not present

## 2022-10-08 DIAGNOSIS — K219 Gastro-esophageal reflux disease without esophagitis: Secondary | ICD-10-CM | POA: Insufficient documentation

## 2022-10-08 DIAGNOSIS — Q438 Other specified congenital malformations of intestine: Secondary | ICD-10-CM

## 2022-10-08 HISTORY — PX: COLONOSCOPY WITH PROPOFOL: SHX5780

## 2022-10-08 HISTORY — PX: POLYPECTOMY: SHX5525

## 2022-10-08 LAB — HM COLONOSCOPY

## 2022-10-08 SURGERY — COLONOSCOPY WITH PROPOFOL
Anesthesia: General

## 2022-10-08 MED ORDER — PROPOFOL 10 MG/ML IV BOLUS
INTRAVENOUS | Status: DC | PRN
  Administered 2022-10-08: 100 mg via INTRAVENOUS
  Administered 2022-10-08: 40 mg via INTRAVENOUS
  Administered 2022-10-08 (×3): 50 mg via INTRAVENOUS

## 2022-10-08 MED ORDER — LIDOCAINE HCL (CARDIAC) PF 100 MG/5ML IV SOSY
PREFILLED_SYRINGE | INTRAVENOUS | Status: DC | PRN
  Administered 2022-10-08: 50 mg via INTRAVENOUS

## 2022-10-08 MED ORDER — LACTATED RINGERS IV SOLN: INTRAVENOUS | Status: DC

## 2022-10-08 MED ORDER — PROPOFOL 500 MG/50ML IV EMUL
INTRAVENOUS | Status: DC | PRN
  Administered 2022-10-08: 100 ug/kg/min via INTRAVENOUS

## 2022-10-08 NOTE — Anesthesia Preprocedure Evaluation (Signed)
Anesthesia Evaluation  Patient identified by MRN, date of birth, ID band Patient awake    Reviewed: Allergy & Precautions, H&P , NPO status , Patient's Chart, lab work & pertinent test results, reviewed documented beta blocker date and time   Airway Mallampati: II  TM Distance: >3 FB Neck ROM: full    Dental no notable dental hx.    Pulmonary neg pulmonary ROS, sleep apnea , COPD, former smoker   Pulmonary exam normal breath sounds clear to auscultation       Cardiovascular Exercise Tolerance: Good negative cardio ROS  Rhythm:regular Rate:Normal     Neuro/Psych  PSYCHIATRIC DISORDERS Anxiety     negative neurological ROS  negative psych ROS   GI/Hepatic negative GI ROS, Neg liver ROS,GERD  ,,  Endo/Other  negative endocrine ROS    Renal/GU negative Renal ROS  negative genitourinary   Musculoskeletal   Abdominal   Peds  Hematology negative hematology ROS (+) Blood dyscrasia, anemia   Anesthesia Other Findings   Reproductive/Obstetrics negative OB ROS                             Anesthesia Physical Anesthesia Plan  ASA: 2  Anesthesia Plan: General   Post-op Pain Management:    Induction:   PONV Risk Score and Plan: Propofol infusion  Airway Management Planned:   Additional Equipment:   Intra-op Plan:   Post-operative Plan:   Informed Consent: I have reviewed the patients History and Physical, chart, labs and discussed the procedure including the risks, benefits and alternatives for the proposed anesthesia with the patient or authorized representative who has indicated his/her understanding and acceptance.     Dental Advisory Given  Plan Discussed with: CRNA  Anesthesia Plan Comments:        Anesthesia Quick Evaluation

## 2022-10-08 NOTE — Discharge Instructions (Signed)
You are being discharged to home.  Resume your previous diet.  We are waiting for your pathology results.  Your physician has recommended a repeat colonoscopy for surveillance based on pathology results.  

## 2022-10-08 NOTE — H&P (Signed)
[name] [name] is an 67 y.o. female.   Chief Complaint: positive Cologuard HPI: 67 y.o. female with a history of COPD, OCD, RLS, sleep apnea, GERD, HLD presenting today for  positive Cologuard.  The patient denies having any nausea, vomiting, fever, chills, hematochezia, melena, hematemesis, abdominal distention, abdominal pain, diarrhea, jaundice, pruritus or weight loss. Las colonoscopy 10 years ago, reports it was normal, no reports available. No family history of colorectal cancer.   Past Medical History:  Diagnosis Date   Allergy Mobic   Arthritis    COPD (chronic obstructive pulmonary disease) ([place])    Emphysema of lung ([place])    GERD (gastroesophageal reflux disease)    Hard of hearing    Hyperlipidemia 04/13/2022   OCD (obsessive compulsive disorder)    Restless legs syndrome (RLS)    Screening for colorectal cancer 06/14/2022   Sleep apnea     Past Surgical History:  Procedure Laterality Date   ABDOMINAL HYSTERECTOMY     BIOPSY  04/22/2022   Procedure: BIOPSY;  Surgeon: Harvel Quale, MD;  [facility];  Service: Gastroenterology;;   CESAREAN SECTION  1996   ESOPHAGOGASTRODUODENOSCOPY (EGD) WITH PROPOFOL N/A 04/22/2022   Procedure: ESOPHAGOGASTRODUODENOSCOPY (EGD) WITH PROPOFOL;  Surgeon: Harvel Quale, MD;  [facility];  Service: Gastroenterology;  Laterality: N/A;  205 ASA 2   FRACTURE SURGERY     foot   JOINT REPLACEMENT     left shoulder   JOINT REPLACEMENT     right shoulder    Family History  Problem Relation Age of Onset   Alzheimer's disease Mother    Arthritis Mother    Cancer Mother    Heart attack Father    Heart disease Father    Diabetes Brother    Cancer Brother        possibly kidney   Diabetes Paternal Grandmother    Colon cancer Neg Hx    Colon polyps Neg Hx    Social History:  reports that she quit smoking about 10 years ago. Her smoking use included cigarettes and e-cigarettes. She has a  15.00 pack-year smoking history. She has never been exposed to tobacco smoke. She has never used smokeless tobacco. She reports current alcohol use. She reports current drug use. Drug: Marijuana.  Allergies:  Allergies  Allergen Reactions   Mobic [Meloxicam] Anaphylaxis   Buspirone Anxiety    Medications Prior to Admission  Medication Sig Dispense Refill   acetaminophen (TYLENOL) 500 MG tablet Take 1,000 mg by mouth every 6 (six) hours as needed for moderate pain.     albuterol (VENTOLIN HFA) 108 (90 Base) MCG/ACT inhaler INHALE 2 PUFFS INTO THE LUNGS EVERY 6 HOURS AS NEEDED FOR WHEEZING OR SHORTNESS OF BREATH 6.7 g 2   citalopram (CELEXA) 40 MG tablet Take 40 mg by mouth daily.     cyanocobalamin (VITAMIN B12) 1000 MCG/ML injection 1ML ONCE MONTHLY. 22 mL 0   gabapentin (NEURONTIN) 600 MG tablet One tab tid +2 extra tabs qhs. (Patient taking differently: Take 1,200 mg by mouth 2 (two) times daily.) 450 tablet 3   Magnesium 500 MG TABS Take 500 mg by mouth daily.     NEEDLE, DISP, 23 G (BD DISP NEEDLE) 23G X 1" MISC Needle used for B12 IM injections. 22 each 0   omeprazole (PRILOSEC) 40 MG capsule Take 1 capsule (40 mg total) by mouth daily. 90 capsule 3   rOPINIRole (REQUIP) 4 MG tablet Take 1 tablet (4 mg total)  by mouth at bedtime. (Patient taking differently: Take 4 mg by mouth daily in the afternoon.) 90 tablet 3   rosuvastatin (CRESTOR) 5 MG tablet Take 1 tablet (5 mg total) by mouth daily. 90 tablet 3   TRELEGY ELLIPTA 200-62.5-25 MCG/ACT AEPB INHALE 1 PUFF INTO THE LUNGS DAILY 60 each 5   Semaglutide-Weight Management 0.5 MG/0.5ML SOAJ Inject 0.5 mg into the skin once a week for 28 days. 2 mL 0   SYRINGE-NEEDLE, DISP, 3 ML (B-D 3CC LUER-LOK SYR 18GX1-1/2) 18G X 1-1/2" 3 ML MISC Syringe/needle to draw up B12 injections. 22 each 0   traMADol (ULTRAM) 50 MG tablet Take 1 tablet (50 mg total) by mouth at bedtime as needed for up to 30 doses (RLS). (Patient not taking: Reported on  10/07/2022) 30 tablet 0    No results found for this or any previous visit (from the past 48 hour(s)). No results found.  Review of Systems  All other systems reviewed and are negative.   Blood pressure 126/69, pulse 87, temperature 99.1 F (37.3 C), resp. rate 15, SpO2 96 %. Physical Exam  GENERAL: The patient is AO x3, in no acute distress. HEENT: Head is normocephalic and atraumatic. EOMI are intact. Mouth is well hydrated and without lesions. NECK: Supple. No masses LUNGS: Clear to auscultation. No presence of rhonchi/wheezing/rales. Adequate chest expansion HEART: RRR, normal s1 and s2. ABDOMEN: Soft, nontender, no guarding, no peritoneal signs, and nondistended. BS +. No masses. EXTREMITIES: Without any cyanosis, clubbing, rash, lesions or edema. NEUROLOGIC: AOx3, no focal motor deficit. SKIN: no jaundice, no rashes  Assessment/Plan 67 y.o. female with a history of COPD, OCD, RLS, sleep apnea, GERD, HLD presenting today for  positive Cologuard. We will proceed with colonoscopy.  Harvel Quale, MD 10/08/2022, 7:34 AM

## 2022-10-08 NOTE — Transfer of Care (Signed)
Immediate Anesthesia Transfer of Care Note  Patient: [name] [name]  Procedure(s) Performed: COLONOSCOPY WITH PROPOFOL POLYPECTOMY  Patient Location: Endoscopy Unit  Anesthesia Type:General  Level of Consciousness: awake and oriented  Airway & Oxygen Therapy: Patient Spontanous Breathing  Post-op Assessment: Report given to RN and Post -op Vital signs reviewed and stable  Post vital signs: Reviewed and stable  Last Vitals:  Vitals Value Taken Time  BP    Temp    Pulse    Resp    SpO2      Last Pain:  Vitals:   10/08/22 0718  PainSc: 0-No pain      Patients Stated Pain Goal: 2 ([id number])  Complications: No notable events documented.

## 2022-10-08 NOTE — Op Note (Signed)
[facility] Patient Name: [name] [name] Procedure Date: 10/08/2022 7:55 AM MRN: [id number] Date of Birth: [birth year] Attending MD: Maylon Peppers , , [id number] CSN: [id number] Age: 67 Admit Type: Outpatient Procedure:                Colonoscopy Indications:              Positive Cologuard test Providers:                Maylon Peppers, Lurline Del, RN, Ladoris Gene                            Technician, Technician Referring MD:             Hazle Nordmann. Dixon Medicines:                Monitored Anesthesia Care Complications:            No immediate complications. Estimated Blood Loss:     Estimated blood loss: none. Procedure:                Pre-Anesthesia Assessment:                           - Prior to the procedure, a History and Physical                            was performed, and patient medications, allergies                            and sensitivities were reviewed. The patient's                            tolerance of previous anesthesia was reviewed.                           - The risks and benefits of the procedure and the                            sedation options and risks were discussed with the                            patient. All questions were answered and informed                            consent was obtained.                           - ASA Grade Assessment: II - A patient with mild                            systemic disease.                           After obtaining informed consent, the colonoscope                            was passed under direct vision. Throughout the  procedure, the patient's blood pressure, pulse, and                            oxygen saturations were monitored continuously. The                            [id number] scope was introduced through                            the anus and advanced to the the cecum, identified                            by appendiceal orifice and ileocecal valve. The                             colonoscopy was performed without difficulty. The                            patient tolerated the procedure well. The quality                            of the bowel preparation was adequate. Scope In: 8:07:22 AM Scope Out: 8:49:16 AM Scope Withdrawal Time: 0 hours 26 minutes 27 seconds  Total Procedure Duration: 0 hours 41 minutes 54 seconds  Findings:      The perianal and digital rectal examinations were normal.      Five medium-sized localized angiodysplastic lesions without bleeding       were found in the cecum. Given history of IDA, I decided to coagulate       these. Coagulation for bleeding prevention using argon plasma at 0.3       liters/minute and 20 watts was successful.      The sigmoid colon was tortuous.      A 4 mm polyp was found in the rectum. The polyp was sessile. The polyp       was removed with a cold snare. Resection and retrieval were complete.      Non-bleeding internal hemorrhoids were found during retroflexion. The       hemorrhoids were small. Impression:               - Five non-bleeding colonic angiodysplastic                            lesions. Treated with argon plasma coagulation                            (APC).                           - Tortuous colon.                           - One 4 mm polyp in the rectum, removed with a cold                            snare. Resected and retrieved.                           -  Non-bleeding internal hemorrhoids. Moderate Sedation:      Per Anesthesia Care Recommendation:           - Discharge patient to home (ambulatory).                           - Resume previous diet.                           - Await pathology results.                           - Repeat colonoscopy for surveillance based on                            pathology results. Procedure Code(s):        --- Professional ---                           [phone], 59, Colonoscopy, flexible; with control of                             bleeding, any method                           45385, Colonoscopy, flexible; with removal of                            tumor(s), polyp(s), or other lesion(s) by snare                            technique Diagnosis Code(s):        --- Professional ---                           K55.20, Angiodysplasia of colon without hemorrhage                           D12.8, Benign neoplasm of rectum                           K64.8, Other hemorrhoids                           R19.5, Other fecal abnormalities                           Q43.8, Other specified congenital malformations of                            intestine CPT copyright 2022 [employer]. All rights reserved. The codes documented in this report are preliminary and upon coder review may  be revised to meet current compliance requirements. Maylon Peppers, MD Maylon Peppers,  10/08/2022 8:55:07 AM This report has been signed electronically. Number of Addenda: 0

## 2022-10-09 NOTE — Anesthesia Postprocedure Evaluation (Signed)
Anesthesia Post Note  Patient: [name] [name]  Procedure(s) Performed: COLONOSCOPY WITH PROPOFOL POLYPECTOMY  Patient location during evaluation: Phase II Anesthesia Type: General Level of consciousness: awake Pain management: pain level controlled Vital Signs Assessment: post-procedure vital signs reviewed and stable Respiratory status: spontaneous breathing and respiratory function stable Cardiovascular status: blood pressure returned to baseline and stable Postop Assessment: no headache and no apparent nausea or vomiting Anesthetic complications: no Comments: Late entry   No notable events documented.   Last Vitals:  Vitals:   10/08/22 0853 10/08/22 0857  BP: (!) 82/70 106/72  Pulse: 78 76  Resp: 17 18  Temp: 36.7 C   SpO2: 96% 96%    Last Pain:  Vitals:   10/08/22 0857  TempSrc:   PainSc: 0-No pain                 Louann Sjogren

## 2022-10-11 ENCOUNTER — Other Ambulatory Visit (HOSPITAL_COMMUNITY): Payer: Self-pay

## 2022-10-11 ENCOUNTER — Encounter (INDEPENDENT_AMBULATORY_CARE_PROVIDER_SITE_OTHER): Payer: Self-pay | Admitting: *Deleted

## 2022-10-11 LAB — SURGICAL PATHOLOGY

## 2022-10-12 ENCOUNTER — Encounter (HOSPITAL_COMMUNITY)
Admission: RE | Admit: 2022-10-12 | Discharge: 2022-10-12 | Disposition: A | Discharge status: Home / Self Care | Payer: BC Managed Care – PPO | Source: Ambulatory Visit | Attending: Gastroenterology | Admitting: Gastroenterology

## 2022-10-12 ENCOUNTER — Encounter (HOSPITAL_COMMUNITY): Payer: Self-pay

## 2022-10-12 NOTE — Progress Notes (Signed)
Patient did not stop her semaglutide in time for her procedure on Thursday.  We will need to reschedule.

## 2022-10-12 NOTE — Patient Instructions (Signed)
[name] [name]  10/12/2022     @PREFPERIOPPHARMACY$ @   Your procedure is scheduled on 10/15/22.  Report to [facility] at 8:15 A.M.  Call this number if you have problems the morning of surgery:  [phone]  If you experience any cold or flu symptoms such as cough, fever, chills, shortness of breath, etc. between now and your scheduled surgery, please notify us at the above number.   Remember:    Do not eat or drink after midnight.     Take these medicines the morning of surgery with A SIP OF WATER : Celexa Neurontin omeprazole tramadol    Do not wear jewelry, make-up or nail polish.  Do not wear lotions, powders, or perfumes, or deodorant.  Do not shave 48 hours prior to surgery.  Men may shave face and neck.  Do not bring valuables to the hospital.  [facility] is not responsible for any belongings or valuables.  Contacts, dentures or bridgework may not be worn into surgery.  Leave your suitcase in the car.  After surgery it may be brought to your room.  For patients admitted to the hospital, discharge time will be determined by your treatment team.  Patients discharged the day of surgery will not be allowed to drive home.   Name and phone number of your driver:   Family Special instructions:  N/A  Please read over the following fact sheets that you were given. Care and Recovery After Surgery  Minimally Invasive Cholecystectomy Minimally invasive cholecystectomy is surgery to remove the gallbladder. The gallbladder is a pear-shaped organ that lies beneath the liver on the right side of the body. The gallbladder stores bile, which is a fluid that helps the body digest fats. Cholecystectomy is often done to treat inflammation (irritation and swelling) of the gallbladder (cholecystitis). This condition is usually caused by a buildup of gallstones (cholelithiasis) in the gallbladder or when the fluid in the gall bladder becomes stagnant because gallstones get stuck in the  ducts (tubes) and block the flow of bile. This can result in inflammation and pain. In severe cases, emergency surgery may be required. This procedure is done through small incisions in the abdomen, instead of one large incision. It is also called laparoscopic surgery. A thin scope with a camera (laparoscope) is inserted through one incision. Then surgical instruments are inserted through the other incisions. In some cases, a minimally invasive surgery may need to be changed to a surgery that is done through a larger incision. This is called open surgery. Tell a health care provider about: Any allergies you have. All medicines you are taking, including vitamins, herbs, eye drops, creams, and over-the-counter medicines. Any problems you or family members have had with anesthetic medicines. Any bleeding problems you have. Any surgeries you have had. Any medical conditions you have. Whether you are pregnant or may be pregnant. What are the risks? Generally, this is a safe procedure. However, problems may occur, including: Infection. Bleeding. Allergic reactions to medicines. Damage to nearby structures or organs. A gallstone remaining in the common bile duct. The common bile duct carries bile from the gallbladder to the small intestine. A bile leak from the liver or cystic duct after your gallbladder is removed. What happens before the procedure? When to stop eating and drinking Follow instructions from your health care provider about what you may eat and drink before your procedure. These may include: 8 hours before the procedure Stop eating most foods. Do not eat meat,  fried foods, or fatty foods. Eat only light foods, such as toast or crackers. All liquids are okay except energy drinks and alcohol. 6 hours before the procedure Stop eating. Drink only clear liquids, such as water, clear fruit juice, black coffee, plain tea, and sports drinks. Do not drink energy drinks or alcohol. 2 hours  before the procedure Stop drinking all liquids. You may be allowed to take medicines with small sips of water. If you do not follow your health care provider's instructions, your procedure may be delayed or canceled. Medicines Ask your health care provider about: Changing or stopping your regular medicines. This is especially important if you are taking diabetes medicines or blood thinners. Taking medicines such as aspirin and ibuprofen. These medicines can thin your blood. Do not take these medicines unless your health care provider tells you to take them. Taking over-the-counter medicines, vitamins, herbs, and supplements. General instructions If you will be going home right after the procedure, plan to have a responsible adult: Take you home from the hospital or clinic. You will not be allowed to drive. Care for you for the time you are told. Do not use any products that contain nicotine or tobacco for at least 4 weeks before the procedure. These products include cigarettes, chewing tobacco, and vaping devices, such as e-cigarettes. If you need help quitting, ask your health care provider. Ask your health care provider: How your surgery site will be marked. What steps will be taken to help prevent infection. These may include: Removing hair at the surgery site. Washing skin with a germ-killing soap. Taking antibiotic medicine. What happens during the procedure?  An IV will be inserted into one of your veins. You will be given one or both of the following: A medicine to help you relax (sedative). A medicine to make you fall asleep (general anesthetic). Your surgeon will make several small incisions in your abdomen. The laparoscope will be inserted through one of the small incisions. The camera on the laparoscope will send images to a monitor in the operating room. This lets your surgeon see inside your abdomen. A gas will be pumped into your abdomen. This will expand your abdomen to  give the surgeon more room to perform the surgery. Other tools that are needed for the procedure will be inserted through the other incisions. The gallbladder will be removed through one of the incisions. Your common bile duct may be examined. If stones are found in the common bile duct, they may be removed. After your gallbladder has been removed, the incisions will be closed with stitches (sutures), staples, or skin glue. Your incisions will be covered with a bandage (dressing). The procedure may vary among health care providers and hospitals. What happens after the procedure? Your blood pressure, heart rate, breathing rate, and blood oxygen level will be monitored until you leave the hospital or clinic. You will be given medicines as needed to control your pain. You may have a drain placed in the incision. The drain will be removed a day or two after the procedure. Summary Minimally invasive cholecystectomy, also called laparoscopic cholecystectomy, is surgery to remove the gallbladder using small incisions. Tell your health care provider about all the medical conditions you have and all the medicines you are taking for those conditions. Before the procedure, follow instructions about when to stop eating and drinking and changing or stopping medicines. Plan to have a responsible adult care for you for the time you are told after you leave the  hospital or clinic. This information is not intended to replace advice given to you by your health care provider. Make sure you discuss any questions you have with your health care provider. Document Revised: 02/10/2021 Document Reviewed: 02/10/2021 Elsevier Patient Education  [place] Anesthesia, Adult General anesthesia is the use of medicine to make you fall asleep (unconscious) for a medical procedure. General anesthesia must be used for certain procedures. It is often recommended for surgery or procedures that: Last a long  time. Require you to be still or in an unusual position. Are major and can cause blood loss. Affect your breathing. The medicines used for general anesthesia are called general anesthetics. During general anesthesia, these medicines are given along with medicines that: Prevent pain. Control your blood pressure. Relax your muscles. Prevent nausea and vomiting after the procedure. Tell a health care provider about: Any allergies you have. All medicines you are taking, including vitamins, herbs, eye drops, creams, and over-the-counter medicines. Your history of any: Medical conditions you have, including: High blood pressure. Bleeding problems. Diabetes. Heart or lung conditions, such as: Heart failure. Sleep apnea. Asthma. Chronic obstructive pulmonary disease (COPD). Current or recent illnesses, such as: Upper respiratory, chest, or ear infections. Cough or fever. Tobacco or drug use, including marijuana or alcohol use. Depression or anxiety. Surgeries and types of anesthetics you have had. Problems you or family members have had with anesthetic medicines. Whether you are pregnant or may be pregnant. Whether you have any chipped or loose teeth, dentures, caps, bridgework, or issues with your mouth, swallowing, or choking. What are the risks? Your health care provider will talk with you about risks. These may include: Allergic reaction to the medicines. Lung and heart problems. Inhaling food or liquid from the stomach into the lungs (aspiration). Nerve injury. Injury to the lips, mouth, teeth, or gums. Stroke. Waking up during your procedure and being unable to move. This is rare. These problems are more likely to develop if you are having a major surgery or if you have an advanced or serious medical condition. You can prevent some of these complications by answering all of your health care provider's questions thoroughly and by following all instructions before your  procedure. General anesthesia can cause side effects, including: Nausea or vomiting. A sore throat or hoarseness from the breathing tube. Wheezing or coughing. Shaking chills or feeling cold. Body aches. Sleepiness. Confusion, agitation (delirium), or anxiety. What happens before the procedure? When to stop eating and drinking Follow instructions from your health care provider about what you may eat and drink before your procedure. If you do not follow your health care provider's instructions, your procedure may be delayed or canceled. Medicines Ask your health care provider about: Changing or stopping your regular medicines. These include any diabetes medicines or blood thinners you take. Taking medicines such as aspirin and ibuprofen. These medicines can thin your blood. Do not take them unless your health care provider tells you to. Taking over-the-counter medicines, vitamins, herbs, and supplements. General instructions Do not use any products that contain nicotine or tobacco for at least 4 weeks before the procedure. These products include cigarettes, chewing tobacco, and vaping devices, such as e-cigarettes. If you need help quitting, ask your health care provider. If you brush your teeth on the morning of the procedure, make sure to spit out all of the water and toothpaste. If told by your health care provider, bring your sleep apnea device with you to surgery (if applicable).  If you will be going home right after the procedure, plan to have a responsible adult: Take you home from the hospital or clinic. You will not be allowed to drive. Care for you for the time you are told. What happens during the procedure?  An IV will be inserted into one of your veins. You will be given one or more of the following through a face mask or IV: A sedative. This helps you relax. Anesthesia. This will: Numb certain areas of your body. Make you fall asleep for surgery. After you are  unconscious, a breathing tube may be inserted down your throat to help you breathe. This will be removed before you wake up. An anesthesia provider, such as an anesthesiologist, will stay with you throughout your procedure. The anesthesia provider will: Keep you comfortable and safe by continuing to give you medicines and adjusting the amount of medicine that you get. Monitor your blood pressure, heart rate, and oxygen levels to make sure that the anesthetics do not cause any problems. The procedure may vary among health care providers and hospitals. What happens after the procedure? Your blood pressure, temperature, heart rate, breathing rate, and blood oxygen level will be monitored until you leave the hospital or clinic. You will wake up in a recovery area. You may wake up slowly. You may be given medicine to help you with pain, nausea, or any other side effects from the anesthesia. Summary General anesthesia is the use of medicine to make you fall asleep (unconscious) for a medical procedure. Follow your health care provider's instructions about when to stop eating, drinking, or taking certain medicines before your procedure. Plan to have a responsible adult take you home from the hospital or clinic. This information is not intended to replace advice given to you by your health care provider. Make sure you discuss any questions you have with your health care provider. Document Revised: 11/05/2021 Document Reviewed: 11/05/2021 Elsevier Patient Education  [place].   How to Use Chlorhexidine Before Surgery Chlorhexidine gluconate (CHG) is a germ-killing (antiseptic) solution that is used to clean the skin. It can get rid of the bacteria that normally live on the skin and can keep them away for about 24 hours. To clean your skin with CHG, you may be given: A CHG solution to use in the shower or as part of a sponge bath. A prepackaged cloth that contains CHG. Cleaning your skin with  CHG may help lower the risk for infection: While you are staying in the intensive care unit of the hospital. If you have a vascular access, such as a central line, to provide short-term or long-term access to your veins. If you have a catheter to drain urine from your bladder. If you are on a ventilator. A ventilator is a machine that helps you breathe by moving air in and out of your lungs. After surgery. What are the risks? Risks of using CHG include: A skin reaction. Hearing loss, if CHG gets in your ears and you have a perforated eardrum. Eye injury, if CHG gets in your eyes and is not rinsed out. The CHG product catching fire. Make sure that you avoid smoking and flames after applying CHG to your skin. Do not use CHG: If you have a chlorhexidine allergy or have previously reacted to chlorhexidine. On babies younger than 8 months of age. How to use CHG solution Use CHG only as told by your health care provider, and follow the instructions on the  label. Use the full amount of CHG as directed. Usually, this is one bottle. During a shower Follow these steps when using CHG solution during a shower (unless your health care provider gives you different instructions): Start the shower. Use your normal soap and shampoo to wash your face and hair. Turn off the shower or move out of the shower stream. Pour the CHG onto a clean washcloth. Do not use any type of brush or rough-edged sponge. Starting at your neck, lather your body down to your toes. Make sure you follow these instructions: If you will be having surgery, pay special attention to the part of your body where you will be having surgery. Scrub this area for at least 1 minute. Do not use CHG on your head or face. If the solution gets into your ears or eyes, rinse them well with water. Avoid your genital area. Avoid any areas of skin that have broken skin, cuts, or scrapes. Scrub your back and under your arms. Make sure to wash skin  folds. Let the lather sit on your skin for 1-2 minutes or as long as told by your health care provider. Thoroughly rinse your entire body in the shower. Make sure that all body creases and crevices are rinsed well. Dry off with a clean towel. Do not put any substances on your body afterward--such as powder, lotion, or perfume--unless you are told to do so by your health care provider. Only use lotions that are recommended by the manufacturer. Put on clean clothes or pajamas. If it is the night before your surgery, sleep in clean sheets.  During a sponge bath Follow these steps when using CHG solution during a sponge bath (unless your health care provider gives you different instructions): Use your normal soap and shampoo to wash your face and hair. Pour the CHG onto a clean washcloth. Starting at your neck, lather your body down to your toes. Make sure you follow these instructions: If you will be having surgery, pay special attention to the part of your body where you will be having surgery. Scrub this area for at least 1 minute. Do not use CHG on your head or face. If the solution gets into your ears or eyes, rinse them well with water. Avoid your genital area. Avoid any areas of skin that have broken skin, cuts, or scrapes. Scrub your back and under your arms. Make sure to wash skin folds. Let the lather sit on your skin for 1-2 minutes or as long as told by your health care provider. Using a different clean, wet washcloth, thoroughly rinse your entire body. Make sure that all body creases and crevices are rinsed well. Dry off with a clean towel. Do not put any substances on your body afterward--such as powder, lotion, or perfume--unless you are told to do so by your health care provider. Only use lotions that are recommended by the manufacturer. Put on clean clothes or pajamas. If it is the night before your surgery, sleep in clean sheets. How to use CHG prepackaged cloths Only use CHG  cloths as told by your health care provider, and follow the instructions on the label. Use the CHG cloth on clean, dry skin. Do not use the CHG cloth on your head or face unless your health care provider tells you to. When washing with the CHG cloth: Avoid your genital area. Avoid any areas of skin that have broken skin, cuts, or scrapes. Before surgery Follow these steps when using a CHG  cloth to clean before surgery (unless your health care provider gives you different instructions): Using the CHG cloth, vigorously scrub the part of your body where you will be having surgery. Scrub using a back-and-forth motion for 3 minutes. The area on your body should be completely wet with CHG when you are done scrubbing. Do not rinse. Discard the cloth and let the area air-dry. Do not put any substances on the area afterward, such as powder, lotion, or perfume. Put on clean clothes or pajamas. If it is the night before your surgery, sleep in clean sheets.  For general bathing Follow these steps when using CHG cloths for general bathing (unless your health care provider gives you different instructions). Use a separate CHG cloth for each area of your body. Make sure you wash between any folds of skin and between your fingers and toes. Wash your body in the following order, switching to a new cloth after each step: The front of your neck, shoulders, and chest. Both of your arms, under your arms, and your hands. Your stomach and groin area, avoiding the genitals. Your right leg and foot. Your left leg and foot. The back of your neck, your back, and your buttocks. Do not rinse. Discard the cloth and let the area air-dry. Do not put any substances on your body afterward--such as powder, lotion, or perfume--unless you are told to do so by your health care provider. Only use lotions that are recommended by the manufacturer. Put on clean clothes or pajamas. Contact a health care provider if: Your skin gets  irritated after scrubbing. You have questions about using your solution or cloth. You swallow any chlorhexidine. Call your local poison control center (1-478-275-0228 in the [place] Get help right away if: Your eyes itch badly, or they become very red or swollen. Your skin itches badly and is red or swollen. Your hearing changes. You have trouble seeing. You have swelling or tingling in your mouth or throat. You have trouble breathing. These symptoms may represent a serious problem that is an emergency. Do not wait to see if the symptoms will go away. Get medical help right away. Call your local emergency services (911 in the [place] Do not drive yourself to the hospital. Summary Chlorhexidine gluconate (CHG) is a germ-killing (antiseptic) solution that is used to clean the skin. Cleaning your skin with CHG may help to lower your risk for infection. You may be given CHG to use for bathing. It may be in a bottle or in a prepackaged cloth to use on your skin. Carefully follow your health care provider's instructions and the instructions on the product label. Do not use CHG if you have a chlorhexidine allergy. Contact your health care provider if your skin gets irritated after scrubbing. This information is not intended to replace advice given to you by your health care provider. Make sure you discuss any questions you have with your health care provider. Document Revised: 12/07/2021 Document Reviewed: 10/20/2020 Elsevier Patient Education  [place].

## 2022-10-14 ENCOUNTER — Encounter (HOSPITAL_COMMUNITY): Payer: Self-pay | Admitting: Gastroenterology

## 2022-10-18 ENCOUNTER — Ambulatory Visit: Payer: BC Managed Care – PPO | Admitting: Adult Health

## 2022-10-21 ENCOUNTER — Other Ambulatory Visit: Payer: Self-pay | Admitting: Neurology

## 2022-10-21 ENCOUNTER — Encounter: Payer: Self-pay | Admitting: Radiology

## 2022-10-22 ENCOUNTER — Encounter: Payer: Self-pay | Admitting: Internal Medicine

## 2022-10-22 ENCOUNTER — Other Ambulatory Visit: Payer: Self-pay | Admitting: Neurology

## 2022-10-22 DIAGNOSIS — R292 Abnormal reflex: Secondary | ICD-10-CM

## 2022-10-22 DIAGNOSIS — G2581 Restless legs syndrome: Secondary | ICD-10-CM

## 2022-10-22 DIAGNOSIS — H903 Sensorineural hearing loss, bilateral: Secondary | ICD-10-CM

## 2022-10-22 HISTORY — PX: CHOLECYSTECTOMY: SHX55

## 2022-10-22 MED ORDER — ROPINIROLE HCL 4 MG PO TABS: 4.0000 mg | ORAL_TABLET | Freq: Every day | ORAL | 3 refills | Status: DC

## 2022-10-25 ENCOUNTER — Other Ambulatory Visit: Payer: Self-pay | Admitting: Pulmonary Disease

## 2022-10-26 ENCOUNTER — Encounter (HOSPITAL_COMMUNITY)
Admission: RE | Admit: 2022-10-26 | Discharge: 2022-10-26 | Disposition: A | Discharge status: Home / Self Care | Payer: BC Managed Care – PPO | Source: Ambulatory Visit | Attending: Surgery | Admitting: Surgery

## 2022-10-27 ENCOUNTER — Encounter (HOSPITAL_COMMUNITY): Payer: Self-pay

## 2022-10-28 ENCOUNTER — Other Ambulatory Visit: Payer: Self-pay | Admitting: Internal Medicine

## 2022-10-28 ENCOUNTER — Ambulatory Visit (HOSPITAL_COMMUNITY): Payer: BC Managed Care – PPO | Admitting: Anesthesiology

## 2022-10-28 ENCOUNTER — Encounter (HOSPITAL_COMMUNITY): Payer: Self-pay | Admitting: Surgery

## 2022-10-28 ENCOUNTER — Other Ambulatory Visit: Payer: Self-pay

## 2022-10-28 ENCOUNTER — Other Ambulatory Visit (INDEPENDENT_AMBULATORY_CARE_PROVIDER_SITE_OTHER): Payer: BC Managed Care – PPO | Admitting: Surgery

## 2022-10-28 ENCOUNTER — Encounter (HOSPITAL_COMMUNITY)
Admission: RE | Disposition: A | Discharge status: Home / Self Care | Payer: Self-pay | Source: Home / Self Care | Attending: Surgery

## 2022-10-28 ENCOUNTER — Ambulatory Visit (HOSPITAL_COMMUNITY)
Admission: RE | Admit: 2022-10-28 | Discharge: 2022-10-28 | Disposition: A | Discharge status: Home / Self Care | Payer: BC Managed Care – PPO | Attending: Surgery | Admitting: Surgery

## 2022-10-28 DIAGNOSIS — G8929 Other chronic pain: Secondary | ICD-10-CM | POA: Diagnosis not present

## 2022-10-28 DIAGNOSIS — K219 Gastro-esophageal reflux disease without esophagitis: Secondary | ICD-10-CM | POA: Diagnosis not present

## 2022-10-28 DIAGNOSIS — Z87891 Personal history of nicotine dependence: Secondary | ICD-10-CM | POA: Diagnosis not present

## 2022-10-28 DIAGNOSIS — E785 Hyperlipidemia, unspecified: Secondary | ICD-10-CM | POA: Diagnosis not present

## 2022-10-28 DIAGNOSIS — G2581 Restless legs syndrome: Secondary | ICD-10-CM | POA: Diagnosis not present

## 2022-10-28 DIAGNOSIS — F419 Anxiety disorder, unspecified: Secondary | ICD-10-CM | POA: Diagnosis not present

## 2022-10-28 DIAGNOSIS — J439 Emphysema, unspecified: Secondary | ICD-10-CM | POA: Insufficient documentation

## 2022-10-28 DIAGNOSIS — R1011 Right upper quadrant pain: Secondary | ICD-10-CM | POA: Diagnosis not present

## 2022-10-28 DIAGNOSIS — K811 Chronic cholecystitis: Secondary | ICD-10-CM | POA: Insufficient documentation

## 2022-10-28 DIAGNOSIS — Z90710 Acquired absence of both cervix and uterus: Secondary | ICD-10-CM | POA: Insufficient documentation

## 2022-10-28 DIAGNOSIS — G4730 Sleep apnea, unspecified: Secondary | ICD-10-CM | POA: Insufficient documentation

## 2022-10-28 DIAGNOSIS — J449 Chronic obstructive pulmonary disease, unspecified: Secondary | ICD-10-CM | POA: Diagnosis not present

## 2022-10-28 SURGERY — CHOLECYSTECTOMY, ROBOT-ASSISTED, LAPAROSCOPIC
Anesthesia: General | Site: Abdomen

## 2022-10-28 MED ORDER — FENTANYL CITRATE (PF) 100 MCG/2ML IJ SOLN
INTRAMUSCULAR | Status: DC | PRN
  Administered 2022-10-28: 50 ug via INTRAVENOUS
  Administered 2022-10-28: 100 ug via INTRAVENOUS

## 2022-10-28 MED ORDER — MIDAZOLAM HCL 2 MG/2ML IJ SOLN
INTRAMUSCULAR | Status: AC
  Filled 2022-10-28: qty 2

## 2022-10-28 MED ORDER — KETAMINE HCL 10 MG/ML IJ SOLN
INTRAMUSCULAR | Status: DC | PRN
  Administered 2022-10-28: 20 mg via INTRAVENOUS

## 2022-10-28 MED ORDER — KETAMINE HCL 50 MG/5ML IJ SOSY
PREFILLED_SYRINGE | INTRAMUSCULAR | Status: AC
  Filled 2022-10-28: qty 5

## 2022-10-28 MED ORDER — MIDAZOLAM HCL 2 MG/2ML IJ SOLN
INTRAMUSCULAR | Status: DC | PRN
  Administered 2022-10-28: 2 mg via INTRAVENOUS

## 2022-10-28 MED ORDER — OXYCODONE HCL 5 MG PO TABS: 5.0000 mg | ORAL_TABLET | Freq: Once | ORAL | Status: DC | PRN

## 2022-10-28 MED ORDER — DEXAMETHASONE SODIUM PHOSPHATE 10 MG/ML IJ SOLN
INTRAMUSCULAR | Status: AC
  Filled 2022-10-28: qty 2

## 2022-10-28 MED ORDER — DOCUSATE SODIUM 100 MG PO CAPS: 100.0000 mg | ORAL_CAPSULE | Freq: Two times a day (BID) | ORAL | 2 refills | Status: DC

## 2022-10-28 MED ORDER — PHENYLEPHRINE 80 MCG/ML (10ML) SYRINGE FOR IV PUSH (FOR BLOOD PRESSURE SUPPORT)
PREFILLED_SYRINGE | INTRAVENOUS | Status: DC | PRN
  Administered 2022-10-28 (×2): 80 ug via INTRAVENOUS

## 2022-10-28 MED ORDER — SODIUM CHLORIDE 0.9 % IV SOLN
INTRAVENOUS | Status: AC
  Filled 2022-10-28: qty 2

## 2022-10-28 MED ORDER — OXYCODONE HCL 5 MG/5ML PO SOLN: 5.0000 mg | Freq: Once | ORAL | Status: DC | PRN

## 2022-10-28 MED ORDER — FENTANYL CITRATE (PF) 250 MCG/5ML IJ SOLN
INTRAMUSCULAR | Status: AC
  Filled 2022-10-28: qty 5

## 2022-10-28 MED ORDER — ONDANSETRON HCL 4 MG/2ML IJ SOLN
INTRAMUSCULAR | Status: DC | PRN
  Administered 2022-10-28: 4 mg via INTRAVENOUS

## 2022-10-28 MED ORDER — PROPOFOL 10 MG/ML IV BOLUS
INTRAVENOUS | Status: DC | PRN
  Administered 2022-10-28: 200 mg via INTRAVENOUS

## 2022-10-28 MED ORDER — DEXAMETHASONE SODIUM PHOSPHATE 10 MG/ML IJ SOLN
INTRAMUSCULAR | Status: AC
  Filled 2022-10-28: qty 1

## 2022-10-28 MED ORDER — ONDANSETRON HCL 4 MG/2ML IJ SOLN
INTRAMUSCULAR | Status: AC
  Filled 2022-10-28: qty 4

## 2022-10-28 MED ORDER — ACETAMINOPHEN 500 MG PO TABS: 1000.0000 mg | ORAL_TABLET | Freq: Four times a day (QID) | ORAL | 0 refills | Status: DC

## 2022-10-28 MED ORDER — SUGAMMADEX SODIUM 200 MG/2ML IV SOLN
INTRAVENOUS | Status: DC | PRN
  Administered 2022-10-28: 200 mg via INTRAVENOUS

## 2022-10-28 MED ORDER — LIDOCAINE HCL (CARDIAC) PF 100 MG/5ML IV SOSY
PREFILLED_SYRINGE | INTRAVENOUS | Status: DC | PRN
  Administered 2022-10-28: 60 mg via INTRATRACHEAL

## 2022-10-28 MED ORDER — CHLORHEXIDINE GLUCONATE CLOTH 2 % EX PADS: 6.0000 | MEDICATED_PAD | Freq: Once | CUTANEOUS | Status: DC

## 2022-10-28 MED ORDER — FENTANYL CITRATE PF 50 MCG/ML IJ SOSY
50.0000 ug | PREFILLED_SYRINGE | INTRAMUSCULAR | Status: DC | PRN
  Administered 2022-10-28: 50 ug via INTRAVENOUS
  Filled 2022-10-28 (×2): qty 1

## 2022-10-28 MED ORDER — FENTANYL CITRATE PF 50 MCG/ML IJ SOSY
25.0000 ug | PREFILLED_SYRINGE | INTRAMUSCULAR | Status: DC | PRN
  Administered 2022-10-28: 50 ug via INTRAVENOUS

## 2022-10-28 MED ORDER — ONDANSETRON HCL 4 MG/2ML IJ SOLN
INTRAMUSCULAR | Status: AC
  Filled 2022-10-28: qty 2

## 2022-10-28 MED ORDER — SODIUM CHLORIDE 0.9 % IV SOLN
2.0000 g | INTRAVENOUS | Status: AC
  Administered 2022-10-28: 2 g via INTRAVENOUS

## 2022-10-28 MED ORDER — SCOPOLAMINE 1 MG/3DAYS TD PT72
MEDICATED_PATCH | TRANSDERMAL | Status: AC
  Filled 2022-10-28: qty 1

## 2022-10-28 MED ORDER — STERILE WATER FOR IRRIGATION IR SOLN
Status: DC | PRN
  Administered 2022-10-28: 1000 mL

## 2022-10-28 MED ORDER — DEXAMETHASONE SODIUM PHOSPHATE 10 MG/ML IJ SOLN
INTRAMUSCULAR | Status: DC | PRN
  Administered 2022-10-28: 10 mg via INTRAVENOUS

## 2022-10-28 MED ORDER — INDOCYANINE GREEN 25 MG IV SOLR
INTRAVENOUS | Status: AC
  Filled 2022-10-28: qty 10

## 2022-10-28 MED ORDER — ORAL CARE MOUTH RINSE: 15.0000 mL | Freq: Once | OROMUCOSAL | Status: AC

## 2022-10-28 MED ORDER — ONDANSETRON HCL 4 MG/2ML IJ SOLN: 4.0000 mg | Freq: Once | INTRAMUSCULAR | Status: DC | PRN

## 2022-10-28 MED ORDER — ROCURONIUM BROMIDE 10 MG/ML (PF) SYRINGE
PREFILLED_SYRINGE | INTRAVENOUS | Status: DC | PRN
  Administered 2022-10-28: 70 mg via INTRAVENOUS

## 2022-10-28 MED ORDER — OXYCODONE HCL 5 MG PO TABS: 5.0000 mg | ORAL_TABLET | Freq: Four times a day (QID) | ORAL | 0 refills | Status: DC | PRN

## 2022-10-28 MED ORDER — CHLORHEXIDINE GLUCONATE 0.12 % MT SOLN
15.0000 mL | Freq: Once | OROMUCOSAL | Status: AC
  Administered 2022-10-28: 15 mL via OROMUCOSAL

## 2022-10-28 MED ORDER — LIDOCAINE HCL (PF) 2 % IJ SOLN
INTRAMUSCULAR | Status: AC
  Filled 2022-10-28: qty 5

## 2022-10-28 MED ORDER — BUPIVACAINE LIPOSOME 1.3 % IJ SUSP
INTRAMUSCULAR | Status: AC
  Filled 2022-10-28: qty 20

## 2022-10-28 MED ORDER — BUPIVACAINE LIPOSOME 1.3 % IJ SUSP
INTRAMUSCULAR | Status: DC | PRN
  Administered 2022-10-28: 20 mL

## 2022-10-28 MED ORDER — ACETAMINOPHEN 500 MG PO TABS: 500.0000 mg | ORAL_TABLET | Freq: Four times a day (QID) | ORAL | 0 refills | Status: AC

## 2022-10-28 MED ORDER — DEXMEDETOMIDINE HCL IN NACL 80 MCG/20ML IV SOLN
INTRAVENOUS | Status: DC | PRN
  Administered 2022-10-28: 8 ug via BUCCAL

## 2022-10-28 MED ORDER — SCOPOLAMINE 1 MG/3DAYS TD PT72
1.0000 | MEDICATED_PATCH | Freq: Once | TRANSDERMAL | Status: DC
  Administered 2022-10-28: 1.5 mg via TRANSDERMAL

## 2022-10-28 MED ORDER — LACTATED RINGERS IV SOLN: INTRAVENOUS | Status: DC

## 2022-10-28 MED ORDER — PROPOFOL 10 MG/ML IV BOLUS
INTRAVENOUS | Status: AC
  Filled 2022-10-28: qty 20

## 2022-10-28 MED ORDER — ROCURONIUM BROMIDE 10 MG/ML (PF) SYRINGE
PREFILLED_SYRINGE | INTRAVENOUS | Status: AC
  Filled 2022-10-28: qty 10

## 2022-10-28 MED ORDER — FENTANYL CITRATE PF 50 MCG/ML IJ SOSY
PREFILLED_SYRINGE | INTRAMUSCULAR | Status: AC
  Administered 2022-10-28: 50 ug via INTRAVENOUS
  Filled 2022-10-28: qty 1

## 2022-10-28 MED ORDER — HYDROCODONE-ACETAMINOPHEN 5-325 MG PO TABS: 1.0000 | ORAL_TABLET | Freq: Four times a day (QID) | ORAL | 0 refills | Status: DC | PRN

## 2022-10-28 MED ORDER — INDOCYANINE GREEN 25 MG IV SOLR
2.5000 mg | Freq: Once | INTRAVENOUS | Status: AC
  Administered 2022-10-28: 2.5 mg via INTRAVENOUS

## 2022-10-28 SURGICAL SUPPLY — 51 items
ADH SKN CLS APL DERMABOND .7 (GAUZE/BANDAGES/DRESSINGS) ×1
APL PRP STRL LF DISP 70% ISPRP (MISCELLANEOUS) ×1
BLADE SURG 15 STRL LF DISP TIS (BLADE) ×1 IMPLANT
BLADE SURG 15 STRL SS (BLADE) ×1
CANNULA REDUC XI 12-8 STAPL (CANNULA)
CANNULA REDUCER 12-8 DVNC XI (CANNULA) IMPLANT
CHLORAPREP W/TINT 26 (MISCELLANEOUS) ×1 IMPLANT
CLIP LIGATING HEM O LOK PURPLE (MISCELLANEOUS) ×1 IMPLANT
COVER TIP SHEARS 8 DVNC (MISCELLANEOUS) ×1 IMPLANT
COVER TIP SHEARS 8MM DA VINCI (MISCELLANEOUS) ×1
DEFOGGER SCOPE WARMER CLEARIFY (MISCELLANEOUS) IMPLANT
DERMABOND ADVANCED .7 DNX12 (GAUZE/BANDAGES/DRESSINGS) ×1 IMPLANT
DRAPE ARM DVNC X/XI (DISPOSABLE) ×4 IMPLANT
DRAPE COLUMN DVNC XI (DISPOSABLE) ×1 IMPLANT
DRAPE DA VINCI XI ARM (DISPOSABLE) ×4
DRAPE DA VINCI XI COLUMN (DISPOSABLE) ×1
ELECT REM PT RETURN 9FT ADLT (ELECTROSURGICAL) ×1
ELECTRODE REM PT RTRN 9FT ADLT (ELECTROSURGICAL) ×1 IMPLANT
GLOVE BIOGEL PI IND STRL 6.5 (GLOVE) ×2 IMPLANT
GLOVE BIOGEL PI IND STRL 7.0 (GLOVE) ×3 IMPLANT
GLOVE SURG SS PI 6.5 STRL IVOR (GLOVE) ×2 IMPLANT
GOWN STRL REUS W/TWL LRG LVL3 (GOWN DISPOSABLE) ×3 IMPLANT
GRASPER SUT TROCAR 14GX15 (MISCELLANEOUS) ×1 IMPLANT
IRRIGATOR SUCT 8 DISP DVNC XI (IRRIGATION / IRRIGATOR) IMPLANT
IRRIGATOR SUCTION 8MM XI DISP (IRRIGATION / IRRIGATOR)
IV NS IRRIG 3000ML ARTHROMATIC (IV SOLUTION) IMPLANT
MANIFOLD NEPTUNE II (INSTRUMENTS) ×1 IMPLANT
NDL HYPO 21X1.5 SAFETY (NEEDLE) ×1 IMPLANT
NDL INSUFFLATION 14GA 120MM (NEEDLE) ×1 IMPLANT
NEEDLE HYPO 21X1.5 SAFETY (NEEDLE) ×1 IMPLANT
NEEDLE INSUFFLATION 14GA 120MM (NEEDLE) ×1 IMPLANT
OBTURATOR OPTICAL STANDARD 8MM (TROCAR) ×1
OBTURATOR OPTICAL STND 8 DVNC (TROCAR) ×1
OBTURATOR OPTICALSTD 8 DVNC (TROCAR) ×1 IMPLANT
PACK LAP CHOLE LZT030E (CUSTOM PROCEDURE TRAY) ×1 IMPLANT
PAD ARMBOARD 7.5X6 YLW CONV (MISCELLANEOUS) ×1 IMPLANT
PENCIL HANDSWITCHING (ELECTRODE) IMPLANT
SEAL CANN UNIV 5-8 DVNC XI (MISCELLANEOUS) ×3 IMPLANT
SEAL XI 5MM-8MM UNIVERSAL (MISCELLANEOUS) ×3
SET BASIN LINEN APH (SET/KITS/TRAYS/PACK) ×1 IMPLANT
SET TUBE SMOKE EVAC HIGH FLOW (TUBING) ×1 IMPLANT
STAPLER CANNULA SEAL DVNC XI (STAPLE) IMPLANT
STAPLER CANNULA SEAL XI (STAPLE)
SUT MNCRL AB 4-0 PS2 18 (SUTURE) ×2 IMPLANT
SUT VICRYL 0 AB UR-6 (SUTURE) IMPLANT
SYR 20ML LL LF (SYRINGE) ×1 IMPLANT
SYS BAG RETRIEVAL 10MM (BASKET)
SYS RETRIEVAL 5MM INZII UNIV (BASKET)
SYSTEM BAG RETRIEVAL 10MM (BASKET) IMPLANT
SYSTEM RETRIEVL 5MM INZII UNIV (BASKET) IMPLANT
WATER STERILE IRR 500ML POUR (IV SOLUTION) ×1 IMPLANT

## 2022-10-28 NOTE — Progress Notes (Signed)
Patient requesting Hydrocodone instead of Oxycodone.  My office will advise the patient that she needs to return her previous prescription to the pharmacy prior to refilling her new prescription.  She also needs to decrease her Tylenol dose by half, to 500 mg QID instead of 1000 mg.  Graciella Freer, DO [facility] [address] [place], [place] [address] [phone] (office)

## 2022-10-28 NOTE — Interval H&P Note (Signed)
History and Physical Interval Note:  10/28/2022 7:24 AM  [name] [name]  has presented today for surgery, with the diagnosis of [place].  The various methods of treatment have been discussed with the patient and family. After consideration of risks, benefits and other options for treatment, the patient has consented to  Procedure(s): XI ROBOTIC [place] (N/A) as a surgical intervention.  The patient's history has been reviewed, patient examined, no change in status, stable for surgery.  I have reviewed the patient's chart and labs.  Questions were answered to the patient's satisfaction.     [place]

## 2022-10-28 NOTE — Progress Notes (Signed)
[facility]  Spoke with the patient's friend, [name], in the consultation room.  I explained that she tolerated the procedure without difficulty.  She has dissolvable stitches under the skin with overlying skin glue.  This will flake off in 10 to 14 days.  I discharged her home with a prescription for narcotic pain medication that they should take as needed for pain.  I also want her taking scheduled Tylenol.  If they take the narcotic pain medication, they should take a stool softener as well.  The patient will follow-up with me in 2 weeks for phone follow-up.  All questions were answered to her expressed satisfaction.  [name] Freer, DO [facility] [address] [place], [place] [address] [phone] (office)

## 2022-10-28 NOTE — Transfer of Care (Signed)
Immediate Anesthesia Transfer of Care Note  Patient: [name] [name]  Procedure(s) Performed: XI ROBOTIC ASSISTED LAPAROSCOPIC CHOLECYSTECTOMY (Abdomen)  Patient Location: PACU  Anesthesia Type:General  Level of Consciousness: awake, alert , and oriented  Airway & Oxygen Therapy: Patient Spontanous Breathing and Patient connected to nasal cannula oxygen  Post-op Assessment: Report given to RN and Post -op Vital signs reviewed and stable  Post vital signs: Reviewed and stable  Last Vitals:  Vitals Value Taken Time  BP 124/62 10/28/22 0924  Temp 98.3   Pulse 89 10/28/22 0924  Resp 14 10/28/22 0924  SpO2 96 % 10/28/22 0924  Vitals shown include unvalidated device data.  Last Pain:  Vitals:   10/28/22 0645  PainSc: 0-No pain         Complications: No notable events documented.

## 2022-10-28 NOTE — Discharge Instructions (Signed)
Ambulatory Surgery Discharge Instructions  General Anesthesia or Sedation Do not drive or operate heavy machinery for 24 hours.  Do not consume alcohol, tranquilizers, sleeping medications, or any non-prescribed medications for 24 hours. Do not make important decisions or sign any important papers in the next 24 hours. You should have someone with you tonight at home.  Activity  You are advised to go directly home from the hospital.  Restrict your activities and rest for a day.  Resume light activity tomorrow. No heavy lifting over 10 lbs or strenuous exercise.  Fluids and Diet Begin with clear liquids, bouillon, dry toast, soda crackers.  If not nauseated, you may go to a regular diet when you desire.  Greasy and spicy foods are not advised.  Medications  If you have not had a bowel movement in 24 hours, take 2 tablespoons over the counter Milk of mag.             You May resume your blood thinners tomorrow (Aspirin, coumadin, or other).  You are being discharged with prescriptions for Opioid/Narcotic Medications: There are some specific considerations for these medications that you should know. Opioid Meds have risks & benefits. Addiction to these meds is always a concern with prolonged use Take medication only as directed Do not drive while taking narcotic pain medication Do not crush tablets or capsules Do not use a different container than medication was dispensed in Lock the container of medication in a cool, dry place out of reach of children and pets. Opioid medication can cause addiction Do not share with anyone else (this is a felony) Do not store medications for future use. Dispose of them properly.     Disposal:  Find a [place] household drug take back site near you.  If you can't get to a drug take back site, use the recipe below as a last resort to dispose of expired, unused or unwanted drugs. Disposal  (Do not dispose chemotherapy drugs this way, talk to your  prescribing doctor instead.) Step 1: Mix drugs (do not crush) with dirt, kitty litter, or used coffee grounds and add a small amount of water to dissolve any solid medications. Step 2: Seal drugs in plastic bag. Step 3: Place plastic bag in trash. Step 4: Take prescription container and scratch out personal information, then recycle or throw away.  Operative Site  You have a liquid bandage over your incisions, this will begin to flake off in about a week. Ok to shower tomorrow. Keep wound clean and dry. No baths or swimming. No lifting more than 10 pounds.  Contact Information: If you have questions or concerns, please call our office, [phone], Monday- Thursday 8AM-5PM and Friday 8AM-12Noon.  If it is after hours or on the weekend, please call [facility] Main Number, [phone], and ask to speak to the surgeon on call for Dr. Sue Mcalexander at [place].   SPECIFIC COMPLICATIONS TO WATCH FOR: Inability to urinate Fever over 101? F by mouth Nausea and vomiting lasting longer than 24 hours. Pain not relieved by medication ordered Swelling around the operative site Increased redness, warmth, hardness, around operative area Numbness, tingling, or cold fingers or toes Blood -soaked dressing, (small amounts of oozing may be normal) Increasing and progressive drainage from surgical area or exam site  

## 2022-10-28 NOTE — Anesthesia Procedure Notes (Signed)
Procedure Name: Intubation Date/Time: 10/28/2022 7:38 AM  Performed by: Karna Dupes, CRNAPre-anesthesia Checklist: Patient identified, Emergency Drugs available, Suction available and Patient being monitored Patient Re-evaluated:Patient Re-evaluated prior to induction Oxygen Delivery Method: Circle system utilized Preoxygenation: Pre-oxygenation with 100% oxygen Induction Type: IV induction Ventilation: Mask ventilation without difficulty Laryngoscope Size: Mac and 3 Grade View: Grade I Tube type: Oral Tube size: 7.0 mm Number of attempts: 1 Airway Equipment and Method: Stylet Placement Confirmation: ETT inserted through vocal cords under direct vision, positive ETCO2 and breath sounds checked- equal and bilateral Secured at: 21 cm Tube secured with: Tape Dental Injury: Teeth and Oropharynx as per pre-operative assessment

## 2022-10-28 NOTE — Anesthesia Preprocedure Evaluation (Signed)
Anesthesia Evaluation  Patient identified by MRN, date of birth, ID band Patient awake    Reviewed: Allergy & Precautions, H&P , NPO status , Patient's Chart, lab work & pertinent test results, reviewed documented beta blocker date and time   Airway Mallampati: II  TM Distance: >3 FB Neck ROM: full    Dental no notable dental hx.    Pulmonary sleep apnea , COPD, former smoker   Pulmonary exam normal breath sounds clear to auscultation       Cardiovascular Exercise Tolerance: Good negative cardio ROS  Rhythm:regular Rate:Normal     Neuro/Psych  PSYCHIATRIC DISORDERS Anxiety     negative neurological ROS     GI/Hepatic Neg liver ROS,GERD  Medicated,,  Endo/Other  negative endocrine ROS    Renal/GU negative Renal ROS  negative genitourinary   Musculoskeletal   Abdominal   Peds  Hematology  (+) Blood dyscrasia, anemia   Anesthesia Other Findings   Reproductive/Obstetrics negative OB ROS                             Anesthesia Physical Anesthesia Plan  ASA: 3  Anesthesia Plan: General and General ETT   Post-op Pain Management:    Induction:   PONV Risk Score and Plan: Ondansetron  Airway Management Planned:   Additional Equipment:   Intra-op Plan:   Post-operative Plan:   Informed Consent: I have reviewed the patients History and Physical, chart, labs and discussed the procedure including the risks, benefits and alternatives for the proposed anesthesia with the patient or authorized representative who has indicated his/her understanding and acceptance.     Dental Advisory Given  Plan Discussed with: CRNA  Anesthesia Plan Comments:        Anesthesia Quick Evaluation

## 2022-10-28 NOTE — Progress Notes (Signed)
Awake. C/O restless leg. Did not take requip last PM. Wants to stand bedside bed. Standing bedside bed. Tolerated well.

## 2022-10-28 NOTE — Op Note (Signed)
[facility] Operative Note  10/28/2022  Preoperative Diagnosis: Chronic right upper quadrant abdominal pain   Postoperative Diagnosis: Same   Procedure(s) Performed: Robotic Assisted Laparoscopic Cholecystectomy   Surgeon: Graciella Freer, DO   Assistants: No qualified resident was available    Anesthesia: General endotracheal   Anesthesiologist: Dr. Briant Cedar   Specimens: Gallbladder   Estimated Blood Loss: Minimal   Blood Replacement: None    Complications: None   Wound Class: Clean contaminated   Operative Indications: Patient is a 67 year old female who presents for cholecystectomy.  She has symptoms consistent with biliary colic, but all of her imaging has been negative for gallstones and biliary dyskinesia.  She is agreeable to surgery.  We discussed the risk of the procedure including but not limited to bleeding, infection, injury to the common bile duct, bile leak, need for further procedures, chance of subtotal cholecystectomy.   Findings:  Non-inflamed gallbladder Critical view of safety noted All clips intact at the end of the case Adequate hemostasis   Procedure: Firefly was given in the preoperative area. The patient was taken to the operating room and placed supine. General endotracheal anesthesia was induced. Intravenous antibiotics were administered per protocol.  An orogastric tube positioned to decompress the stomach. The abdomen was prepared and draped in the usual sterile fashion. A time-out was completed verifying correct patient, procedure, site, positioning, and implant(s) and/or special equipment prior to beginning this procedure.  Veress needle was placed at the infraumbilical area and insufflation was started after confirming a positive saline drop test and no immediate increase in abdominal pressure.  After reaching 15 mm, the Veress needle was removed and a 8 mm port was placed via optiview technique infraumbilical, measuring 20 mm  away from the suspected position of the gallbladder.  The abdomen was inspected and no abnormalities or injuries were found.  Under direct vision, ports were placed in the following locations in a semi curvilinear position around the target of the gallbladder: Two 8 mm ports on the patient's right each having 8cm clearance to the adjacent ports and one 8 mm port placed on the patient's left 8 cm from the umbilical port. Once ports were placed, the table was placed in the reverse Trendelenburg position with the right side up. The Xi platform was brought into the operative field and docked to the ports successfully.  An endoscope was placed through the umbilical port, prograsp through the most lateral right port, fenestrated bipolar to the port just right of the umbilicus, and then a hook cautery in the left port.  The dome of the gallbladder was grasped with prograsp and retracted over the dome of the liver. Adhesions between the gallbladder and omentum, duodenum and transverse colon were lysed via hook cautery. The infundibulum was grasped with the fenestrated grasper and retracted toward the right lower quadrant. This maneuver exposed Calot's triangle. Firefly was used throughout the dissection to ensure safe visualization of the cystic duct.  The peritoneum overlying the gallbladder infundibulum was then dissected and the cystic duct and cystic artery identified.  Critical view of safety with the liver bed clearly visible behind the duct and artery with no additional structures noted.  The cystic duct and cystic artery were doubly clipped and divided close to the gallbladder.    The gallbladder was then dissected from its peritoneal and liver bed attachments by electrocautery. Hemostasis was checked prior to removing the hook cautery.  The Mechele Claude was undocked and moved out of the field.  A 66m Endo Catch bag was then placed through the umbilical port and the gallbladder was removed.  The gallbladder was passed off  the table as a specimen. There was no evidence of bleeding from the gallbladder fossa or cystic artery or leakage of the bile from the cystic duct stump. The left sided abdominal port was closed with 0 Vicryl on a PMI needle.  The abdomen was desufflated and secondary trocars were removed under direct vision. No bleeding was noted.  All skin incisions were closed with subcuticular sutures of 4-0 monocryl and dermabond.   Final inspection revealed acceptable hemostasis. All counts were correct at the end of the case. The patient was awakened from anesthesia and extubated without complication. The OG tube was removed.  The patient went to the PACU in stable condition.   CGraciella Freer DO [facility] [address]SIgn[address] MarvelRSal[address] [place] [phone](office)

## 2022-10-29 ENCOUNTER — Ambulatory Visit: Payer: BC Managed Care – PPO | Admitting: Pulmonary Disease

## 2022-10-29 LAB — SURGICAL PATHOLOGY

## 2022-10-29 NOTE — Anesthesia Postprocedure Evaluation (Signed)
Anesthesia Post Note  Patient: Aleiza Hammitt  Procedure(s) Performed: XI ROBOTIC ASSISTED LAPAROSCOPIC CHOLECYSTECTOMY (Abdomen)  Patient location during evaluation: Phase II Anesthesia Type: General Level of consciousness: awake Pain management: pain level controlled Vital Signs Assessment: post-procedure vital signs reviewed and stable Respiratory status: spontaneous breathing and respiratory function stable Cardiovascular status: blood pressure returned to baseline and stable Postop Assessment: no headache and no apparent nausea or vomiting Anesthetic complications: no Comments: Late entry   No notable events documented.   Last Vitals:  Vitals:   10/28/22 1020 10/28/22 1035  BP:  124/65  Pulse: 79 73  Resp: 12 18  Temp:    SpO2: 96% 97%    Last Pain:  Vitals:   10/28/22 1035  PainSc: [place]

## 2022-11-01 ENCOUNTER — Other Ambulatory Visit: Payer: Self-pay | Admitting: Internal Medicine

## 2022-11-01 ENCOUNTER — Telehealth: Payer: Self-pay | Admitting: *Deleted

## 2022-11-01 NOTE — Telephone Encounter (Signed)
Surgical Date: 10/28/2022 Procedure: XI Robotic Assisted Lap Chole  Received call from patient [phone]) [phone]~ telephone.   Patient reports increased left sided pain after surgery. States that she is taking Hydrocodone with no relief. States that she is walking and passing flatulence, but has not had BM since surgery. Also states that she has no appetite.   Patient denies fever, chills, nausea. Denies Sx of infection at incision.   Advised to add IBU for inflammation. Advised to use ice as needed.   Patient states that she cannot lie down due to pain. Appointment scheduled for follow up.

## 2022-11-03 ENCOUNTER — Encounter: Payer: Self-pay | Admitting: Surgery

## 2022-11-03 ENCOUNTER — Ambulatory Visit (INDEPENDENT_AMBULATORY_CARE_PROVIDER_SITE_OTHER): Payer: BC Managed Care – PPO | Admitting: Surgery

## 2022-11-03 ENCOUNTER — Encounter: Payer: Self-pay | Admitting: *Deleted

## 2022-11-03 VITALS — BP 107/69 | HR 77 | Temp 98.6°F | Resp 12 | Ht 69.0 in | Wt 201.0 lb

## 2022-11-03 DIAGNOSIS — Z09 Encounter for follow-up examination after completed treatment for conditions other than malignant neoplasm: Secondary | ICD-10-CM

## 2022-11-03 NOTE — Patient Instructions (Signed)
Buy Miralax or Magnesium Citrate and take daily until return of bowel function

## 2022-11-03 NOTE — Progress Notes (Signed)
[facility] Note   HPI:  67 y.o. Female presents to clinic for post-op follow-up status post robotic assisted laparoscopic cholecystectomy on 3/7.  She complains that she is having significant pain in the left side of her abdomen, more than what most of her friends who had gallbladder surgery have undergone.  She has had decreased appetite without significant nausea or vomiting.  She has been passing gas, but denies significant bowel movement since surgery.  She is still taking her narcotic pain medication for the pain.  She has not been taking any over-the-counter anti-inflammatory medications.  She denies any fevers or chills.  Review of Systems:  All other review of systems: otherwise negative   Vital Signs:  BP 107/69   Pulse 77   Temp 98.6 F (37 C) (Oral)   Resp 12   Ht 5\' 9"  (1.753 m)   Wt 201 lb (91.2 kg)   SpO2 96%   BMI 29.68 kg/m    Physical Exam:  Physical Exam Vitals reviewed.  Constitutional:      Appearance: Normal appearance.  Abdominal:     Comments: Abdomen soft, nondistended, no percussion tenderness, mild tenderness to palpation at left sided abdominal incision site; no rigidity, guarding, or rebound tenderness  Neurological:     Mental Status: She is alert.     Laboratory studies: None  Imaging:  None  Pathology: A. GALLBLADDER, CHOLECYSTECTOMY:  -  Chronic cholecystitis.   Assessment:  67 y.o. yo Female who presents for follow-up status post robotic assisted laparoscopic cholecystectomy on 3/7  Plan:  -I advised the patient that pain at her left sided incision site is to be expected, as this is the support through which I removed her gallbladder.  Because of that, she has a transfascial stitch which is partly going through some of her muscle and causing her pain.  This will continue to improve with time -Advised her that she needs to take over-the-counter anti-inflammatory medications.  She also needs to be taking daily stool  softeners and a daily laxative (MiraLAX or magnesium citrate) until she begins to have bowel movements -Advised her that all of her incision sites are healing well.  The skin glue will start to flake off in the next week -I will perform a phone follow-up with the patient in a week and a half to see how she is doing  All of the above recommendations were discussed with the patient, and all of patient's questions were answered to her expressed satisfaction.  Graciella Freer, DO [facility] [address] [place], [place] [address] [phone] (office)

## 2022-11-11 ENCOUNTER — Telehealth: Payer: Self-pay | Admitting: Family Medicine

## 2022-11-11 NOTE — Telephone Encounter (Signed)
STD Paperwork completed and faxed to [employer] at [phone]. Confirmation received.   Patient out of work 10/28/2022 and may return unrestricted on 11/22/2022.

## 2022-11-16 ENCOUNTER — Ambulatory Visit (INDEPENDENT_AMBULATORY_CARE_PROVIDER_SITE_OTHER): Payer: BC Managed Care – PPO | Admitting: Surgery

## 2022-11-16 DIAGNOSIS — Z09 Encounter for follow-up examination after completed treatment for conditions other than malignant neoplasm: Secondary | ICD-10-CM

## 2022-11-16 NOTE — Progress Notes (Unsigned)
[facility]  I am calling the patient for post operative evaluation. This is not a billable encounter as it is under the global charges for the surgery.  The patient had a *** on ***. The patient reports that *** is doing ***. The are tolerating a diet***, having good pain control***, and having regular Bms***.  The incisions are ***. The patient has *** concerns.   Pathology: ***  Will see the patient PRN.   Tariq Pernell, DO [facility] [address] [place], [place] [address] [phone] (office)  

## 2022-11-18 ENCOUNTER — Encounter: Payer: BC Managed Care – PPO | Admitting: Surgery

## 2022-11-29 ENCOUNTER — Ambulatory Visit: Payer: BC Managed Care – PPO | Admitting: Internal Medicine

## 2022-12-02 ENCOUNTER — Other Ambulatory Visit: Payer: Self-pay

## 2022-12-02 ENCOUNTER — Encounter: Payer: Self-pay | Admitting: Internal Medicine

## 2022-12-02 MED ORDER — WEGOVY 1 MG/0.5ML ~~LOC~~ SOAJ: 1.0000 mg | SUBCUTANEOUS | 0 refills | Status: DC

## 2022-12-03 ENCOUNTER — Other Ambulatory Visit: Payer: Self-pay

## 2022-12-03 ENCOUNTER — Encounter: Payer: Self-pay | Admitting: Internal Medicine

## 2022-12-03 DIAGNOSIS — E785 Hyperlipidemia, unspecified: Secondary | ICD-10-CM

## 2022-12-03 MED ORDER — ROSUVASTATIN CALCIUM 5 MG PO TABS: 5.0000 mg | ORAL_TABLET | Freq: Every day | ORAL | 3 refills | Status: DC

## 2022-12-03 MED ORDER — CITALOPRAM HYDROBROMIDE 40 MG PO TABS: 40.0000 mg | ORAL_TABLET | Freq: Every day | ORAL | 0 refills | Status: DC

## 2022-12-08 NOTE — Telephone Encounter (Signed)
Found 1 mg dose.

## 2022-12-10 ENCOUNTER — Ambulatory Visit (INDEPENDENT_AMBULATORY_CARE_PROVIDER_SITE_OTHER): Payer: BC Managed Care – PPO | Admitting: Internal Medicine

## 2022-12-10 ENCOUNTER — Encounter: Payer: Self-pay | Admitting: Internal Medicine

## 2022-12-10 VITALS — BP 127/60 | HR 84 | Ht 69.0 in | Wt 200.0 lb

## 2022-12-10 DIAGNOSIS — K909 Intestinal malabsorption, unspecified: Secondary | ICD-10-CM | POA: Insufficient documentation

## 2022-12-10 DIAGNOSIS — R112 Nausea with vomiting, unspecified: Secondary | ICD-10-CM

## 2022-12-10 DIAGNOSIS — R110 Nausea: Secondary | ICD-10-CM | POA: Diagnosis not present

## 2022-12-10 DIAGNOSIS — R197 Diarrhea, unspecified: Secondary | ICD-10-CM

## 2022-12-10 DIAGNOSIS — E669 Obesity, unspecified: Secondary | ICD-10-CM | POA: Diagnosis not present

## 2022-12-10 MED ORDER — CHOLESTYRAMINE 4 G PO PACK: PACK | ORAL | 12 refills | Status: DC

## 2022-12-10 MED ORDER — ONDANSETRON HCL 4 MG PO TABS: 4.0000 mg | ORAL_TABLET | Freq: Three times a day (TID) | ORAL | 0 refills | Status: DC | PRN

## 2022-12-10 NOTE — Patient Instructions (Signed)
It was a pleasure to see you today.  Thank you for giving [place] the opportunity to be involved in your care.  Below is a brief recap of your visit and next steps.  We will plan to see you again in 3 months.  Summary Start Questran for diarrhea Start Zofran for nausea relief Follow up in 3 months

## 2022-12-10 NOTE — Progress Notes (Signed)
Established Patient Office Visit  Subjective   Patient ID: [name] [name], female    DOB: 11/04/1955  Age: 67 y.o. MRN: [id number]  Chief Complaint  Patient presents with   restless leg    Follow up   [name] [name] returns to care today for follow-up.  She was last evaluated by me on 1/8 at which time iron infusions were ordered in the setting of iron deficiency with poorly controlled symptoms of RLS.  In the interim, she has undergone screening colonoscopy in the setting of a positive Cologuard test and also underwent laparoscopic cholecystectomy last month.  Her postoperative course has been uncomplicated.  Her colonoscopy revealed 5 nonbleeding colonic angiodysplastic lesions that were treated with APC.  One 4 mm polyp was removed from the rectum.  Pathology was subsequently consistent with tubular adenoma without high-grade dysplasia.  Repeat colonoscopy recommended for 7 years.  [name] [name] became tearful this afternoon when discussing her postoperative course.  She has experienced frequent diarrhea, soiling herself twice in public, since her surgery.  She endorses persistent nausea and experiences episodes of vomiting roughly twice per week.  She is considering retirement as these episodes have happened at work and it is quite embarrassing for her.  She is interested in exploring potential treatment options today.  She has lost approximately 21 pounds since starting Wegovy.  Past Medical History:  Diagnosis Date   Allergy Mobic   Arthritis    COPD (chronic obstructive pulmonary disease) (HCC)    Emphysema of lung (HCC)    GERD (gastroesophageal reflux disease)    Hard of hearing    Hyperlipidemia 04/13/2022   OCD (obsessive compulsive disorder)    Restless legs syndrome (RLS)    Screening for colorectal cancer 06/14/2022   Sleep apnea    Past Surgical History:  Procedure Laterality Date   ABDOMINAL HYSTERECTOMY     BIOPSY  04/22/2022   Procedure: BIOPSY;  Surgeon: Dolores Frame, MD;  Location: [facility];  Service: Gastroenterology;;   BREAST LUMPECTOMY Right    CESAREAN SECTION  1996   CHOLECYSTECTOMY  10/2022   COLONOSCOPY WITH PROPOFOL N/A 10/08/2022   Procedure: COLONOSCOPY WITH PROPOFOL;  Surgeon: Dolores Frame, MD;  Location: [facility];  Service: Gastroenterology;  Laterality: N/A;  8:15 am, LM to see if pt can come earlier   ESOPHAGOGASTRODUODENOSCOPY (EGD) WITH PROPOFOL N/A 04/22/2022   Procedure: ESOPHAGOGASTRODUODENOSCOPY (EGD) WITH PROPOFOL;  Surgeon: Dolores Frame, MD;  Location: [facility];  Service: Gastroenterology;  Laterality: N/A;  205 ASA 2   FRACTURE SURGERY     foot   JOINT REPLACEMENT     left shoulder   JOINT REPLACEMENT     right shoulder   POLYPECTOMY  10/08/2022   Procedure: POLYPECTOMY;  Surgeon: Marguerita Merles, Reuel Boom, MD;  Location: [facility];  Service: Gastroenterology;;   Social History   Tobacco Use   Smoking status: Former    Packs/day: 1.00    Years: 15.00    Additional pack years: 0.00    Total pack years: 15.00    Types: Cigarettes, E-cigarettes    Quit date: 03/23/2012    Years since quitting: 10.7    Passive exposure: Never   Smokeless tobacco: Never  Vaping Use   Vaping Use: Never used  Substance Use Topics   Alcohol use: Yes    Comment: It's not a weekly occurance   Drug use: Yes    Types: Marijuana   Family History  Problem Relation Age of Onset   Alzheimer's disease Mother    Arthritis Mother    Cancer Mother    Heart attack Father    Heart disease Father    Diabetes Brother    Cancer Brother        possibly kidney   Diabetes Paternal Grandmother    Colon cancer Neg Hx    Colon polyps Neg Hx    Allergies  Allergen Reactions   Mobic [Meloxicam] Anaphylaxis   Buspirone Anxiety   Review of Systems  Gastrointestinal:  Positive for abdominal pain, diarrhea, nausea and vomiting.  All other systems reviewed and are negative.    Objective:      BP 127/60   Pulse 84   Ht 5\' 9"  (1.753 m)   Wt 200 lb (90.7 kg)   SpO2 96%   BMI 29.53 kg/m  BP Readings from Last 3 Encounters:  12/10/22 127/60  11/03/22 107/69  10/28/22 124/65   Physical Exam Vitals reviewed.  Constitutional:      General: She is not in acute distress.    Appearance: Normal appearance. She is not toxic-appearing.  HENT:     Head: Normocephalic and atraumatic.     Right Ear: External ear normal.     Left Ear: External ear normal.     Nose: Nose normal. No congestion or rhinorrhea.     Mouth/Throat:     Mouth: Mucous membranes are moist.     Pharynx: Oropharynx is clear. No oropharyngeal exudate or posterior oropharyngeal erythema.  Eyes:     General: No scleral icterus.    Extraocular Movements: Extraocular movements intact.     Conjunctiva/sclera: Conjunctivae normal.     Pupils: Pupils are equal, round, and reactive to light.  Cardiovascular:     Rate and Rhythm: Normal rate and regular rhythm.     Pulses: Normal pulses.     Heart sounds: Normal heart sounds. No murmur heard.    No friction rub. No gallop.  Pulmonary:     Effort: Pulmonary effort is normal.     Breath sounds: Normal breath sounds. No wheezing, rhonchi or rales.  Abdominal:     General: Abdomen is flat. Bowel sounds are normal. There is no distension.     Palpations: Abdomen is soft.     Tenderness: There is no abdominal tenderness.     Comments: Well-healed abdominal laparoscopic surgical incisions  Musculoskeletal:        General: No swelling. Normal range of motion.     Cervical back: Normal range of motion.     Right lower leg: No edema.     Left lower leg: No edema.  Lymphadenopathy:     Cervical: No cervical adenopathy.  Skin:    General: Skin is warm and dry.     Capillary Refill: Capillary refill takes less than 2 seconds.     Coloration: Skin is not jaundiced.  Neurological:     General: No focal deficit present.     Mental Status: She is alert and oriented  to person, place, and time.  Psychiatric:     Comments: Tearful during today's encounter   Last CBC Lab Results  Component Value Date   WBC 6.1 04/13/2022   HGB 12.3 04/13/2022   HCT 38.7 04/13/2022   MCV 91 04/13/2022   MCH 28.9 04/13/2022   RDW 13.2 04/13/2022   PLT 192 04/13/2022   Last metabolic panel Lab Results  Component Value Date   GLUCOSE 90 04/20/2022  NA 144 04/20/2022   K 4.7 04/20/2022   CL 104 04/20/2022   CO2 24 04/20/2022   BUN 11 04/20/2022   CREATININE 0.79 04/20/2022   EGFR 83 04/20/2022   CALCIUM 9.1 04/20/2022   PROT 6.9 04/13/2022   ALBUMIN 4.2 04/13/2022   LABGLOB 2.7 04/13/2022   AGRATIO 1.6 04/13/2022   BILITOT 0.4 04/13/2022   ALKPHOS 105 04/13/2022   AST 11 04/13/2022   ALT 8 04/13/2022   Last lipids Lab Results  Component Value Date   CHOL 158 04/13/2022   HDL 44 04/13/2022   LDLCALC 90 04/13/2022   TRIG 135 04/13/2022   CHOLHDL 3.6 04/13/2022   Last hemoglobin A1c Lab Results  Component Value Date   HGBA1C 5.7 (H) 04/13/2022   Last thyroid functions Lab Results  Component Value Date   TSH 2.860 04/13/2022   Last vitamin D Lab Results  Component Value Date   VD25OH 38.4 04/13/2022   Last vitamin B12 and Folate Lab Results  Component Value Date   VITAMINB12 372 04/13/2022   FOLATE 2.4 (L) 04/13/2022   The 10-year ASCVD risk score (Arnett DK, et al., 2019) is: 4.8%    Assessment & Plan:   Problem List Items Addressed This Visit       Nausea & vomiting    Zofran prescribed for as needed nausea relief      Diarrhea due to malabsorption - Primary    She is experiencing postcholecystectomy diarrhea as described above. -Try cholestyramine for symptom relief      Obesity (BMI 30-39.9)    She has lost 21 pounds since starting Wegovy.  Currently prescribed 1 mg weekly. -Increase Wegovy to 1.7 mg weekly beginning next week.      Return in about 3 months (around 03/11/2023).   Billie Lade, MD

## 2022-12-10 NOTE — Assessment & Plan Note (Signed)
She is experiencing postcholecystectomy diarrhea.

## 2022-12-16 NOTE — Assessment & Plan Note (Signed)
She has lost 21 pounds since starting Wegovy.  Currently prescribed 1 mg weekly. -Increase Wegovy to 1.7 mg weekly beginning next week.

## 2022-12-16 NOTE — Assessment & Plan Note (Signed)
Zofran prescribed for as needed nausea relief

## 2022-12-17 ENCOUNTER — Encounter: Payer: Self-pay | Admitting: Internal Medicine

## 2022-12-17 DIAGNOSIS — E669 Obesity, unspecified: Secondary | ICD-10-CM

## 2022-12-17 MED ORDER — SEMAGLUTIDE-WEIGHT MANAGEMENT 1.7 MG/0.75ML ~~LOC~~ SOAJ: 1.7000 mg | SUBCUTANEOUS | 0 refills | Status: AC

## 2022-12-31 ENCOUNTER — Other Ambulatory Visit: Payer: Self-pay | Admitting: Internal Medicine

## 2023-01-07 ENCOUNTER — Encounter: Payer: Self-pay | Admitting: Pulmonary Disease

## 2023-01-07 ENCOUNTER — Ambulatory Visit (INDEPENDENT_AMBULATORY_CARE_PROVIDER_SITE_OTHER): Payer: BC Managed Care – PPO | Admitting: Pulmonary Disease

## 2023-01-07 VITALS — BP 116/73 | HR 88 | Ht 69.0 in | Wt 194.6 lb

## 2023-01-07 DIAGNOSIS — G4733 Obstructive sleep apnea (adult) (pediatric): Secondary | ICD-10-CM

## 2023-01-07 DIAGNOSIS — R0602 Shortness of breath: Secondary | ICD-10-CM | POA: Diagnosis not present

## 2023-01-07 DIAGNOSIS — J449 Chronic obstructive pulmonary disease, unspecified: Secondary | ICD-10-CM | POA: Diagnosis not present

## 2023-01-07 DIAGNOSIS — G2581 Restless legs syndrome: Secondary | ICD-10-CM

## 2023-01-07 NOTE — Progress Notes (Signed)
   Subjective:    Patient ID: [name] [name], female    DOB: 12-29-1955, 67 y.o.   MRN: [id number]  HPI 66-yo ex-smoker for FU of COPD, OSA and restless leg syndrome.   RLS was diagnosed in her 30s - maintained on Requip 4 mg, before bedtime, gabapentin 600 mg up to 5 tablets a day .  She reports that gabapentin makes her feel like a zombie in the daytime and has affected her balance.  She tried Neupro patch but could not tolerate side effects   OSA was diagnosed in the past, mild, oral appliance was tried which she did not tolerate she is finally settled down with a [employer] fullface mask.  We reviewed sleep study.   She smoked about a pack per day for more than 20 years before she quit in 2013, more than 20 pack years.   Chief Complaint  Patient presents with   Follow-up    Pt f/u states that she is doing well not using her oral appliance for sleep apnea due to difficulty getting it in/out of her mouth. Still using Trelegy and albuterol as needed.    We checked iron levels, found this to be low and she received iron infusions.  This helped her restless legs.  She tried mechanical compression device (made her symptoms worse. She continues on gabapentin 3 times daily and Requip 4 mg at bedtime.  Gabapentin does sedate her in the daytime. She could not tolerate dental appliance or PAP therapy and is currently not on any therapy for OSA .  She has lost significant weight about 25 pounds since her sleep study She complains of mild shortness of breath and Trelegy seems to help-but does not know if she truly has COPD and if so how severe   Significant tests/ events reviewed  LDCT 08/2020 -Previous nodules resolved, 4 mm nodule in the lingula     06/2021 >> NPSG >>AHI 11/h, predom supine, low sat 84% (220 lbs )  RBD noticed throughout study/sleep talking and waking up confused. Significant leg and body jerks noticed during testing     Obstructive sleep apnea with AHI of 14, RDI of  27, oxygen nadir of 87% -Titrated to a CPAP and subsequently to a BiPAP of 20/16   Review of Systems neg for any significant sore throat, dysphagia, itching, sneezing, nasal congestion or excess/ purulent secretions, fever, chills, sweats, unintended wt loss, pleuritic or exertional cp, hempoptysis, orthopnea pnd or change in chronic leg swelling. Also denies presyncope, palpitations, heartburn, abdominal pain, nausea, vomiting, diarrhea or change in bowel or urinary habits, dysuria,hematuria, rash, arthralgias, visual complaints, headache, numbness weakness or ataxia.     Objective:   Physical Exam  Gen. Pleasant, obese, in no distress ENT - no lesions, no post nasal drip Neck: No JVD, no thyromegaly, no carotid bruits Lungs: no use of accessory muscles, no dullness to percussion, decreased without rales or rhonchi  Cardiovascular: Rhythm regular, heart sounds  normal, no murmurs or gallops, no peripheral edema Musculoskeletal: No deformities, no cyanosis or clubbing , no tremors       Assessment & Plan:

## 2023-01-07 NOTE — Assessment & Plan Note (Signed)
We discussed that mild OSA does not have significant cardiovascular consequences. She has lost 25 pounds since her sleep study and we will repeat home sleep test to assess.  She likely does not need any therapy for OSA

## 2023-01-07 NOTE — Patient Instructions (Addendum)
X Home sleep test  Try to decrease gabapentin to 1-2-2 , then go down to 1-1-2  X schedule pFTs

## 2023-01-07 NOTE — Assessment & Plan Note (Signed)
Obtain PFTs to assess. Continue Trelegy , if no significant airway obstruction, will cut down to Trelegy 100

## 2023-01-07 NOTE — Assessment & Plan Note (Addendum)
Due to side effects from gabapentin.  We discussed decreasing dose especially in the daytime she can continue with 2 tablets in the evening and try a slow taper in the daytime She does seem to have augmentation phenomenon and has cut down Requip to once at bedtime

## 2023-01-25 ENCOUNTER — Encounter: Payer: Self-pay | Admitting: Internal Medicine

## 2023-01-25 DIAGNOSIS — E669 Obesity, unspecified: Secondary | ICD-10-CM

## 2023-01-25 MED ORDER — SEMAGLUTIDE-WEIGHT MANAGEMENT 2.4 MG/0.75ML ~~LOC~~ SOAJ: 2.4000 mg | SUBCUTANEOUS | 1 refills | Status: DC

## 2023-02-02 ENCOUNTER — Encounter: Payer: Self-pay | Admitting: Internal Medicine

## 2023-02-14 ENCOUNTER — Encounter (HOSPITAL_BASED_OUTPATIENT_CLINIC_OR_DEPARTMENT_OTHER): Payer: BC Managed Care – PPO

## 2023-02-15 ENCOUNTER — Other Ambulatory Visit: Payer: Self-pay | Admitting: Internal Medicine

## 2023-02-18 ENCOUNTER — Other Ambulatory Visit: Payer: Self-pay

## 2023-02-18 ENCOUNTER — Encounter: Payer: Self-pay | Admitting: Internal Medicine

## 2023-02-18 DIAGNOSIS — E669 Obesity, unspecified: Secondary | ICD-10-CM

## 2023-02-18 MED ORDER — SEMAGLUTIDE-WEIGHT MANAGEMENT 2.4 MG/0.75ML ~~LOC~~ SOAJ: 2.4000 mg | SUBCUTANEOUS | 2 refills | Status: DC

## 2023-02-28 ENCOUNTER — Telehealth: Payer: Self-pay | Admitting: Pulmonary Disease

## 2023-02-28 ENCOUNTER — Ambulatory Visit (INDEPENDENT_AMBULATORY_CARE_PROVIDER_SITE_OTHER): Payer: BC Managed Care – PPO

## 2023-02-28 DIAGNOSIS — G4733 Obstructive sleep apnea (adult) (pediatric): Secondary | ICD-10-CM

## 2023-02-28 NOTE — Telephone Encounter (Signed)
Surprisingly, HST showed moderate  OSA with AHI 29/ hr & low sat of 77% Suggest CPAP titration study or autoCPAP if she is willing to try again  Schedule office visit to discuss if she has more questions

## 2023-03-02 NOTE — Telephone Encounter (Signed)
Called the pt and there was no answer- LMTCB    

## 2023-03-09 ENCOUNTER — Other Ambulatory Visit: Payer: Self-pay

## 2023-03-09 NOTE — Telephone Encounter (Signed)
Call placed to patient. LMTRC.   Also sent [facility] message.

## 2023-03-11 ENCOUNTER — Ambulatory Visit: Payer: BC Managed Care – PPO | Admitting: Internal Medicine

## 2023-03-13 ENCOUNTER — Other Ambulatory Visit: Payer: Self-pay | Admitting: Internal Medicine

## 2023-03-18 ENCOUNTER — Other Ambulatory Visit: Payer: Self-pay | Admitting: Neurology

## 2023-03-30 ENCOUNTER — Encounter: Payer: Self-pay | Admitting: Internal Medicine

## 2023-04-01 ENCOUNTER — Other Ambulatory Visit: Payer: Self-pay

## 2023-04-01 DIAGNOSIS — E669 Obesity, unspecified: Secondary | ICD-10-CM

## 2023-04-01 MED ORDER — SEMAGLUTIDE-WEIGHT MANAGEMENT 2.4 MG/0.75ML ~~LOC~~ SOAJ: 2.4000 mg | SUBCUTANEOUS | 2 refills | Status: DC

## 2023-04-06 DIAGNOSIS — E785 Hyperlipidemia, unspecified: Secondary | ICD-10-CM | POA: Diagnosis not present

## 2023-04-06 DIAGNOSIS — F429 Obsessive-compulsive disorder, unspecified: Secondary | ICD-10-CM | POA: Diagnosis not present

## 2023-04-06 DIAGNOSIS — Z809 Family history of malignant neoplasm, unspecified: Secondary | ICD-10-CM | POA: Diagnosis not present

## 2023-04-06 DIAGNOSIS — J449 Chronic obstructive pulmonary disease, unspecified: Secondary | ICD-10-CM | POA: Diagnosis not present

## 2023-04-06 DIAGNOSIS — G2581 Restless legs syndrome: Secondary | ICD-10-CM | POA: Diagnosis not present

## 2023-04-06 DIAGNOSIS — Z87891 Personal history of nicotine dependence: Secondary | ICD-10-CM | POA: Diagnosis not present

## 2023-04-06 DIAGNOSIS — Z8249 Family history of ischemic heart disease and other diseases of the circulatory system: Secondary | ICD-10-CM | POA: Diagnosis not present

## 2023-04-06 DIAGNOSIS — Z87892 Personal history of anaphylaxis: Secondary | ICD-10-CM | POA: Diagnosis not present

## 2023-04-06 DIAGNOSIS — Z008 Encounter for other general examination: Secondary | ICD-10-CM | POA: Diagnosis not present

## 2023-04-06 DIAGNOSIS — Z886 Allergy status to analgesic agent status: Secondary | ICD-10-CM | POA: Diagnosis not present

## 2023-04-06 DIAGNOSIS — K219 Gastro-esophageal reflux disease without esophagitis: Secondary | ICD-10-CM | POA: Diagnosis not present

## 2023-04-06 DIAGNOSIS — R32 Unspecified urinary incontinence: Secondary | ICD-10-CM | POA: Diagnosis not present

## 2023-04-12 ENCOUNTER — Other Ambulatory Visit: Payer: Self-pay | Admitting: Internal Medicine

## 2023-04-15 ENCOUNTER — Encounter: Payer: Self-pay | Admitting: Internal Medicine

## 2023-04-15 MED ORDER — GABAPENTIN 600 MG PO TABS: ORAL_TABLET | ORAL | 0 refills | Status: DC

## 2023-04-26 ENCOUNTER — Encounter: Payer: Self-pay | Admitting: Internal Medicine

## 2023-04-26 ENCOUNTER — Ambulatory Visit: Payer: BC Managed Care – PPO | Admitting: Internal Medicine

## 2023-04-26 VITALS — BP 120/64 | HR 88 | Ht 69.0 in | Wt 178.4 lb

## 2023-04-26 DIAGNOSIS — D649 Anemia, unspecified: Secondary | ICD-10-CM

## 2023-04-26 DIAGNOSIS — Z23 Encounter for immunization: Secondary | ICD-10-CM

## 2023-04-26 DIAGNOSIS — J449 Chronic obstructive pulmonary disease, unspecified: Secondary | ICD-10-CM

## 2023-04-26 DIAGNOSIS — E669 Obesity, unspecified: Secondary | ICD-10-CM

## 2023-04-26 DIAGNOSIS — Z13820 Encounter for screening for osteoporosis: Secondary | ICD-10-CM | POA: Insufficient documentation

## 2023-04-26 DIAGNOSIS — F419 Anxiety disorder, unspecified: Secondary | ICD-10-CM

## 2023-04-26 DIAGNOSIS — Z1231 Encounter for screening mammogram for malignant neoplasm of breast: Secondary | ICD-10-CM

## 2023-04-26 DIAGNOSIS — G2581 Restless legs syndrome: Secondary | ICD-10-CM | POA: Diagnosis not present

## 2023-04-26 DIAGNOSIS — K219 Gastro-esophageal reflux disease without esophagitis: Secondary | ICD-10-CM

## 2023-04-26 DIAGNOSIS — R7303 Prediabetes: Secondary | ICD-10-CM | POA: Diagnosis not present

## 2023-04-26 DIAGNOSIS — E785 Hyperlipidemia, unspecified: Secondary | ICD-10-CM | POA: Diagnosis not present

## 2023-04-26 DIAGNOSIS — Z1239 Encounter for other screening for malignant neoplasm of breast: Secondary | ICD-10-CM | POA: Insufficient documentation

## 2023-04-26 DIAGNOSIS — E559 Vitamin D deficiency, unspecified: Secondary | ICD-10-CM | POA: Diagnosis not present

## 2023-04-26 DIAGNOSIS — E611 Iron deficiency: Secondary | ICD-10-CM

## 2023-04-26 NOTE — Patient Instructions (Signed)
It was a pleasure to see you today.  Thank you for giving [place] the opportunity to be involved in your care.  Below is a brief recap of your visit and next steps.  We will plan to see you again in 6 months.  Summary No medication changes today. Please continue to monitor your weight and we can reduce Wegovy dosing if needed Repeat labs ordered Mammogram and bone density scan ordered Follow up in 6 months

## 2023-04-26 NOTE — Assessment & Plan Note (Signed)
DEXA scan ordered today 

## 2023-04-26 NOTE — Assessment & Plan Note (Signed)
Screening bilateral mammogram ordered today

## 2023-04-26 NOTE — Assessment & Plan Note (Signed)
Currently prescribed rosuvastatin 5 mg daily.  Repeat lipid panel ordered today. 

## 2023-04-26 NOTE — Assessment & Plan Note (Signed)
 Symptoms are adequately controlled with omeprazole 40 mg daily.  No medication changes are indicated today.

## 2023-04-26 NOTE — Assessment & Plan Note (Signed)
She has lost 55 pounds since starting Wegovy.  Currently prescribed 2.4 mg weekly.  She is pleased with her current weight loss and is ready to transition to maintenance dosing.  She will continue 2.4 mg weekly for now and notify us if she continues to lose weight.  We will gradually reduce the dose to 1 mg weekly when appropriate.

## 2023-04-26 NOTE — Assessment & Plan Note (Signed)
Followed by neurology.  Symptoms are adequately controlled with gabapentin and ropinirole.  No medication changes are indicated today.

## 2023-04-26 NOTE — Assessment & Plan Note (Signed)
Influenza vaccine administered today.

## 2023-04-26 NOTE — Progress Notes (Signed)
Established Patient Office Visit  Subjective   Patient ID: [name] [name], female    DOB: 09/22/1955  Age: 67 y.o. MRN: [id number]  Chief Complaint  Patient presents with   Prediabetes    Follow up   [name] returns to care today for routine follow-up.  She was last evaluated by me on 4/19.  At that time her acute concern was diarrhea after undergoing cholecystectomy.  Cholestyramine was prescribed for symptom relief.  Reginal Lutes was also increased to 1.7 mg weekly.  In the interim she has been evaluated by pulmonology for follow-up.  There have otherwise been no acute interval events.  [name] reports feeling well today.  She is asymptomatic and has no acute concerns to discuss.  She is very pleased with her weight loss.  She is down a total of 55 pounds and states that she has more energy.  Past Medical History:  Diagnosis Date   Allergy Mobic   Arthritis    COPD (chronic obstructive pulmonary disease) (HCC)    Emphysema of lung (HCC)    GERD (gastroesophageal reflux disease)    Hard of hearing    Hyperlipidemia 04/13/2022   OCD (obsessive compulsive disorder)    Restless legs syndrome (RLS)    Screening for colorectal cancer 06/14/2022   Sleep apnea    Past Surgical History:  Procedure Laterality Date   ABDOMINAL HYSTERECTOMY     BIOPSY  04/22/2022   Procedure: BIOPSY;  Surgeon: Dolores Frame, MD;  Location: [facility];  Service: Gastroenterology;;   BREAST LUMPECTOMY Right    CESAREAN SECTION  1996   CHOLECYSTECTOMY  10/2022   COLONOSCOPY WITH PROPOFOL N/A 10/08/2022   Procedure: COLONOSCOPY WITH PROPOFOL;  Surgeon: Dolores Frame, MD;  Location: [facility];  Service: Gastroenterology;  Laterality: N/A;  8:15 am, LM to see if pt can come earlier   ESOPHAGOGASTRODUODENOSCOPY (EGD) WITH PROPOFOL N/A 04/22/2022   Procedure: ESOPHAGOGASTRODUODENOSCOPY (EGD) WITH PROPOFOL;  Surgeon: Dolores Frame, MD;  Location: [facility];  Service:  Gastroenterology;  Laterality: N/A;  205 ASA 2   FRACTURE SURGERY     foot   JOINT REPLACEMENT     left shoulder   JOINT REPLACEMENT     right shoulder   POLYPECTOMY  10/08/2022   Procedure: POLYPECTOMY;  Surgeon: Marguerita Merles, Reuel Boom, MD;  Location: [facility];  Service: Gastroenterology;;   Social History   Tobacco Use   Smoking status: Former    Current packs/day: 0.00    Average packs/day: 1 pack/day for 15.0 years (15.0 ttl pk-yrs)    Types: Cigarettes, E-cigarettes    Start date: 03/23/1997    Quit date: 03/23/2012    Years since quitting: 11.0    Passive exposure: Never   Smokeless tobacco: Never  Vaping Use   Vaping status: Never Used  Substance Use Topics   Alcohol use: Yes    Comment: It's not a weekly occurance   Drug use: Yes    Types: Marijuana   Family History  Problem Relation Age of Onset   Alzheimer's disease Mother    Arthritis Mother    Cancer Mother    Heart attack Father    Heart disease Father    Diabetes Brother    Cancer Brother        possibly kidney   Diabetes Paternal Grandmother    Colon cancer Neg Hx    Colon polyps Neg Hx    Allergies  Allergen Reactions  Mobic [Meloxicam] Anaphylaxis   Buspirone Anxiety   Review of Systems  Constitutional:  Negative for chills and fever.  HENT:  Negative for sore throat.   Respiratory:  Negative for cough and shortness of breath.   Cardiovascular:  Negative for chest pain, palpitations and leg swelling.  Gastrointestinal:  Negative for abdominal pain, blood in stool, constipation, diarrhea, nausea and vomiting.  Genitourinary:  Negative for dysuria and hematuria.  Musculoskeletal:  Negative for myalgias.  Skin:  Negative for itching and rash.  Neurological:  Negative for dizziness and headaches.  Psychiatric/Behavioral:  Negative for depression and suicidal ideas.      Objective:     BP 120/64   Pulse 88   Ht 5\' 9"  (1.753 m)   Wt 178 lb 6.4 oz (80.9 kg)   SpO2 96%   BMI 26.35  kg/m  BP Readings from Last 3 Encounters:  04/26/23 120/64  01/07/23 116/73  12/10/22 127/60   Physical Exam Vitals reviewed.  Constitutional:      General: She is not in acute distress.    Appearance: Normal appearance. She is not toxic-appearing.  HENT:     Head: Normocephalic and atraumatic.     Right Ear: External ear normal.     Left Ear: External ear normal.     Nose: Nose normal. No congestion or rhinorrhea.     Mouth/Throat:     Mouth: Mucous membranes are moist.     Pharynx: Oropharynx is clear. No oropharyngeal exudate or posterior oropharyngeal erythema.  Eyes:     General: No scleral icterus.    Extraocular Movements: Extraocular movements intact.     Conjunctiva/sclera: Conjunctivae normal.     Pupils: Pupils are equal, round, and reactive to light.  Cardiovascular:     Rate and Rhythm: Normal rate and regular rhythm.     Pulses: Normal pulses.     Heart sounds: Normal heart sounds. No murmur heard.    No friction rub. No gallop.  Pulmonary:     Effort: Pulmonary effort is normal.     Breath sounds: Normal breath sounds. No wheezing, rhonchi or rales.  Abdominal:     General: Abdomen is flat. Bowel sounds are normal. There is no distension.     Palpations: Abdomen is soft.     Tenderness: There is no abdominal tenderness.  Musculoskeletal:        General: No swelling. Normal range of motion.     Cervical back: Normal range of motion.     Right lower leg: No edema.     Left lower leg: No edema.  Lymphadenopathy:     Cervical: No cervical adenopathy.  Skin:    General: Skin is warm and dry.     Capillary Refill: Capillary refill takes less than 2 seconds.     Coloration: Skin is not jaundiced.  Neurological:     General: No focal deficit present.     Mental Status: She is alert and oriented to person, place, and time.  Psychiatric:        Mood and Affect: Mood normal.        Behavior: Behavior normal.   Last CBC Lab Results  Component Value Date    WBC 6.1 04/13/2022   HGB 12.3 04/13/2022   HCT 38.7 04/13/2022   MCV 91 04/13/2022   MCH 28.9 04/13/2022   RDW 13.2 04/13/2022   PLT 192 04/13/2022   Last metabolic panel Lab Results  Component Value Date   GLUCOSE 90 04/20/2022  NA 144 04/20/2022   K 4.7 04/20/2022   CL 104 04/20/2022   CO2 24 04/20/2022   BUN 11 04/20/2022   CREATININE 0.79 04/20/2022   EGFR 83 04/20/2022   CALCIUM 9.1 04/20/2022   PROT 6.9 04/13/2022   ALBUMIN 4.2 04/13/2022   LABGLOB 2.7 04/13/2022   AGRATIO 1.6 04/13/2022   BILITOT 0.4 04/13/2022   ALKPHOS 105 04/13/2022   AST 11 04/13/2022   ALT 8 04/13/2022   Last lipids Lab Results  Component Value Date   CHOL 158 04/13/2022   HDL 44 04/13/2022   LDLCALC 90 04/13/2022   TRIG 135 04/13/2022   CHOLHDL 3.6 04/13/2022   Last hemoglobin A1c Lab Results  Component Value Date   HGBA1C 5.7 (H) 04/13/2022   Last thyroid functions Lab Results  Component Value Date   TSH 2.860 04/13/2022   Last vitamin D Lab Results  Component Value Date   VD25OH 38.4 04/13/2022   Last vitamin B12 and Folate Lab Results  Component Value Date   VITAMINB12 372 04/13/2022   FOLATE 2.4 (L) 04/13/2022   The 10-year ASCVD risk score (Arnett DK, et al., 2019) is: 4.8%    Assessment & Plan:   Problem List Items Addressed This Visit       COPD (chronic obstructive pulmonary disease) (HCC)    Recently evaluated by pulmonology for follow-up.  She is prescribed Trelegy.  Asymptomatic currently.  Pulmonary exam is unremarkable.      GERD (gastroesophageal reflux disease)    Symptoms are adequately controlled with omeprazole 40 mg daily.  No medication changes are indicated today.      Obesity (BMI 30-39.9)    She has lost 55 pounds since starting Wegovy.  Currently prescribed 2.4 mg weekly.  She is pleased with her current weight loss and is ready to transition to maintenance dosing.  She will continue 2.4 mg weekly for now and notify us if she continues  to lose weight.  We will gradually reduce the dose to 1 mg weekly when appropriate.      Anxiety    Anxiety remains adequately controlled with Celexa 40 mg daily.      Restless leg syndrome    Followed by neurology.  Symptoms are adequately controlled with gabapentin and ropinirole.  No medication changes are indicated today.      Hyperlipidemia    Currently prescribed rosuvastatin 5 mg daily.  Repeat lipid panel ordered today.      Breast cancer screening    Screening bilateral mammogram ordered today      Osteoporosis screening    DEXA scan ordered today      Need for influenza vaccination    Influenza vaccine administered today      Return in about 6 months (around 10/24/2023).   Billie Lade, MD

## 2023-04-26 NOTE — Assessment & Plan Note (Signed)
Recently evaluated by pulmonology for follow-up.  She is prescribed Trelegy.  Asymptomatic currently.  Pulmonary exam is unremarkable.

## 2023-04-26 NOTE — Assessment & Plan Note (Signed)
Anxiety remains adequately controlled with Celexa 40 mg daily.

## 2023-04-27 LAB — LIPID PANEL
Chol/HDL Ratio: 3.5 ratio (ref 0.0–4.4)
Cholesterol, Total: 139 mg/dL (ref 100–199)
HDL: 40 mg/dL (ref >39)
LDL Chol Calc (NIH): 79 mg/dL (ref 0–99)
Triglycerides: 107 mg/dL (ref 0–149)
VLDL Cholesterol Cal: 20 mg/dL (ref 5–40)

## 2023-04-27 LAB — CMP14+EGFR
ALT: 14 IU/L (ref 0–32)
AST: 15 IU/L (ref 0–40)
Albumin: 4 g/dL (ref 3.9–4.9)
Alkaline Phosphatase: 88 IU/L (ref 44–121)
BUN/Creatinine Ratio: 13 (ref 12–28)
BUN: 11 mg/dL (ref 8–27)
Bilirubin Total: 0.3 mg/dL (ref 0.0–1.2)
CO2: 25 mmol/L (ref 20–29)
Calcium: 9.1 mg/dL (ref 8.7–10.3)
Chloride: 105 mmol/L (ref 96–106)
Creatinine, Ser: 0.87 mg/dL (ref 0.57–1.00)
Globulin, Total: 2.3 g/dL (ref 1.5–4.5)
Glucose: 89 mg/dL (ref 70–99)
Potassium: 4.4 mmol/L (ref 3.5–5.2)
Sodium: 143 mmol/L (ref 134–144)
Total Protein: 6.3 g/dL (ref 6.0–8.5)
eGFR: 74 mL/min/{1.73_m2} (ref >59)

## 2023-04-27 LAB — CBC WITH DIFFERENTIAL/PLATELET
Basophils Absolute: 0 10*3/uL (ref 0.0–0.2)
Basos: 1 %
EOS (ABSOLUTE): 0.1 10*3/uL (ref 0.0–0.4)
Eos: 2 %
Hematocrit: 37 % (ref 34.0–46.6)
Hemoglobin: 12.4 g/dL (ref 11.1–15.9)
Immature Grans (Abs): 0 10*3/uL (ref 0.0–0.1)
Immature Granulocytes: 0 %
Lymphocytes Absolute: 1.9 10*3/uL (ref 0.7–3.1)
Lymphs: 34 %
MCH: 30.2 pg (ref 26.6–33.0)
MCHC: 33.5 g/dL (ref 31.5–35.7)
MCV: 90 fL (ref 79–97)
Monocytes Absolute: 0.4 10*3/uL (ref 0.1–0.9)
Monocytes: 7 %
Neutrophils Absolute: 3.2 10*3/uL (ref 1.4–7.0)
Neutrophils: 56 %
Platelets: 196 10*3/uL (ref 150–450)
RBC: 4.11 x10E6/uL (ref 3.77–5.28)
RDW: 12.5 % (ref 11.7–15.4)
WBC: 5.6 10*3/uL (ref 3.4–10.8)

## 2023-04-27 LAB — HEMOGLOBIN A1C
Est. average glucose Bld gHb Est-mCnc: 111 mg/dL
Hgb A1c MFr Bld: 5.5 % (ref 4.8–5.6)

## 2023-04-27 LAB — B12 AND FOLATE PANEL
Folate: 2 ng/mL — ABNORMAL LOW (ref >3.0)
Vitamin B-12: 129 pg/mL — ABNORMAL LOW (ref 232–1245)

## 2023-04-27 LAB — TSH+FREE T4
Free T4: 0.87 ng/dL (ref 0.82–1.77)
TSH: 1.49 u[IU]/mL (ref 0.450–4.500)

## 2023-04-27 LAB — VITAMIN D 25 HYDROXY (VIT D DEFICIENCY, FRACTURES): Vit D, 25-Hydroxy: 44.6 ng/mL (ref 30.0–100.0)

## 2023-04-28 ENCOUNTER — Other Ambulatory Visit (INDEPENDENT_AMBULATORY_CARE_PROVIDER_SITE_OTHER): Payer: Self-pay | Admitting: Gastroenterology

## 2023-04-28 NOTE — Telephone Encounter (Signed)
Needs office visit. Patient last seen in July 2023.

## 2023-05-02 ENCOUNTER — Other Ambulatory Visit: Payer: Self-pay | Admitting: Internal Medicine

## 2023-05-03 MED ORDER — CITALOPRAM HYDROBROMIDE 40 MG PO TABS: 40.0000 mg | ORAL_TABLET | Freq: Every day | ORAL | 0 refills | Status: DC

## 2023-05-04 ENCOUNTER — Ambulatory Visit (HOSPITAL_COMMUNITY)
Admission: RE | Admit: 2023-05-04 | Discharge: 2023-05-04 | Disposition: A | Discharge status: Home / Self Care | Payer: BC Managed Care – PPO | Source: Ambulatory Visit | Attending: Internal Medicine | Admitting: Internal Medicine

## 2023-05-04 ENCOUNTER — Encounter: Payer: Self-pay | Admitting: Internal Medicine

## 2023-05-04 ENCOUNTER — Encounter (HOSPITAL_COMMUNITY): Payer: Self-pay

## 2023-05-04 DIAGNOSIS — Z1231 Encounter for screening mammogram for malignant neoplasm of breast: Secondary | ICD-10-CM | POA: Diagnosis not present

## 2023-05-04 DIAGNOSIS — M85851 Other specified disorders of bone density and structure, right thigh: Secondary | ICD-10-CM | POA: Diagnosis not present

## 2023-05-04 DIAGNOSIS — M85852 Other specified disorders of bone density and structure, left thigh: Secondary | ICD-10-CM | POA: Diagnosis not present

## 2023-05-04 DIAGNOSIS — Z13820 Encounter for screening for osteoporosis: Secondary | ICD-10-CM | POA: Insufficient documentation

## 2023-05-05 ENCOUNTER — Other Ambulatory Visit: Payer: Self-pay

## 2023-05-05 MED ORDER — OMEPRAZOLE 40 MG PO CPDR: 40.0000 mg | DELAYED_RELEASE_CAPSULE | Freq: Every day | ORAL | 3 refills | Status: DC

## 2023-05-06 ENCOUNTER — Encounter: Payer: Self-pay | Admitting: Internal Medicine

## 2023-05-06 ENCOUNTER — Other Ambulatory Visit: Payer: Self-pay | Admitting: Internal Medicine

## 2023-05-06 DIAGNOSIS — G2581 Restless legs syndrome: Secondary | ICD-10-CM

## 2023-05-06 DIAGNOSIS — M62838 Other muscle spasm: Secondary | ICD-10-CM

## 2023-05-06 MED ORDER — CYCLOBENZAPRINE HCL 5 MG PO TABS
5.0000 mg | ORAL_TABLET | Freq: Two times a day (BID) | ORAL | 0 refills | Status: DC | PRN
Start: 2023-05-06 — End: 2023-11-18

## 2023-05-06 NOTE — Telephone Encounter (Signed)
Spoke with pt

## 2023-05-09 ENCOUNTER — Encounter: Payer: Self-pay | Admitting: Internal Medicine

## 2023-05-14 ENCOUNTER — Other Ambulatory Visit: Payer: Self-pay | Admitting: Internal Medicine

## 2023-05-16 ENCOUNTER — Other Ambulatory Visit: Payer: Self-pay | Admitting: Neurology

## 2023-05-16 DIAGNOSIS — E538 Deficiency of other specified B group vitamins: Secondary | ICD-10-CM

## 2023-05-23 ENCOUNTER — Encounter: Payer: Self-pay | Admitting: Internal Medicine

## 2023-05-23 ENCOUNTER — Other Ambulatory Visit: Payer: Self-pay | Admitting: Internal Medicine

## 2023-05-23 DIAGNOSIS — E669 Obesity, unspecified: Secondary | ICD-10-CM

## 2023-05-23 MED ORDER — SEMAGLUTIDE-WEIGHT MANAGEMENT 2.4 MG/0.75ML ~~LOC~~ SOAJ: 2.4000 mg | SUBCUTANEOUS | 2 refills | Status: AC

## 2023-05-24 ENCOUNTER — Ambulatory Visit (HOSPITAL_COMMUNITY)
Admission: RE | Admit: 2023-05-24 | Discharge: 2023-05-24 | Disposition: A | Discharge status: Home / Self Care | Payer: Medicare HMO | Source: Ambulatory Visit | Attending: Pulmonary Disease | Admitting: Pulmonary Disease

## 2023-05-24 DIAGNOSIS — R0602 Shortness of breath: Secondary | ICD-10-CM | POA: Diagnosis present

## 2023-05-24 DIAGNOSIS — J449 Chronic obstructive pulmonary disease, unspecified: Secondary | ICD-10-CM | POA: Diagnosis present

## 2023-05-24 LAB — PULMONARY FUNCTION TEST
DL/VA % pred: 102 %
DL/VA: 4.13 ml/min/mmHg/L
DLCO unc % pred: 97 %
DLCO unc: 22.53 ml/min/mmHg
FEF 25-75 Post: 1.79 L/s
FEF 25-75 Pre: 1.14 L/s
FEF2575-%Change-Post: 57 %
FEF2575-%Pred-Post: 73 %
FEF2575-%Pred-Pre: 46 %
FEV1-%Change-Post: 12 %
FEV1-%Pred-Post: 87 %
FEV1-%Pred-Pre: 78 %
FEV1-Post: 2.56 L
FEV1-Pre: 2.27 L
FEV1FVC-%Change-Post: 12 %
FEV1FVC-%Pred-Pre: 85 %
FEV6-%Change-Post: 3 %
FEV6-%Pred-Post: 92 %
FEV6-%Pred-Pre: 89 %
FEV6-Post: 3.38 L
FEV6-Pre: 3.26 L
FEV6FVC-%Change-Post: 3 %
FEV6FVC-%Pred-Post: 101 %
FEV6FVC-%Pred-Pre: 97 %
FVC-%Change-Post: 0 %
FVC-%Pred-Post: 91 %
FVC-%Pred-Pre: 91 %
FVC-Post: 3.47 L
FVC-Pre: 3.46 L
Post FEV1/FVC ratio: 74 %
Post FEV6/FVC ratio: 97 %
Pre FEV1/FVC ratio: 66 %
Pre FEV6/FVC Ratio: 94 %
RV % pred: 142 %
RV: 3.33 L
TLC % pred: 115 %
TLC: 6.69 L

## 2023-05-24 MED ORDER — ALBUTEROL SULFATE (2.5 MG/3ML) 0.083% IN NEBU
2.5000 mg | INHALATION_SOLUTION | Freq: Once | RESPIRATORY_TRACT | Status: AC
  Administered 2023-05-24: 2.5 mg via RESPIRATORY_TRACT

## 2023-05-25 ENCOUNTER — Other Ambulatory Visit: Payer: Self-pay | Admitting: Internal Medicine

## 2023-05-25 ENCOUNTER — Inpatient Hospital Stay
Admission: RE | Admit: 2023-05-25 | Discharge: 2023-05-25 | Disposition: A | Discharge status: Home / Self Care | Payer: Self-pay | Source: Ambulatory Visit | Attending: Internal Medicine | Admitting: Internal Medicine

## 2023-05-25 DIAGNOSIS — Z1231 Encounter for screening mammogram for malignant neoplasm of breast: Secondary | ICD-10-CM

## 2023-05-27 ENCOUNTER — Other Ambulatory Visit: Payer: Self-pay

## 2023-05-27 ENCOUNTER — Telehealth: Payer: Self-pay

## 2023-05-27 MED ORDER — TRELEGY ELLIPTA 100-62.5-25 MCG/ACT IN AEPB: 1.0000 | INHALATION_SPRAY | Freq: Every day | RESPIRATORY_TRACT | 1 refills | Status: DC

## 2023-05-27 NOTE — Telephone Encounter (Signed)
I left a message for the patient to return my call.

## 2023-05-27 NOTE — Telephone Encounter (Signed)
-----   Message from [place] V. Alva sent at 05/25/2023  9:28 AM EDT ----- Minimal airway obstruction lung function is maintained and 87%. She can cut down Trelegy to 100-after she is done with her current Diskus

## 2023-06-14 ENCOUNTER — Other Ambulatory Visit: Payer: Self-pay | Admitting: Internal Medicine

## 2023-06-17 ENCOUNTER — Encounter: Payer: Self-pay | Admitting: Internal Medicine

## 2023-06-17 DIAGNOSIS — G2581 Restless legs syndrome: Secondary | ICD-10-CM

## 2023-06-21 ENCOUNTER — Ambulatory Visit: Payer: BC Managed Care – PPO | Admitting: Pulmonary Disease

## 2023-06-23 ENCOUNTER — Encounter: Payer: Self-pay | Admitting: Adult Health

## 2023-06-23 ENCOUNTER — Ambulatory Visit: Payer: Medicare HMO | Admitting: Adult Health

## 2023-06-23 VITALS — BP 110/64 | HR 88 | Ht 69.0 in | Wt 178.0 lb

## 2023-06-23 DIAGNOSIS — Z87891 Personal history of nicotine dependence: Secondary | ICD-10-CM

## 2023-06-23 DIAGNOSIS — J449 Chronic obstructive pulmonary disease, unspecified: Secondary | ICD-10-CM

## 2023-06-23 DIAGNOSIS — G4733 Obstructive sleep apnea (adult) (pediatric): Secondary | ICD-10-CM | POA: Diagnosis not present

## 2023-06-23 NOTE — Telephone Encounter (Signed)
Dr. Vassie Loll  -Patient is interested in INSPIRE  HST shows moderate OSA 01/2023  Prev tried CPAP -intolerant   Can you set up for DISE procedure , if candidate will need referral to ENT   [facility] can you send to [facility] procedure pool for some reason it is blocking me from sending

## 2023-06-23 NOTE — Assessment & Plan Note (Signed)
Moderate to severe sleep apnea-we reviewed her recent sleep study results and went over in detail.  Patient has been CPAP intolerant.  She says she absolutely cannot try CPAP again.  Wants to look into alternative options.  We discussed the inspire device.  She would like to proceed with the inspire workup.  Will set patient up for drug-induced sleep endoscopy.  If a candidate then we will refer out to ENT for placement  Plan  Patient Instructions  Refer to Lung cancer CT chest screening program.  We will be in touch regarding DISE procedure with Dr. Vassie Loll.  Continue on Trelegy 1 puff daily , rinse after use  Albuterol inhaler As needed   Activity as tolerated.  Continue on Requip and Neurontin .  Follow up with Dr. Vassie Loll  or Geo Slone NP and As needed

## 2023-06-23 NOTE — Assessment & Plan Note (Signed)
Mild COPD currently maintained on Trelegy. Referred to the lung cancer CT screening program.  Patient remains very active.  Vaccines are up-to-date.  Plan  Patient Instructions  Refer to Lung cancer CT chest screening program.  We will be in touch regarding DISE procedure with Dr. Vassie Loll.  Continue on Trelegy 1 puff daily , rinse after use  Albuterol inhaler As needed   Activity as tolerated.  Continue on Requip and Neurontin .  Follow up with Dr. Vassie Loll  or Amparo Donalson NP and As needed

## 2023-06-23 NOTE — Progress Notes (Signed)
@Patient  ID: [name] [name], female    DOB: 1956-05-17, 67 y.o.   MRN: [id number]  Chief Complaint  Patient presents with   Follow-up    Referring provider: Billie Lade, MD  HPI: 67 year old female former smoker (40-year pack history) followed for COPD, obstructive sleep apnea and restless leg syndrome  TEST/EVENTS :  January 27 2023 HST showed moderate  OSA with AHI 29/ hr & low sat of 77%   PFTs May 24, 2023 showed mild airflow obstruction with FEV1 at 78%, ratio 66, FVC 91%, positive bronchodilator response, postbronchodilator FEV1 87%, ratio 74, FVC 91%, 12% bronchodilator change, DLCO 97%.   06/23/2023 Follow up ; COPD,. OSA , RLS  Patient returns for a month follow-up.  Patient has underlying COPD.  She says overall breathing is doing okay.  She remains very active.  She continues on Trelegy daily.  No increased albuterol use.  She did have PFTs earlier this month that showed mild airflow obstruction with reversibility.  We reviewed her PFTs in detail.  Patient is a former smoker.  Has a 40-year pack history.  We discussed the lung cancer CT screening program.  Patient has a history of sleep apnea.  Has tried CPAP in the past but was unable to tolerate.  She says she tried several different mask and pressure settings but says she absolutely cannot wear this.  She feels that it makes her very claustrophobic.  She was set up for a home sleep study that was done January 27, 2023 that showed moderate sleep apnea with AHI of 29/hour and SpO2 low at 77%.  We discussed her sleep study results.  Patient would like an alternative to CPAP.  We did discuss inspire device.  She is interested in proceeding in the workup for inspire.  Patient education was given.  Patient does have restless leg syndrome.  She remains on Requip and Neurontin.  She says that it does help but she continues to have daily symptoms.  We discussed that controlling her sleep apnea may help this as well.  She also is  treated for iron deficiency anemia.   Allergies  Allergen Reactions   Mobic [Meloxicam] Anaphylaxis   Buspirone Anxiety    Immunization History  Administered Date(s) Administered   Influenza,inj,Quad PF,6+ Mos 06/10/2021, 04/13/2022   Moderna SARS-COV2 Booster Vaccination 05/12/2022   Moderna Sars-Covid-2 Vaccination 11/19/2019   PFIZER Comirnaty(Gray Top)Covid-19 Tri-Sucrose Vaccine 03/11/2021   PFIZER(Purple Top)SARS-COV-2 Vaccination 10/28/2019, 11/18/2019, 06/25/2020, 06/25/2020   Tdap 07/26/2011, 11/27/2020   Zoster Recombinant(Shingrix) 10/14/2021, 04/06/2022    Past Medical History:  Diagnosis Date   Allergy Mobic   Arthritis    COPD (chronic obstructive pulmonary disease) (HCC)    Emphysema of lung (HCC)    GERD (gastroesophageal reflux disease)    Hard of hearing    Hyperlipidemia 04/13/2022   OCD (obsessive compulsive disorder)    Restless legs syndrome (RLS)    Screening for colorectal cancer 06/14/2022   Sleep apnea     Tobacco History: Social History   Tobacco Use  Smoking Status Former   Current packs/day: 0.00   Average packs/day: 1 pack/day for 15.0 years (15.0 ttl pk-yrs)   Types: Cigarettes, E-cigarettes   Start date: 03/23/1997   Quit date: 03/23/2012   Years since quitting: 11.2   Passive exposure: Never  Smokeless Tobacco Never   Counseling given: Not Answered   Outpatient Medications Prior to Visit  Medication Sig Dispense Refill   cyclobenzaprine (FLEXERIL) 5 MG tablet Take  1 tablet (5 mg total) by mouth 2 (two) times daily as needed. 15 tablet 0   albuterol (VENTOLIN HFA) 108 (90 Base) MCG/ACT inhaler INHALE 2 PUFFS INTO THE LUNGS EVERY 6 HOURS AS NEEDED FOR WHEEZING OR SHORTNESS OF BREATH 6.7 g 2   citalopram (CELEXA) 40 MG tablet Take 1 tablet (40 mg total) by mouth daily. 30 tablet 0   cyanocobalamin (VITAMIN B12) 1000 MCG/ML injection ONCE MONTHLY. 22 mL 0   Fluticasone-Umeclidin-Vilant (TRELEGY ELLIPTA) 100-62.5-25 MCG/ACT AEPB  Inhale 1 puff into the lungs daily. 1 each 1   gabapentin (NEURONTIN) 600 MG tablet TAKE 1 TABLET BY MOUTH THREE TIMES DAILY AND 2 TABLETS BY MOUTH EVERY EVENING 150 tablet 0   HYDROcodone-acetaminophen (NORCO) 5-325 MG tablet Take 1 tablet by mouth every 6 (six) hours as needed for moderate pain. 16 tablet 0   Magnesium 500 MG TABS Take 500 mg by mouth daily.     NEEDLE, DISP, 23 G (BD DISP NEEDLE) 23G X 1" MISC Needle used for B12 IM injections. 22 each 0   omeprazole (PRILOSEC) 40 MG capsule Take 1 capsule (40 mg total) by mouth daily. 90 capsule 3   rOPINIRole (REQUIP) 4 MG tablet Take 1 tablet (4 mg total) by mouth at bedtime. 90 tablet 3   rosuvastatin (CRESTOR) 5 MG tablet Take 1 tablet (5 mg total) by mouth daily. 90 tablet 3   Semaglutide-Weight Management 2.4 MG/0.75ML SOAJ Inject 2.4 mg into the skin once a week. 6 mL 2   SYRINGE-NEEDLE, DISP, 3 ML (B-D 3CC LUER-LOK SYR 18GX1-1/2) 18G X 1-1/2" 3 ML MISC Syringe/needle to draw up B12 injections. 22 each 0   TRELEGY ELLIPTA 200-62.5-25 MCG/ACT AEPB INHALE 1 PUFF INTO THE LUNGS DAILY 60 each 5   No facility-administered medications prior to visit.     Review of Systems:   Constitutional:   No  weight loss, night sweats,  Fevers, chills, fatigue, or  lassitude.  HEENT:   No headaches,  Difficulty swallowing,  Tooth/dental problems, or  Sore throat,                No sneezing, itching, ear ache, nasal congestion, post nasal drip,   CV:  No chest pain,  Orthopnea, PND, swelling in lower extremities, anasarca, dizziness, palpitations, syncope.   GI  No heartburn, indigestion, abdominal pain, nausea, vomiting, diarrhea, change in bowel habits, loss of appetite, bloody stools.   Resp: No chest wall deformity  Skin: no rash or lesions.  GU: no dysuria, change in color of urine, no urgency or frequency.  No flank pain, no hematuria   MS:  No joint pain or swelling.  No decreased range of motion.  No back pain.    Physical  Exam  BP 110/64   Pulse 88   Ht 5\' 9"  (1.753 m)   Wt 178 lb (80.7 kg)   SpO2 94% Comment: RA  BMI 26.29 kg/m   GEN: A/Ox3; pleasant , NAD, well nourished    HEENT:  [place]/AT,  NOSE-clear, THROAT-clear, no lesions, no postnasal drip or exudate noted.   NECK:  Supple w/ fair ROM; no JVD; normal carotid impulses w/o bruits; no thyromegaly or nodules palpated; no lymphadenopathy.    RESP  Clear  P & A; w/o, wheezes/ rales/ or rhonchi. no accessory muscle use, no dullness to percussion  CARD:  RRR, no m/r/g, no peripheral edema, pulses intact, no cyanosis or clubbing.  GI:   Soft & nt; nml bowel sounds;  no organomegaly or masses detected.   Musco: Warm bil, no deformities or joint swelling noted.   Neuro: alert, no focal deficits noted.    Skin: Warm, no lesions or rashes    Lab Results:  CBC    Component Value Date/Time   WBC 5.6 04/26/2023 1132   RBC 4.11 04/26/2023 1132   HGB 12.4 04/26/2023 1132   HCT 37.0 04/26/2023 1132   PLT 196 04/26/2023 1132   MCV 90 04/26/2023 1132   MCH 30.2 04/26/2023 1132   MCHC 33.5 04/26/2023 1132   RDW 12.5 04/26/2023 1132   LYMPHSABS 1.9 04/26/2023 1132   EOSABS 0.1 04/26/2023 1132   BASOSABS 0.0 04/26/2023 1132    BMET    Component Value Date/Time   NA 143 04/26/2023 1132   K 4.4 04/26/2023 1132   CL 105 04/26/2023 1132   CO2 25 04/26/2023 1132   GLUCOSE 89 04/26/2023 1132   BUN 11 04/26/2023 1132   CREATININE 0.87 04/26/2023 1132   CALCIUM 9.1 04/26/2023 1132    BNP No results found for: "BNP"  ProBNP No results found for: "PROBNP"  Imaging: No results found.  Administration History     None          Latest Ref Rng & Units 05/24/2023    2:56 PM  PFT Results  FVC-Pre L 3.46   FVC-Predicted Pre % 91   FVC-Post L 3.47   FVC-Predicted Post % 91   Pre FEV1/FVC % % 66   Post FEV1/FCV % % 74   FEV1-Pre L 2.27   FEV1-Predicted Pre % 78   FEV1-Post L 2.56   DLCO uncorrected ml/min/mmHg 22.53   DLCO UNC% %  97   DLVA Predicted % 102   TLC L 6.69   TLC % Predicted % 115   RV % Predicted % 142     No results found for: "NITRICOXIDE"      Assessment & Plan:   OSA (obstructive sleep apnea) Moderate to severe sleep apnea-we reviewed her recent sleep study results and went over in detail.  Patient has been CPAP intolerant.  She says she absolutely cannot try CPAP again.  Wants to look into alternative options.  We discussed the inspire device.  She would like to proceed with the inspire workup.  Will set patient up for drug-induced sleep endoscopy.  If a candidate then we will refer out to ENT for placement  Plan  Patient Instructions  Refer to Lung cancer CT chest screening program.  We will be in touch regarding DISE procedure with Dr. Vassie Loll.  Continue on Trelegy 1 puff daily , rinse after use  Albuterol inhaler As needed   Activity as tolerated.  Continue on Requip and Neurontin .  Follow up with Dr. Vassie Loll  or Stein Windhorst NP and As needed        COPD (chronic obstructive pulmonary disease) (HCC) Mild COPD currently maintained on Trelegy. Referred to the lung cancer CT screening program.  Patient remains very active.  Vaccines are up-to-date.  Plan  Patient Instructions  Refer to Lung cancer CT chest screening program.  We will be in touch regarding DISE procedure with Dr. Vassie Loll.  Continue on Trelegy 1 puff daily , rinse after use  Albuterol inhaler As needed   Activity as tolerated.  Continue on Requip and Neurontin .  Follow up with Dr. Vassie Loll  or Tracey Hermance NP and As needed          Rubye Oaks, NP 06/23/2023

## 2023-06-23 NOTE — Patient Instructions (Addendum)
Refer to Lung cancer CT chest screening program.  We will be in touch regarding DISE procedure with Dr. Vassie Loll.  Continue on Trelegy 1 puff daily , rinse after use  Albuterol inhaler As needed   Activity as tolerated.  Continue on Requip and Neurontin .  Follow up with Dr. Vassie Loll  or Rosangela Fehrenbach NP and As needed

## 2023-06-24 ENCOUNTER — Encounter: Payer: Self-pay | Admitting: Internal Medicine

## 2023-06-24 ENCOUNTER — Telehealth (HOSPITAL_BASED_OUTPATIENT_CLINIC_OR_DEPARTMENT_OTHER): Payer: Self-pay | Admitting: Pulmonary Disease

## 2023-06-24 DIAGNOSIS — L03039 Cellulitis of unspecified toe: Secondary | ICD-10-CM | POA: Diagnosis not present

## 2023-06-24 MED ORDER — TRAMADOL HCL ER 100 MG PO TB24: 100.0000 mg | ORAL_TABLET | Freq: Every evening | ORAL | 0 refills | Status: DC | PRN

## 2023-06-24 NOTE — Telephone Encounter (Signed)
Called Ms. Gilani to discuss poorly controlled symptoms of RLS.  Symptoms have remained poorly controlled despite increasing doses of ropinirole and gabapentin.  We previously discussed trialing tramadol 50 mg nightly as needed for severe symptom relief.  A prescription was sent in January 2024.  She states that this was initially ineffective.  For some reason, her prescription was being filled by her pharmacy as 7 tablets at a time.  She reports requesting a refill through her pharmacy in June.  7 additional 50 mg tablets were filled on 01/25/2023.  She did not take this medication initially and stored it in her cabinet.  Most recently, RLS symptoms have worsened.  She tried taking 50 mg nightly initially, but it was ineffective.  She tried increasing to 100 mg, which greatly alleviated her symptoms and allowed her to sleep.  She would like to continue tramadol at 100 mg nightly as needed for severe symptom relief.  I sent a prescription for tramadol 100 mg nightly as needed for severe symptom relief x 30 tablets to her pharmacy.  I reviewed with Ms. Honeywell that any future deviations from taking her medication as prescribed will result in discontinuation of the controlled substance.  She expresses understanding.  I requested a follow-up appointment for December to review her symptoms.  We will check UDS at that time.  She is in agreement with this plan.

## 2023-06-24 NOTE — Telephone Encounter (Signed)
I am available to do DISE procedure the week of December 2 to 6 She will have to stop semaglutide 1 week prior to procedure Please schedule if patient is willing to proceed

## 2023-06-24 NOTE — Addendum Note (Signed)
Addended by: Christel Mormon E on: 06/24/2023 12:37 PM   Modules accepted: Orders

## 2023-06-27 NOTE — Telephone Encounter (Signed)
DISE scheduled for 07/26/23 at 7:30 AM.  Pt given appt info & letter prepared.

## 2023-06-29 ENCOUNTER — Encounter: Payer: Self-pay | Admitting: Orthopedic Surgery

## 2023-06-29 ENCOUNTER — Telehealth: Payer: Self-pay | Admitting: Orthopedic Surgery

## 2023-06-29 ENCOUNTER — Other Ambulatory Visit: Payer: Self-pay

## 2023-06-29 ENCOUNTER — Ambulatory Visit: Payer: Medicare HMO | Admitting: Orthopedic Surgery

## 2023-06-29 VITALS — BP 121/71 | HR 90 | Ht 69.0 in | Wt 167.0 lb

## 2023-06-29 DIAGNOSIS — M21372 Foot drop, left foot: Secondary | ICD-10-CM | POA: Diagnosis not present

## 2023-06-29 NOTE — Telephone Encounter (Signed)
Ok to fax  Thanks!

## 2023-06-29 NOTE — Progress Notes (Signed)
[facility] Return  Assessment: [name] [name] is a 67 y.o. female with the following: Left foot drop; acute, no injury  Plan: Mrs. Shanker has a foot drop on the left foot.  No injury.  Acute onset, approximate 1 week ago.  She is not a diabetic.  She has no pain in the lower back.  No radiating pains.  No Tinel's around the fibular head, or the anterior compartment.  Decreased sensation to the dorsum of the foot.  She is unable to dorsiflex the TA in the EHL.  Unclear what is causing her problems.  I would like to obtain EMG of the left lower extremity.  We will refer her to Dr. Alvester Morin.  In addition, we have given her a prescription to obtain an AFO.   Follow-up: Return for After EMG.   Subjective:  Chief Complaint  Patient presents with   Foot Problem    Left foot drop/ denies any injury no back pain    History of Present Illness: [name] [name] is a 67 y.o. female who returns to clinic for evaluation of left foot pain.  She states that approxi-1 week ago, she noted that she started to have some numbness and tingling to the dorsum of the foot.  She has also had an inability to dorsiflex the ankle and the great toe.  No issues in the lower back.  She does not have any pain currently.  Occasional sciatica, but this is not bothering her currently.  No injury around the knee.  She takes gabapentin.  She states that shortly after a big stretch it yoga, she started noticed these problems.  Review of Systems: No fevers or chills No numbness or tingling No chest pain No shortness of breath No bowel or bladder dysfunction No GI distress No headaches   Objective: BP 121/71   Pulse 90   Ht 5\' 9"  (1.753 m)   Wt 167 lb (75.8 kg)   BMI 24.66 kg/m   Physical Exam:  Alert and oriented.  No acute distress.  High-stepping gait with the left foot.  Evaluation of left lower leg demonstrates no bruising.  No redness.  No swelling.  No tenderness to palpation within the popliteal  fossa.  Negative Tinel's over the fibular head and the anterior compartment.  She is unable to dorsiflex the ankle, over the great toe.  Decree sensation to the dorsum of the foot, as well as within the first webspace.  Toes are warm and well-perfused.  IMAGING: I personally ordered and reviewed the following images:  No new imaging obtained today.  Oliver Barre, MD 06/29/2023 10:43 AM

## 2023-06-29 NOTE — Telephone Encounter (Signed)
Dr. Dallas Schimke pt [name] [name] w/[facility] [phone] lvm stating that they received the face sheet and request, but they need the office visit notes pertaining to the request.  She is requesting they be faxed to [phone].

## 2023-07-01 ENCOUNTER — Encounter: Payer: Self-pay | Admitting: Internal Medicine

## 2023-07-09 ENCOUNTER — Other Ambulatory Visit: Payer: Self-pay | Admitting: Internal Medicine

## 2023-07-25 ENCOUNTER — Telehealth: Payer: Self-pay | Admitting: Pulmonary Disease

## 2023-07-25 ENCOUNTER — Telehealth: Payer: Self-pay

## 2023-07-26 ENCOUNTER — Telehealth: Payer: Self-pay

## 2023-07-26 ENCOUNTER — Other Ambulatory Visit: Payer: Self-pay | Admitting: Internal Medicine

## 2023-07-26 ENCOUNTER — Encounter (HOSPITAL_COMMUNITY): Admission: RE | Payer: Self-pay | Source: Home / Self Care

## 2023-07-26 ENCOUNTER — Ambulatory Visit (HOSPITAL_COMMUNITY): Admission: RE | Admit: 2023-07-26 | Payer: Medicare HMO | Source: Home / Self Care | Admitting: Pulmonary Disease

## 2023-07-26 SURGERY — DRUG INDUCED ENDOSCOPY
Anesthesia: Monitor Anesthesia Care

## 2023-07-26 NOTE — Telephone Encounter (Signed)
Returned call. LVM to call staff and complete LCS.

## 2023-07-26 NOTE — Telephone Encounter (Signed)
I have requested a new available date from [facility] for after the first of the year.

## 2023-07-27 NOTE — Telephone Encounter (Signed)
Pt has been rescheduled 1/23 @ 1 PM, check in by 12 PM.  New letter created & pt was notified.

## 2023-08-01 ENCOUNTER — Telehealth: Payer: Self-pay | Admitting: Orthopedic Surgery

## 2023-08-01 NOTE — Telephone Encounter (Signed)
DR. Domingo Madeira with [facility] has called  for [facility] Busing they faxed a form over for Dr. Dallas Schimke to sign they faxed the form over on 07/18/23 has not received it.  Please call Melissa back at [phone]

## 2023-08-03 ENCOUNTER — Telehealth: Payer: Self-pay | Admitting: Orthopedic Surgery

## 2023-08-03 ENCOUNTER — Ambulatory Visit: Payer: Medicare HMO | Admitting: Physical Medicine and Rehabilitation

## 2023-08-03 DIAGNOSIS — R531 Weakness: Secondary | ICD-10-CM

## 2023-08-03 DIAGNOSIS — M79605 Pain in left leg: Secondary | ICD-10-CM | POA: Diagnosis not present

## 2023-08-03 DIAGNOSIS — R202 Paresthesia of skin: Secondary | ICD-10-CM

## 2023-08-03 DIAGNOSIS — M21372 Foot drop, left foot: Secondary | ICD-10-CM

## 2023-08-03 NOTE — Telephone Encounter (Signed)
Called to see if form has been received back. Staff stated nothing was seen, asked for the form to be resent.

## 2023-08-03 NOTE — Telephone Encounter (Signed)
Dr. Dallas Schimke pt - Melissa w/[facility] lvm stating she was following up on some paperwork that was sent for Dr. Dallas Schimke.  [phone]

## 2023-08-04 NOTE — Procedures (Unsigned)
EMG & NCV Findings: Evaluation of the left fibular motor and the left tibial motor nerves showed reduced amplitude (L2.1, L2.5 mV).  All remaining nerves (as indicated in the following tables) were within normal limits.    Needle evaluation of the left anterior tibialis muscle showed increased insertional activity and widespread spontaneous activity.  The left Fibularis Longus muscle showed increased insertional activity and moderately increased spontaneous activity.  All remaining muscles (as indicated in the following table) showed no evidence of electrical instability.    Impression: The above electrodiagnostic study is ABNORMAL and reveals evidence of severe chronic L4 radiculopathy on the left.  There is no conduction velocity or amplitude drop across the fibular head.  There is no significant electrodiagnostic evidence of any other focal nerve entrapment, lumbosacral plexopathy or generalized peripheral neuropathy.   Recommendations: 1.  Follow-up with referring physician. 2.  Continue current management of symptoms. 3.  Suggest MRI and surgical evaluation.  ___________________________ [name] [name] FAAPMR Board Certified, American Board of Physical Medicine and Rehabilitation    Nerve Conduction Studies Anti Sensory Summary Table   Stim Site NR Peak (ms) Norm Peak (ms) P-T Amp (V) Norm P-T Amp Site1 Site2 Delta-P (ms) Dist (cm) Vel (m/s) Norm Vel (m/s)  Left Sup Fibular Anti Sensory (Ant Lat Mall)  29.5C  14 cm    3.8 <4.4 6.1 >5.0 14 cm Ant Lat Mall 3.8 14.0 37 >32   Motor Summary Table   Stim Site NR Onset (ms) Norm Onset (ms) O-P Amp (mV) Norm O-P Amp Site1 Site2 Delta-0 (ms) Dist (cm) Vel (m/s) Norm Vel (m/s)  Left Fibular Motor (Ext Dig Brev)  28.4C  Ankle    4.8 <6.1 *2.1 >2.5 B Fib Ankle 7.2 30.0 42 >38  B Fib    12.0  1.6  Poplt B Fib 2.3 10.0 43 >40  Poplt    14.3  1.3         Left Tibial Motor (Abd Hall Brev)  29.6C  Ankle    5.0 <6.1 *2.5 >3.0 Knee Ankle 9.0  36.0 40 >35  Knee    14.0  2.0          EMG   Side Muscle Nerve Root Ins Act Fibs Psw Amp Dur Poly Recrt Int Dennie Bible Comment  Left AntTibialis Dp Br Peron L4-5 *Incr *4+ *4+ Nml Nml 0 Nml Nml   Left Fibularis Longus  Sup Br Peron L5-S1 *Incr *2+ *2+ Nml Nml 0 Nml Nml   Left MedGastroc Tibial S1-2 Nml Nml Nml Nml Nml 0 Nml Nml   Left VastusMed Femoral L2-4 Nml Nml Nml Nml Nml 0 Nml Nml   Left BicepsFemS Sciatic L5-S1 Nml Nml Nml Nml Nml 0 Nml Nml     Nerve Conduction Studies Anti Sensory Left/Right Comparison   Stim Site L Lat (ms) R Lat (ms) L-R Lat (ms) L Amp (V) R Amp (V) L-R Amp (%) Site1 Site2 L Vel (m/s) R Vel (m/s) L-R Vel (m/s)  Sup Fibular Anti Sensory (Ant Lat Mall)  29.5C  14 cm 3.8   6.1   14 cm Ant Lat Mall 37     Motor Left/Right Comparison   Stim Site L Lat (ms) R Lat (ms) L-R Lat (ms) L Amp (mV) R Amp (mV) L-R Amp (%) Site1 Site2 L Vel (m/s) R Vel (m/s) L-R Vel (m/s)  Fibular Motor (Ext Dig Brev)  28.4C  Ankle 4.8   *2.1   B Fib Ankle 42    B  Fib 12.0   1.6   Poplt B Fib 43    Poplt 14.3   1.3         Tibial Motor (Abd Hall Brev)  29.6C  Ankle 5.0   *2.5   Knee Ankle 40    Knee 14.0   2.0            Waveforms:

## 2023-08-07 ENCOUNTER — Encounter: Payer: Self-pay | Admitting: Physical Medicine and Rehabilitation

## 2023-08-07 NOTE — Progress Notes (Unsigned)
[name] [name] - 67 y.o. female MRN [id number]  Date of birth: [birth year]  Office Visit Note: Visit Date: 08/03/2023 PCP: Billie Lade, MD Referred by: Oliver Barre, MD  Subjective: Chief Complaint  Patient presents with   Left Leg - Pain   HPI: [name] [name] is a 67 y.o. female who comes in todayHPI   ROS Otherwise per HPI.  Assessment & Plan: Visit Diagnoses:    ICD-10-CM   1. Paresthesia of skin  R20.2 NCV with EMG (electromyography)    2. Left foot drop  M21.372     3. Pain in left leg  M79.605     4. Weakness  R53.1        Plan: Impression: The above electrodiagnostic study is ABNORMAL and reveals evidence of severe chronic L4 radiculopathy on the left.  There is no conduction velocity or amplitude drop across the fibular head.  There is no significant electrodiagnostic evidence of any other focal nerve entrapment, lumbosacral plexopathy or generalized peripheral neuropathy.   Recommendations: 1.  Follow-up with referring physician. 2.  Continue current management of symptoms. 3.  Suggest MRI and surgical evaluation.  Meds & Orders: No orders of the defined types were placed in this encounter.   Orders Placed This Encounter  Procedures   NCV with EMG (electromyography)    Follow-up: Return for Thane Edu, MD.   Procedures: No procedures performed  EMG & NCV Findings: Evaluation of the left fibular motor and the left tibial motor nerves showed reduced amplitude (L2.1, L2.5 mV).  All remaining nerves (as indicated in the following tables) were within normal limits.    Needle evaluation of the left anterior tibialis muscle showed increased insertional activity and widespread spontaneous activity.  The left Fibularis Longus muscle showed increased insertional activity and moderately increased spontaneous activity.  All remaining muscles (as indicated in the following table) showed no evidence of electrical instability.    Impression: The above  electrodiagnostic study is ABNORMAL and reveals evidence of severe chronic L4 radiculopathy on the left.  There is no conduction velocity or amplitude drop across the fibular head.  There is no significant electrodiagnostic evidence of any other focal nerve entrapment, lumbosacral plexopathy or generalized peripheral neuropathy.   Recommendations: 1.  Follow-up with referring physician. 2.  Continue current management of symptoms. 3.  Suggest MRI and surgical evaluation.  ___________________________ Naaman Plummer FAAPMR Board Certified, American Board of Physical Medicine and Rehabilitation    Nerve Conduction Studies Anti Sensory Summary Table   Stim Site NR Peak (ms) Norm Peak (ms) P-T Amp (V) Norm P-T Amp Site1 Site2 Delta-P (ms) Dist (cm) Vel (m/s) Norm Vel (m/s)  Left Sup Fibular Anti Sensory (Ant Lat Mall)  29.5C  14 cm    3.8 <4.4 6.1 >5.0 14 cm Ant Lat Mall 3.8 14.0 37 >32   Motor Summary Table   Stim Site NR Onset (ms) Norm Onset (ms) O-P Amp (mV) Norm O-P Amp Site1 Site2 Delta-0 (ms) Dist (cm) Vel (m/s) Norm Vel (m/s)  Left Fibular Motor (Ext Dig Brev)  28.4C  Ankle    4.8 <6.1 *2.1 >2.5 B Fib Ankle 7.2 30.0 42 >38  B Fib    12.0  1.6  Poplt B Fib 2.3 10.0 43 >40  Poplt    14.3  1.3         Left Tibial Motor (Abd Hall Brev)  29.6C  Ankle    5.0 <6.1 *2.5 >3.0 Knee Ankle 9.0 36.0 40 >  35  Knee    14.0  2.0          EMG   Side Muscle Nerve Root Ins Act Fibs Psw Amp Dur Poly Recrt Int Dennie Bible Comment  Left AntTibialis Dp Br Peron L4-5 *Incr *4+ *4+ Nml Nml 0 Nml Nml   Left Fibularis Longus  Sup Br Peron L5-S1 *Incr *2+ *2+ Nml Nml 0 Nml Nml   Left MedGastroc Tibial S1-2 Nml Nml Nml Nml Nml 0 Nml Nml   Left VastusMed Femoral L2-4 Nml Nml Nml Nml Nml 0 Nml Nml   Left BicepsFemS Sciatic L5-S1 Nml Nml Nml Nml Nml 0 Nml Nml     Nerve Conduction Studies Anti Sensory Left/Right Comparison   Stim Site L Lat (ms) R Lat (ms) L-R Lat (ms) L Amp (V) R Amp (V) L-R Amp (%) Site1  Site2 L Vel (m/s) R Vel (m/s) L-R Vel (m/s)  Sup Fibular Anti Sensory (Ant Lat Mall)  29.5C  14 cm 3.8   6.1   14 cm Ant Lat Mall 37     Motor Left/Right Comparison   Stim Site L Lat (ms) R Lat (ms) L-R Lat (ms) L Amp (mV) R Amp (mV) L-R Amp (%) Site1 Site2 L Vel (m/s) R Vel (m/s) L-R Vel (m/s)  Fibular Motor (Ext Dig Brev)  28.4C  Ankle 4.8   *2.1   B Fib Ankle 42    B Fib 12.0   1.6   Poplt B Fib 43    Poplt 14.3   1.3         Tibial Motor (Abd Hall Brev)  29.6C  Ankle 5.0   *2.5   Knee Ankle 40    Knee 14.0   2.0            Waveforms:        Clinical History: No specialty comments available.   She reports that she quit smoking about 11 years ago. Her smoking use included cigarettes and e-cigarettes. She started smoking about 26 years ago. She has a 15 pack-year smoking history. She has never been exposed to tobacco smoke. She has never used smokeless tobacco.  Recent Labs    04/26/23 1132  HGBA1C 5.5    Objective:  VS:  HT:    WT:   BMI:     BP:   HR: bpm  TEMP: ( )  RESP:  Physical Exam  Ortho Exam  Imaging: No results found.  Past Medical/Family/Surgical/Social History: Medications & Allergies reviewed per EMR, new medications updated. Patient Active Problem List   Diagnosis Date Noted   GERD (gastroesophageal reflux disease) 04/26/2023   Breast cancer screening 04/26/2023   Osteoporosis screening 04/26/2023   Need for influenza vaccination 04/26/2023   Diarrhea due to malabsorption 12/10/2022   Abdominal pain, chronic, right upper quadrant 10/28/2022   Iron deficiency 08/30/2022   Positive colorectal cancer screening using Cologuard test 08/05/2022   OSA (obstructive sleep apnea) 06/21/2022   Screening for colorectal cancer 06/14/2022   Left elbow pain 05/14/2022   COPD (chronic obstructive pulmonary disease) (HCC) 04/13/2022   Hyperlipidemia 04/13/2022   Intolerance to fatty food 03/16/2022   Nausea & vomiting 03/16/2022   Colicky RUQ abdominal  pain 03/16/2022   Vitamin B 12 deficiency 01/11/2022   Anemia 01/11/2022   Restless leg syndrome 10/09/2021   Hyperreflexia 10/09/2021   Sensorineural hearing loss (SNHL) of both ears 10/09/2021   Anxiety 08/11/2021   Acquired deformity of joint of finger of right hand  06/16/2021   Acquired deformity of joint of finger of left hand 06/16/2021   Hx of non anemic vitamin B12 deficiency 06/16/2021   Restless leg 06/16/2021   Vitamin D deficiency 03/15/2021   Preventative health care 03/15/2021   Hx of sleep apnea 03/15/2021   Magnesium deficiency 03/15/2021   Past Medical History:  Diagnosis Date   Allergy Mobic   Arthritis    COPD (chronic obstructive pulmonary disease) (HCC)    Emphysema of lung (HCC)    GERD (gastroesophageal reflux disease)    Hard of hearing    Hyperlipidemia 04/13/2022   OCD (obsessive compulsive disorder)    Restless legs syndrome (RLS)    Screening for colorectal cancer 06/14/2022   Sleep apnea    Family History  Problem Relation Age of Onset   Alzheimer's disease Mother    Arthritis Mother    Cancer Mother    Heart attack Father    Heart disease Father    Diabetes Brother    Cancer Brother        possibly kidney   Diabetes Paternal Grandmother    Colon cancer Neg Hx    Colon polyps Neg Hx    Past Surgical History:  Procedure Laterality Date   ABDOMINAL HYSTERECTOMY     BIOPSY  04/22/2022   Procedure: BIOPSY;  Surgeon: Dolores Frame, MD;  Location: [facility];  Service: Gastroenterology;;   BREAST EXCISIONAL BIOPSY Right 1980   benign   CESAREAN SECTION  1996   CHOLECYSTECTOMY  10/2022   COLONOSCOPY WITH PROPOFOL N/A 10/08/2022   Procedure: COLONOSCOPY WITH PROPOFOL;  Surgeon: Dolores Frame, MD;  Location: [facility];  Service: Gastroenterology;  Laterality: N/A;  8:15 am, LM to see if pt can come earlier   ESOPHAGOGASTRODUODENOSCOPY (EGD) WITH PROPOFOL N/A 04/22/2022   Procedure: ESOPHAGOGASTRODUODENOSCOPY  (EGD) WITH PROPOFOL;  Surgeon: Dolores Frame, MD;  Location: [facility];  Service: Gastroenterology;  Laterality: N/A;  205 ASA 2   FRACTURE SURGERY     foot   JOINT REPLACEMENT     left shoulder   JOINT REPLACEMENT     right shoulder   POLYPECTOMY  10/08/2022   Procedure: POLYPECTOMY;  Surgeon: Dolores Frame, MD;  Location: [facility];  Service: Gastroenterology;;   Social History   Occupational History   Occupation: Sales Rep  Tobacco Use   Smoking status: Former    Current packs/day: 0.00    Average packs/day: 1 pack/day for 15.0 years (15.0 ttl pk-yrs)    Types: Cigarettes, E-cigarettes    Start date: 03/23/1997    Quit date: 03/23/2012    Years since quitting: 11.3    Passive exposure: Never   Smokeless tobacco: Never  Vaping Use   Vaping status: Never Used  Substance and Sexual Activity   Alcohol use: Yes    Comment: It's not a weekly occurance   Drug use: Yes    Types: Marijuana   Sexual activity: Not on file

## 2023-08-10 ENCOUNTER — Other Ambulatory Visit: Payer: Self-pay | Admitting: Pulmonary Disease

## 2023-08-11 NOTE — Telephone Encounter (Signed)
error 

## 2023-08-12 ENCOUNTER — Other Ambulatory Visit: Payer: Self-pay | Admitting: Internal Medicine

## 2023-08-13 ENCOUNTER — Other Ambulatory Visit: Payer: Self-pay | Admitting: Internal Medicine

## 2023-08-15 ENCOUNTER — Telehealth: Payer: Self-pay | Admitting: Orthopedic Surgery

## 2023-08-15 NOTE — Telephone Encounter (Signed)
Called her to see what her question is. This is [facility] phone number  I do not know what is status but there was a message sent on 12/11 asking about paperwork for the brace  What do I need to tell [facility]? I may not be able to call them until Thursday, I am in clinic, if her need is urgent, let me know I can take a short lunch maybe.

## 2023-08-15 NOTE — Telephone Encounter (Signed)
DR. Dallas Schimke Patient called and has questions about her brace.   Please call her back at [phone]

## 2023-08-15 NOTE — Telephone Encounter (Signed)
Thanks. I called they are closed and I did leave message with the answering service to call back with a need

## 2023-09-06 ENCOUNTER — Other Ambulatory Visit: Payer: Self-pay | Admitting: Internal Medicine

## 2023-09-06 DIAGNOSIS — G2581 Restless legs syndrome: Secondary | ICD-10-CM

## 2023-09-09 ENCOUNTER — Other Ambulatory Visit: Payer: Self-pay | Admitting: Internal Medicine

## 2023-09-15 ENCOUNTER — Telehealth (INDEPENDENT_AMBULATORY_CARE_PROVIDER_SITE_OTHER): Payer: Self-pay | Admitting: Otolaryngology

## 2023-09-15 ENCOUNTER — Ambulatory Visit (HOSPITAL_COMMUNITY)
Admission: RE | Admit: 2023-09-15 | Discharge: 2023-09-15 | Disposition: A | Discharge status: Home / Self Care | Payer: Medicare HMO | Attending: Pulmonary Disease | Admitting: Pulmonary Disease

## 2023-09-15 ENCOUNTER — Telehealth: Payer: Self-pay | Admitting: Pulmonary Disease

## 2023-09-15 ENCOUNTER — Ambulatory Visit (HOSPITAL_COMMUNITY): Payer: Medicare HMO | Admitting: Anesthesiology

## 2023-09-15 ENCOUNTER — Encounter (HOSPITAL_COMMUNITY)
Admission: RE | Disposition: A | Discharge status: Home / Self Care | Payer: Self-pay | Source: Home / Self Care | Attending: Pulmonary Disease

## 2023-09-15 DIAGNOSIS — G4730 Sleep apnea, unspecified: Secondary | ICD-10-CM | POA: Diagnosis not present

## 2023-09-15 DIAGNOSIS — D509 Iron deficiency anemia, unspecified: Secondary | ICD-10-CM | POA: Diagnosis not present

## 2023-09-15 DIAGNOSIS — K219 Gastro-esophageal reflux disease without esophagitis: Secondary | ICD-10-CM | POA: Diagnosis not present

## 2023-09-15 DIAGNOSIS — G4733 Obstructive sleep apnea (adult) (pediatric): Secondary | ICD-10-CM | POA: Insufficient documentation

## 2023-09-15 DIAGNOSIS — F419 Anxiety disorder, unspecified: Secondary | ICD-10-CM | POA: Insufficient documentation

## 2023-09-15 DIAGNOSIS — Z87891 Personal history of nicotine dependence: Secondary | ICD-10-CM | POA: Insufficient documentation

## 2023-09-15 DIAGNOSIS — Z79899 Other long term (current) drug therapy: Secondary | ICD-10-CM | POA: Diagnosis not present

## 2023-09-15 DIAGNOSIS — G2581 Restless legs syndrome: Secondary | ICD-10-CM | POA: Insufficient documentation

## 2023-09-15 DIAGNOSIS — J439 Emphysema, unspecified: Secondary | ICD-10-CM | POA: Insufficient documentation

## 2023-09-15 HISTORY — PX: DRUG INDUCED ENDOSCOPY: SHX6808

## 2023-09-15 SURGERY — DRUG INDUCED SLEEP ENDOSCOPY
Anesthesia: Monitor Anesthesia Care

## 2023-09-15 MED ORDER — SODIUM CHLORIDE 0.9 % IV SOLN: INTRAVENOUS | Status: DC | PRN

## 2023-09-15 MED ORDER — OXYMETAZOLINE HCL 0.05 % NA SOLN
1.0000 | Freq: Two times a day (BID) | NASAL | Status: DC
  Administered 2023-09-15: 1 via NASAL

## 2023-09-15 MED ORDER — PROPOFOL 500 MG/50ML IV EMUL
INTRAVENOUS | Status: DC | PRN
  Administered 2023-09-15: 65 ug/kg/min via INTRAVENOUS

## 2023-09-15 MED ORDER — PROPOFOL 10 MG/ML IV BOLUS
INTRAVENOUS | Status: DC | PRN
  Administered 2023-09-15: 40 mg via INTRAVENOUS
  Administered 2023-09-15: 20 mg via INTRAVENOUS

## 2023-09-15 MED ORDER — PHENYLEPHRINE HCL 0.25 % NA SOLN: 2.0000 | Freq: Once | NASAL | Status: DC

## 2023-09-15 MED ORDER — OXYMETAZOLINE HCL 0.05 % NA SOLN
NASAL | Status: DC | PRN
  Administered 2023-09-15: 2

## 2023-09-15 MED ORDER — ONDANSETRON HCL 4 MG/2ML IJ SOLN
INTRAMUSCULAR | Status: DC | PRN
  Administered 2023-09-15: 4 mg via INTRAVENOUS

## 2023-09-15 MED ORDER — OXYMETAZOLINE HCL 0.05 % NA SOLN: 1.0000 | Freq: Once | NASAL | Status: DC

## 2023-09-15 MED ORDER — OXYMETAZOLINE HCL 0.05 % NA SOLN: 1.0000 | Freq: Two times a day (BID) | NASAL | Status: DC

## 2023-09-15 MED ORDER — LIDOCAINE 2% (20 MG/ML) 5 ML SYRINGE
INTRAMUSCULAR | Status: DC | PRN
  Administered 2023-09-15: 40 mg via INTRAVENOUS

## 2023-09-15 MED ORDER — OXYMETAZOLINE HCL 0.05 % NA SOLN
NASAL | Status: AC
  Filled 2023-09-15: qty 30

## 2023-09-15 NOTE — Telephone Encounter (Signed)
Referral placed patient aware  

## 2023-09-15 NOTE — Anesthesia Preprocedure Evaluation (Addendum)
Anesthesia Evaluation  Patient identified by MRN, date of birth, ID band Patient awake    Reviewed: Allergy & Precautions, NPO status , Patient's Chart, lab work & pertinent test results  Airway Mallampati: I  TM Distance: >3 FB Neck ROM: Full    Dental  (+) Teeth Intact, Dental Advisory Given   Pulmonary sleep apnea , COPD,  COPD inhaler, former smoker Quit smoking 2013, 15 pack year history   Pulmonary exam normal breath sounds clear to auscultation       Cardiovascular negative cardio ROS Normal cardiovascular exam Rhythm:Regular Rate:Normal     Neuro/Psych  PSYCHIATRIC DISORDERS Anxiety     negative neurological ROS     GI/Hepatic ,GERD  Controlled and Medicated,,(+)       marijuana use  Endo/Other  negative endocrine ROS    Renal/GU negative Renal ROS  negative genitourinary   Musculoskeletal  (+) Arthritis , Osteoarthritis,    Abdominal   Peds  Hematology negative hematology ROS (+)   Anesthesia Other Findings Semaglutide LD: 10d  Reproductive/Obstetrics negative OB ROS                             Anesthesia Physical Anesthesia Plan  ASA: 2  Anesthesia Plan: MAC   Post-op Pain Management:    Induction:   PONV Risk Score and Plan: 2 and Propofol infusion and TIVA  Airway Management Planned: Natural Airway and Simple Face Mask  Additional Equipment: None  Intra-op Plan:   Post-operative Plan:   Informed Consent: I have reviewed the patients History and Physical, chart, labs and discussed the procedure including the risks, benefits and alternatives for the proposed anesthesia with the patient or authorized representative who has indicated his/her understanding and acceptance.       Plan Discussed with: CRNA  Anesthesia Plan Comments:         Anesthesia Quick Evaluation

## 2023-09-15 NOTE — Discharge Instructions (Addendum)
   You qualify for [facility] consultation will be scheduled

## 2023-09-15 NOTE — Telephone Encounter (Signed)
Received referral re: [facility] from Dr. Vassie Loll. Will get them scheduled with me. Sent message to scheduling team. Read Drivers

## 2023-09-15 NOTE — Anesthesia Postprocedure Evaluation (Signed)
Anesthesia Post Note  Patient: [name] [name]  Procedure(s) Performed: DRUG INDUCED ENDOSCOPY     Patient location during evaluation: PACU Anesthesia Type: MAC Level of consciousness: awake and alert Pain management: pain level controlled Vital Signs Assessment: post-procedure vital signs reviewed and stable Respiratory status: spontaneous breathing, nonlabored ventilation and respiratory function stable Cardiovascular status: blood pressure returned to baseline and stable Postop Assessment: no apparent nausea or vomiting Anesthetic complications: no   No notable events documented.  Last Vitals:  Vitals:   09/15/23 1320 09/15/23 1330  BP: (!) 93/58 97/62  Pulse: 88 85  Resp: 16 16  Temp:    SpO2: 95% 96%    Last Pain:  Vitals:   09/15/23 1330  TempSrc:   PainSc: 0-No pain                 Lannie Fields

## 2023-09-15 NOTE — Transfer of Care (Signed)
Immediate Anesthesia Transfer of Care Note  Patient: [name] [name]  Procedure(s) Performed: DRUG INDUCED ENDOSCOPY  Patient Location: PACU  Anesthesia Type:MAC  Level of Consciousness: awake, alert , and oriented  Airway & Oxygen Therapy: Patient Spontanous Breathing  Post-op Assessment: Report given to RN  Post vital signs: Reviewed and stable  Last Vitals:  Vitals Value Taken Time  BP 102/65   Temp    Pulse 92 09/15/23 1318  Resp 18 09/15/23 1318  SpO2 94 % 09/15/23 1318    Last Pain:  Vitals:   09/15/23 1318  TempSrc:   PainSc: 0-No pain         Complications: No notable events documented.

## 2023-09-15 NOTE — Op Note (Addendum)
Procedure: Evaluation of sleep-disordered breathing by examination of upper airway using an endoscope  CPT Codes: 09811 Evaluation of sleep-disordered breathing by examination of upper airway using an endoscope  Pre-Op Diagnose: Moderate /Severe obstructive sleep apnea with positive airway pressure intolerance (ICD-10 G47.33).  Post-Op Diagnosis: Moderate /Severe obstructive sleep apnea with positive pressure airway intolerance (ICD-10 G47.33).  ANESTHESIA: IV sedation.  ESTIMATED BLOOD LOSS: None.  COMPLICATIONS: None.  BRIEF CLINICAL HISTORY: This is a 68 year old patient with a history of moderate to severe symptomatic obstructive sleep apnea, who is intolerant and unable to achieve benefit with positive pressure therapy.She  presents today for drug-induced sleep endoscopy to better characterize her locations and pattern of obstruction and to predict appropriate medical and/or surgical options moving forward.  PROCEDURE FINDINGS: There was no evidence of complete concentric palatal obstruction and she is a candidate anatomically for hypoglossal nerve stimulation therapy.  DESCRIPTION OF PROCEDURE: The patient was brought to the endoscopy room and was anesthetized via the standard drug-induced sleep endoscopy protocol. The propofol infusion rate was started at 75 mcg and gradually increased at which point, conditions that mimic sleep were gradually observed.   With the patient not responsive to verbal commands, but still with spontaneous respiration, sleep disordered breathing events and associated desaturations were clearly observed  Under these conditions, the flexible endoscope was inserted to examine both sides of the nose as well as the pharynx and larynx.  The VOTE score at baseline was partial AP , complete AP , partial AP, partial AP.  With simulated jaw advancement and tongue advancement, the hypopharyngeal obstruction and secondarily the palatal collapse also improved.  In  summary, there was no evidence of complete concentric palatal obstruction and she is a candidate anatomically for hypoglossal nerve stimulation therapy.  I was present for and performed the entire procedure.  Dictated By: Comer Locket Vassie Loll MD  Post-Op Plan: EN evaluation for implantation  Diagnostic Codes: G47.33 Obstructive sleep apnea (adult)

## 2023-09-15 NOTE — Telephone Encounter (Signed)
She qualifies by DIS[facility] Refer to ENT- Dr Irene Pap or patel

## 2023-09-15 NOTE — H&P (Signed)
HPI: 68 year old female former smoker (40-year pack history) followed for COPD, obstructive sleep apnea and restless leg syndrome   TEST/EVENTS :  January 27 2023 HST showed moderate  OSA with AHI 29/ hr & low sat of 77%    PFTs May 24, 2023 showed mild airflow obstruction with FEV1 at 78%, ratio 66, FVC 91%, positive bronchodilator response, postbronchodilator FEV1 87%, ratio 74, FVC 91%, 12% bronchodilator change, DLCO 97%.     06/23/2023 Follow up ; COPD,. OSA , RLS  Patient returns for a month follow-up.  Patient has underlying COPD.  She says overall breathing is doing okay.  She remains very active.  She continues on Trelegy daily.  No increased albuterol use.  She did have PFTs earlier this month that showed mild airflow obstruction with reversibility.  We reviewed her PFTs in detail.  Patient is a former smoker.  Has a 40-year pack history.  We discussed the lung cancer CT screening program.   Patient has a history of sleep apnea.  Has tried CPAP in the past but was unable to tolerate.  She says she tried several different mask and pressure settings but says she absolutely cannot wear this.  She feels that it makes her very claustrophobic.  She was set up for a home sleep study that was done January 27, 2023 that showed moderate sleep apnea with AHI of 29/hour and SpO2 low at 77%.  We discussed her sleep study results.  Patient would like an alternative to CPAP.  We did discuss inspire device.  She is interested in proceeding in the workup for inspire.  Patient education was given.   Patient does have restless leg syndrome.  She remains on Requip and Neurontin.  She says that it does help but she continues to have daily symptoms.  We discussed that controlling her sleep apnea may help this as well.  She also is treated for iron deficiency anemia.     Allergies      Allergies  Allergen Reactions   Mobic [Meloxicam] Anaphylaxis   Buspirone Anxiety            Immunization History   Administered Date(s) Administered   Influenza,inj,Quad PF,6+ Mos 06/10/2021, 04/13/2022   Moderna SARS-COV2 Booster Vaccination 05/12/2022   Moderna Sars-Covid-2 Vaccination 11/19/2019   PFIZER Comirnaty(Gray Top)Covid-19 Tri-Sucrose Vaccine 03/11/2021   PFIZER(Purple Top)SARS-COV-2 Vaccination 10/28/2019, 11/18/2019, 06/25/2020, 06/25/2020   Tdap 07/26/2011, 11/27/2020   Zoster Recombinant(Shingrix) 10/14/2021, 04/06/2022          Past Medical History:  Diagnosis Date   Allergy Mobic   Arthritis     COPD (chronic obstructive pulmonary disease) (HCC)     Emphysema of lung (HCC)     GERD (gastroesophageal reflux disease)     Hard of hearing     Hyperlipidemia 04/13/2022   OCD (obsessive compulsive disorder)     Restless legs syndrome (RLS)     Screening for colorectal cancer 06/14/2022   Sleep apnea            Tobacco History: Tobacco Use History  Social History        Tobacco Use  Smoking Status Former   Current packs/day: 0.00   Average packs/day: 1 pack/day for 15.0 years (15.0 ttl pk-yrs)   Types: Cigarettes, E-cigarettes   Start date: 03/23/1997   Quit date: 03/23/2012   Years since quitting: 11.2   Passive exposure: Never  Smokeless Tobacco Never      Counseling given: Not Answered  Outpatient Medications Prior to Visit  Medication Sig Dispense Refill   cyclobenzaprine (FLEXERIL) 5 MG tablet Take 1 tablet (5 mg total) by mouth 2 (two) times daily as needed. 15 tablet 0   albuterol (VENTOLIN HFA) 108 (90 Base) MCG/ACT inhaler INHALE 2 PUFFS INTO THE LUNGS EVERY 6 HOURS AS NEEDED FOR WHEEZING OR SHORTNESS OF BREATH 6.7 g 2   citalopram (CELEXA) 40 MG tablet Take 1 tablet (40 mg total) by mouth daily. 30 tablet 0   cyanocobalamin (VITAMIN B12) 1000 MCG/ML injection ONCE MONTHLY. 22 mL 0   Fluticasone-Umeclidin-Vilant (TRELEGY ELLIPTA) 100-62.5-25 MCG/ACT AEPB Inhale 1 puff into the lungs daily. 1 each 1   gabapentin (NEURONTIN) 600 MG tablet TAKE 1  TABLET BY MOUTH THREE TIMES DAILY AND 2 TABLETS BY MOUTH EVERY EVENING 150 tablet 0   HYDROcodone-acetaminophen (NORCO) 5-325 MG tablet Take 1 tablet by mouth every 6 (six) hours as needed for moderate pain. 16 tablet 0   Magnesium 500 MG TABS Take 500 mg by mouth daily.       NEEDLE, DISP, 23 G (BD DISP NEEDLE) 23G X 1" MISC Needle used for B12 IM injections. 22 each 0   omeprazole (PRILOSEC) 40 MG capsule Take 1 capsule (40 mg total) by mouth daily. 90 capsule 3   rOPINIRole (REQUIP) 4 MG tablet Take 1 tablet (4 mg total) by mouth at bedtime. 90 tablet 3   rosuvastatin (CRESTOR) 5 MG tablet Take 1 tablet (5 mg total) by mouth daily. 90 tablet 3   Semaglutide-Weight Management 2.4 MG/0.75ML SOAJ Inject 2.4 mg into the skin once a week. 6 mL 2   SYRINGE-NEEDLE, DISP, 3 ML (B-D 3CC LUER-LOK SYR 18GX1-1/2) 18G X 1-1/2" 3 ML MISC Syringe/needle to draw up B12 injections. 22 each 0   TRELEGY ELLIPTA 200-62.5-25 MCG/ACT AEPB INHALE 1 PUFF INTO THE LUNGS DAILY 60 each 5      No facility-administered medications prior to visit.          Review of Systems:    Constitutional:   No  weight loss, night sweats,  Fevers, chills, fatigue, or  lassitude.   HEENT:   No headaches,  Difficulty swallowing,  Tooth/dental problems, or  Sore throat,                No sneezing, itching, ear ache, nasal congestion, post nasal drip,    CV:  No chest pain,  Orthopnea, PND, swelling in lower extremities, anasarca, dizziness, palpitations, syncope.    GI  No heartburn, indigestion, abdominal pain, nausea, vomiting, diarrhea, change in bowel habits, loss of appetite, bloody stools.    Resp: No chest wall deformity   Skin: no rash or lesions.   GU: no dysuria, change in color of urine, no urgency or frequency.  No flank pain, no hematuria    MS:  No joint pain or swelling.  No decreased range of motion.  No back pain.       Physical Exam   BP 110/64   Pulse 88   Ht 5\' 9"  (1.753 m)   Wt 178 lb (80.7 kg)    SpO2 94% Comment: RA  BMI 26.29 kg/m    GEN: A/Ox3; pleasant , NAD, well nourished    HEENT:  [place]/AT,  NOSE-clear, THROAT-clear, no lesions, no postnasal drip or exudate noted.    NECK:  Supple w/ fair ROM; no JVD; normal carotid impulses w/o bruits; no thyromegaly or nodules palpated; no lymphadenopathy.     RESP  Clear  P & A; w/o, wheezes/ rales/ or rhonchi. no accessory muscle use, no dullness to percussion   CARD:  RRR, no m/r/g, no peripheral edema, pulses intact, no cyanosis or clubbing.   GI:   Soft & nt; nml bowel sounds; no organomegaly or masses detected.    Musco: Warm bil, no deformities or joint swelling noted.    Neuro: alert, no focal deficits noted.     Skin: Warm, no lesions or rashes       Lab Results:   CBC Labs (Brief)          Component Value Date/Time    WBC 5.6 04/26/2023 1132    RBC 4.11 04/26/2023 1132    HGB 12.4 04/26/2023 1132    HCT 37.0 04/26/2023 1132    PLT 196 04/26/2023 1132    MCV 90 04/26/2023 1132    MCH 30.2 04/26/2023 1132    MCHC 33.5 04/26/2023 1132    RDW 12.5 04/26/2023 1132    LYMPHSABS 1.9 04/26/2023 1132    EOSABS 0.1 04/26/2023 1132    BASOSABS 0.0 04/26/2023 1132        BMET Labs (Brief)          Component Value Date/Time    NA 143 04/26/2023 1132    K 4.4 04/26/2023 1132    CL 105 04/26/2023 1132    CO2 25 04/26/2023 1132    GLUCOSE 89 04/26/2023 1132    BUN 11 04/26/2023 1132    CREATININE 0.87 04/26/2023 1132    CALCIUM 9.1 04/26/2023 1132        BNP Labs (Brief)  No results found for: "BNP"     ProBNP Labs (Brief)  No results found for: "PROBNP"     Imaging: Imaging Results  No results found.     Administration History       None               Latest Ref Rng & Units 05/24/2023    2:56 PM  PFT Results  FVC-Pre L 3.46   FVC-Predicted Pre % 91   FVC-Post L 3.47   FVC-Predicted Post % 91   Pre FEV1/FVC % % 66   Post FEV1/FCV % % 74   FEV1-Pre L 2.27   FEV1-Predicted Pre % 78    FEV1-Post L 2.56   DLCO uncorrected ml/min/mmHg 22.53   DLCO UNC% % 97   DLVA Predicted % 102   TLC L 6.69   TLC % Predicted % 115   RV % Predicted % 142       Recent Labs  No results found for: "NITRICOXIDE"             Assessment & Plan:    OSA (obstructive sleep apnea) Moderate to severe sleep apnea-we reviewed her recent sleep study results and went over in detail.  Patient has been CPAP intolerant.  She says she absolutely cannot try CPAP again.  Wants to look into alternative options.  We discussed the inspire device.  She would like to proceed with the inspire workup.  Will set patient up for drug-induced sleep endoscopy.  If a candidate then we will refer out to ENT for placement   Plan  Patient Instructions  Refer to Lung cancer CT chest screening program.  We will be in touch regarding DISE procedure with Dr. Vassie Loll.  Continue on Trelegy 1 puff daily , rinse after use  Albuterol inhaler As needed   Activity as tolerated.  Continue  on Requip and Neurontin .  Follow up with Dr. Vassie Loll  or Parrett NP and As needed           COPD (chronic obstructive pulmonary disease) (HCC) Mild COPD currently maintained on Trelegy. Referred to the lung cancer CT screening program.  Patient remains very active.  Vaccines are up-to-date.   Plan  Patient Instructions  Refer to Lung cancer CT chest screening program.  We will be in touch regarding DISE procedure with Dr. Vassie Loll.  Continue on Trelegy 1 puff daily , rinse after use  Albuterol inhaler As needed   Activity as tolerated.  Continue on Requip and Neurontin .  Follow up with Dr. Vassie Loll  or Parrett NP and As needed        [name] [name] has presented today for procedure, with the diagnosis of OSA. The various methods of treatment have been discussed with the patient and family. After consideration of risks, benefits and other options for treatment, the patient has consented to Procedure(s) :  DISE .  The patient's  history has been reviewed, patient examined, no change in status, stable for surgery. I have reviewed the patient's chart and labs. Questions were answered to the patient's satisfaction   [name] [name] V. Vassie Loll MD

## 2023-09-19 ENCOUNTER — Telehealth (INDEPENDENT_AMBULATORY_CARE_PROVIDER_SITE_OTHER): Payer: Self-pay | Admitting: Otolaryngology

## 2023-09-19 ENCOUNTER — Encounter (HOSPITAL_COMMUNITY): Payer: Self-pay | Admitting: Pulmonary Disease

## 2023-09-19 NOTE — Telephone Encounter (Signed)
Reminder Call: Date: 09/20/2023 Status: Sch  Time: 11:30 AM [address] [place], [place] [address]  Left voicemail w/time and location.

## 2023-09-20 ENCOUNTER — Ambulatory Visit (INDEPENDENT_AMBULATORY_CARE_PROVIDER_SITE_OTHER): Payer: Medicare HMO | Admitting: Otolaryngology

## 2023-09-20 ENCOUNTER — Encounter (INDEPENDENT_AMBULATORY_CARE_PROVIDER_SITE_OTHER): Payer: Self-pay

## 2023-09-20 VITALS — BP 126/75 | HR 86 | Resp 19 | Ht 69.0 in | Wt 182.0 lb

## 2023-09-20 DIAGNOSIS — G4733 Obstructive sleep apnea (adult) (pediatric): Secondary | ICD-10-CM

## 2023-09-20 DIAGNOSIS — Z91198 Patient's noncompliance with other medical treatment and regimen for other reason: Secondary | ICD-10-CM | POA: Diagnosis not present

## 2023-09-20 DIAGNOSIS — Z789 Other specified health status: Secondary | ICD-10-CM

## 2023-09-20 NOTE — Progress Notes (Signed)
Dear Dr. Durwin Nora and Dr. Vassie Loll, Here is my assessment for our mutual patient, [name] [name]. Thank you for allowing me the opportunity to care for your patient. Please do not hesitate to contact me should you have any other questions. Sincerely, Dr. Jovita Kussmaul  Otolaryngology Clinic Note Referring provider: Dr. Vassie Loll HPI:  [name] [name] is a 68 y.o. female kindly referred by Dr. Vassie Loll for evaluation of OSA.  She reports long-standing h/o OSA and attempted CPAP use with multiple masks and an oral appliance. Unfortunately, she is claustrophobic and cannot tolerate the mask so has not been using it. Dr. Reginia Naas office has been managing this, and she was interested in [place], for which she has undergone workup including DISE. She is an anatomic candidate for this. She corroborates this history and reports that she has tried but takes off her CPAP during the night.  She presents today to discuss this.  No insomnia, does not smoke. Does have RLS She has lost over 60 lbs on Wegovy, BMI 26.88  H&N Surgery: no; No chest surgery Personal or FHx of bleeding dz or anesthesia difficulty: no   GLP-1: ZHYQMV AP/AC: no  PMHx: COPD, RLS, GERD  Tobacco: quit 2013 (40 pack year hx). Alcohol: denies significant use. Lives in [place], [place].  Independent Review of Additional Tests or Records:  Dr. Reginia Naas and Tammy Parrett pulm notes (06/23/2023): nobreathing issue, CPAP intolerance. Discussed inspire; DISE 09/15/2023 reviewed: no concentral palatal collapse, anatomic inspire candidate CBC and CMP 04/26/2023: wnl, Plt 196, no kidney disease Sleep study (02/2023) independently reviewed and interpreted and available in media tab: AHI 28.9, Nadir 77%; Central/Mixed Apneas: 2.5 (9%)  PMH/Meds/All/SocHx/FamHx/ROS:   Past Medical History:  Diagnosis Date   Allergy Mobic   Arthritis    COPD (chronic obstructive pulmonary disease) (HCC)    Emphysema of lung (HCC)    GERD (gastroesophageal reflux disease)    Hard of hearing     Hyperlipidemia 04/13/2022   OCD (obsessive compulsive disorder)    Restless legs syndrome (RLS)    Screening for colorectal cancer 06/14/2022   Sleep apnea      Past Surgical History:  Procedure Laterality Date   ABDOMINAL HYSTERECTOMY     BIOPSY  04/22/2022   Procedure: BIOPSY;  Surgeon: Dolores Frame, MD;  [facility];  Service: Gastroenterology;;   BREAST EXCISIONAL BIOPSY Right 1980   benign   CESAREAN SECTION  1996   CHOLECYSTECTOMY  10/2022   COLONOSCOPY WITH PROPOFOL N/A 10/08/2022   Procedure: COLONOSCOPY WITH PROPOFOL;  Surgeon: Dolores Frame, MD;  Location: [facility];  Service: Gastroenterology;  Laterality: N/A;  8:15 am, LM to see if pt can come earlier   DRUG INDUCED ENDOSCOPY N/A 09/15/2023   Procedure: DRUG INDUCED ENDOSCOPY;  Surgeon: Oretha Milch, MD;  Location: [facility];  Service: Pulmonary;  Laterality: N/A;   ESOPHAGOGASTRODUODENOSCOPY (EGD) WITH PROPOFOL N/A 04/22/2022   Procedure: ESOPHAGOGASTRODUODENOSCOPY (EGD) WITH PROPOFOL;  Surgeon: Dolores Frame, MD;  [facility]: [facility];  Service: Gastroenterology;  Laterality: N/A;  205 ASA 2   FRACTURE SURGERY     foot   JOINT REPLACEMENT     left shoulder   JOINT REPLACEMENT     right shoulder   POLYPECTOMY  10/08/2022   Procedure: POLYPECTOMY;  Surgeon: Dolores Frame, MD;  Location: [facility];  Service: Gastroenterology;;    Family History  Problem Relation Age of Onset   Alzheimer's disease Mother    Arthritis Mother  Cancer Mother    Heart attack Father    Heart disease Father    Diabetes Brother    Cancer Brother        possibly kidney   Diabetes Paternal Grandmother    Colon cancer Neg Hx    Colon polyps Neg Hx      Social Connections: Socially Isolated (12/06/2022)   Social Connection and Isolation Panel [NHANES]    Frequency of Communication with Friends and Family: Once a week    Frequency of Social Gatherings  with Friends and Family: Once a week    Attends Religious Services: Never    Database administrator or Organizations: No    Attends Engineer, structural: Not on file    Marital Status: Divorced      Current Outpatient Medications:    albuterol (VENTOLIN HFA) 108 (90 Base) MCG/ACT inhaler, INHALE 2 PUFFS INTO THE LUNGS EVERY 6 HOURS AS NEEDED FOR WHEEZING OR SHORTNESS OF BREATH, Disp: 6.7 g, Rfl: 2   citalopram (CELEXA) 40 MG tablet, TAKE 1 TABLET(40 MG) BY MOUTH DAILY, Disp: 30 tablet, Rfl: 0   cyanocobalamin (VITAMIN B12) 1000 MCG/ML injection, ONCE MONTHLY., Disp: 22 mL, Rfl: 0   cyclobenzaprine (FLEXERIL) 5 MG tablet, Take 1 tablet (5 mg total) by mouth 2 (two) times daily as needed., Disp: 15 tablet, Rfl: 0   Fluticasone-Umeclidin-Vilant (TRELEGY ELLIPTA) 100-62.5-25 MCG/ACT AEPB, INHALE 1 PUFF INTO THE LUNGS DAILY, Disp: 60 each, Rfl: 2   gabapentin (NEURONTIN) 600 MG tablet, TAKE 1 TABLET BY MOUTH THREE TIMES DAILY AND 2 TABLETS BY MOUTH EVERY EVENING, Disp: 150 tablet, Rfl: 0   Magnesium 500 MG TABS, Take 500 mg by mouth daily., Disp: , Rfl:    NEEDLE, DISP, 23 G (BD DISP NEEDLE) 23G X 1" MISC, Needle used for B12 IM injections., Disp: 22 each, Rfl: 0   omeprazole (PRILOSEC) 40 MG capsule, Take 1 capsule (40 mg total) by mouth daily., Disp: 90 capsule, Rfl: 3   rOPINIRole (REQUIP) 4 MG tablet, Take 1 tablet (4 mg total) by mouth at bedtime., Disp: 90 tablet, Rfl: 3   rosuvastatin (CRESTOR) 5 MG tablet, Take 1 tablet (5 mg total) by mouth daily., Disp: 90 tablet, Rfl: 3   Semaglutide-Weight Management 2.4 MG/0.75ML SOAJ, Inject 2.4 mg into the skin once a week., Disp: 6 mL, Rfl: 2   SPIKEVAX syringe, Inject 0.5 mLs into the muscle once., Disp: , Rfl:    SYRINGE-NEEDLE, DISP, 3 ML (B-D 3CC LUER-LOK SYR 18GX1-1/2) 18G X 1-1/2" 3 ML MISC, Syringe/needle to draw up B12 injections., Disp: 22 each, Rfl: 0   traMADol (ULTRAM-ER) 100 MG 24 hr tablet, Take 1 tablet (100 mg total) by  mouth at bedtime as needed for pain., Disp: 30 tablet, Rfl: 0   HYDROcodone-acetaminophen (NORCO) 5-325 MG tablet, Take 1 tablet by mouth every 6 (six) hours as needed for moderate pain. (Patient not taking: Reported on 09/20/2023), Disp: 16 tablet, Rfl: 0   Physical Exam:   BP 126/75 (BP Location: Right Arm, Patient Position: Sitting, Cuff Size: Normal)   Pulse 86   Resp 19   Ht 5\' 9"  (1.753 m)   Wt 182 lb (82.6 kg)   SpO2 97%   BMI 26.88 kg/m   Salient findings:  CN II-XII intact  Bilateral EAC clear and TM intact with well pneumatized middle ear spaces Anterior rhinoscopy: Septum relatively midline; bilateral inferior turbinates without significant hypertrophy No lesions of oral cavity/oropharynx; tonsils <1/<1; Tongue Zachery Conch  3; palate elevation symmetric. TFL was indicated to better evaluate the proximal airway, given the patient's history and exam findings, and is detailed below. No obviously palpable neck masses/lymphadenopathy/thyromegaly No respiratory distress or stridor No neck scars  Seprately Identifiable Procedures:  Procedure Note Pre-procedure diagnosis:  OSA, rule out structural cause Post-procedure diagnosis: Same Procedure: Transnasal Fiberoptic Laryngoscopy, CPT 31575 - Mod 25 Indication: see above Complications: None apparent EBL: 0 mL  The procedure was undertaken to further evaluation give diagnoses above, with mirror exam inadequate for appropriate examination due to gag reflex and poor patient tolerance  Procedure:  Patient was identified as correct patient. Verbal consent was obtained. The nose was sprayed with oxymetazoline and 4% lidocaine. The The flexible laryngoscope was passed through the nose to view the nasal cavity, pharynx (oropharynx, hypopharynx) and larynx.  The larynx was examined at rest and during multiple phonatory tasks. Documentation was obtained and reviewed with patient. The scope was removed. The patient tolerated the procedure  well.  Findings: The nasal cavity and nasopharynx did not reveal any masses or lesions, mucosa appeared to be without obvious lesions. The tongue base, pharyngeal walls, piriform sinuses, vallecula, epiglottis and postcricoid region are normal in appearance. No tongue base masses, no significant oropharynx collapse, Parenteau maneuver ~50% collapse it appears. The visualized portion of the subglottis and proximal trachea is widely patent. The vocal folds are mobile bilaterally. There are no lesions on the free edge of the vocal folds nor elsewhere in the larynx worrisome for malignancy.    Electronically signed by: Read Drivers, MD 09/20/2023 12:56 PM   Impression & Plans:  Minnette Merida is a 68 y.o. female with RLS now with:  1. OSA (obstructive sleep apnea)   2. Intolerance of continuous positive airway pressure (CPAP) ventilation    Most recent sleep study showing AH ~29, with BMI 26. Anatomic candidate for Inspire due to CPAP intolerance (cannot tolerate mask - pulls it off) and severity of OSA. We had a discussion regarding risks of Inspire including lack of benefit, persistent symptoms, pneumothorax, tongue soreness or weakness, Floor of mouth numbness, injury to major vessels, hematoma, implant infection, bleeding, scarring, tethering of neck, persistent symptoms, need for further procedures, and risk of anesthesia among others. She understands and wishes to proceed  - Will submit for insurance pre-auth for Right hypoglossal nerve stimulator placement - f/u 2 weeks post op  See below regarding exact medications prescribed this encounter including dosages and route: No orders of the defined types were placed in this encounter.     Thank you for allowing me the opportunity to care for your patient. Please do not hesitate to contact me should you have any other questions.  Sincerely, Jovita Kussmaul, MD Otolaryngologist (ENT), [facility] Phone: [phone] Fax:  [phone]  09/20/2023, 12:40 PM   MDM:  Level 4 Complexity/Problems addressed: mod - chronic problems Data complexity: mod - independent review of multiple notes, labs; independent interpretation of tests - Morbidity: mod - decision for surgery  - Prescription Drug prescribed or managed: no

## 2023-09-26 ENCOUNTER — Encounter (HOSPITAL_BASED_OUTPATIENT_CLINIC_OR_DEPARTMENT_OTHER): Payer: Self-pay | Admitting: Pulmonary Disease

## 2023-09-26 ENCOUNTER — Encounter: Payer: Self-pay | Admitting: Orthopedic Surgery

## 2023-10-04 ENCOUNTER — Encounter (HOSPITAL_BASED_OUTPATIENT_CLINIC_OR_DEPARTMENT_OTHER): Payer: Self-pay | Admitting: Pulmonary Disease

## 2023-10-08 ENCOUNTER — Other Ambulatory Visit: Payer: Self-pay | Admitting: Internal Medicine

## 2023-10-10 ENCOUNTER — Other Ambulatory Visit: Payer: Self-pay | Admitting: Internal Medicine

## 2023-10-12 ENCOUNTER — Other Ambulatory Visit: Payer: Self-pay | Admitting: Pulmonary Disease

## 2023-10-15 ENCOUNTER — Other Ambulatory Visit: Payer: Self-pay | Admitting: Internal Medicine

## 2023-10-15 DIAGNOSIS — H903 Sensorineural hearing loss, bilateral: Secondary | ICD-10-CM

## 2023-10-15 DIAGNOSIS — R292 Abnormal reflex: Secondary | ICD-10-CM

## 2023-10-15 DIAGNOSIS — G2581 Restless legs syndrome: Secondary | ICD-10-CM

## 2023-10-24 ENCOUNTER — Ambulatory Visit: Payer: BC Managed Care – PPO | Admitting: Internal Medicine

## 2023-11-09 ENCOUNTER — Other Ambulatory Visit: Payer: Self-pay | Admitting: Internal Medicine

## 2023-11-18 ENCOUNTER — Other Ambulatory Visit: Payer: Self-pay

## 2023-11-18 ENCOUNTER — Encounter (HOSPITAL_BASED_OUTPATIENT_CLINIC_OR_DEPARTMENT_OTHER): Payer: Self-pay | Admitting: *Deleted

## 2023-11-23 ENCOUNTER — Encounter (HOSPITAL_BASED_OUTPATIENT_CLINIC_OR_DEPARTMENT_OTHER): Admission: RE | Payer: Self-pay | Source: Home / Self Care

## 2023-11-23 ENCOUNTER — Ambulatory Visit (HOSPITAL_BASED_OUTPATIENT_CLINIC_OR_DEPARTMENT_OTHER): Admission: RE | Admit: 2023-11-23 | Payer: Medicare HMO | Source: Home / Self Care

## 2023-11-23 ENCOUNTER — Other Ambulatory Visit: Payer: Self-pay | Admitting: Internal Medicine

## 2023-11-23 DIAGNOSIS — Z01818 Encounter for other preprocedural examination: Secondary | ICD-10-CM

## 2023-11-23 DIAGNOSIS — G2581 Restless legs syndrome: Secondary | ICD-10-CM

## 2023-11-23 HISTORY — DX: Anxiety disorder, unspecified: F41.9

## 2023-11-23 HISTORY — DX: Depression, unspecified: F32.A

## 2023-11-23 SURGERY — INSERTION, HYPOGLOSSAL NERVE STIMULATOR
Anesthesia: Choice

## 2023-12-07 ENCOUNTER — Encounter (INDEPENDENT_AMBULATORY_CARE_PROVIDER_SITE_OTHER): Payer: Medicare HMO

## 2023-12-09 ENCOUNTER — Other Ambulatory Visit: Payer: Self-pay | Admitting: Internal Medicine

## 2023-12-10 ENCOUNTER — Other Ambulatory Visit: Payer: Self-pay | Admitting: Internal Medicine

## 2023-12-10 DIAGNOSIS — E785 Hyperlipidemia, unspecified: Secondary | ICD-10-CM

## 2024-01-08 ENCOUNTER — Other Ambulatory Visit: Payer: Self-pay | Admitting: Internal Medicine

## 2024-01-09 ENCOUNTER — Other Ambulatory Visit: Payer: Self-pay | Admitting: Internal Medicine

## 2024-01-17 ENCOUNTER — Ambulatory Visit (INDEPENDENT_AMBULATORY_CARE_PROVIDER_SITE_OTHER): Admitting: Otolaryngology

## 2024-01-17 ENCOUNTER — Other Ambulatory Visit: Payer: Self-pay | Admitting: Internal Medicine

## 2024-01-17 ENCOUNTER — Encounter (INDEPENDENT_AMBULATORY_CARE_PROVIDER_SITE_OTHER): Payer: Self-pay | Admitting: Otolaryngology

## 2024-01-17 DIAGNOSIS — G4733 Obstructive sleep apnea (adult) (pediatric): Secondary | ICD-10-CM

## 2024-01-17 DIAGNOSIS — G2581 Restless legs syndrome: Secondary | ICD-10-CM

## 2024-01-17 DIAGNOSIS — Z789 Other specified health status: Secondary | ICD-10-CM

## 2024-01-17 NOTE — Progress Notes (Signed)
 ENT Telephone visit: Consent was obtained prior. Spoke with patient regarding any questions she had prior to surgery - she did not have any. Briefly discussed expectations including post-op. She knows to hold Wegovy  at least 7 days prior to surgery. No other changes to her health. She is ready to proceed. Advised to call back if she had any other concerns   Thank you for allowing me the opportunity to care for your patient. Please do not hesitate to contact me should you have any other questions.  Sincerely, Milon Aloe, MD Otolaryngologist (ENT), [facility] Phone: [phone] Fax: [phone]  01/17/2024, 5:06 PM    MDM: no charge since preop visit

## 2024-01-18 ENCOUNTER — Other Ambulatory Visit: Payer: Self-pay | Admitting: Internal Medicine

## 2024-01-18 DIAGNOSIS — R292 Abnormal reflex: Secondary | ICD-10-CM

## 2024-01-18 DIAGNOSIS — G2581 Restless legs syndrome: Secondary | ICD-10-CM

## 2024-01-18 DIAGNOSIS — H903 Sensorineural hearing loss, bilateral: Secondary | ICD-10-CM

## 2024-01-23 ENCOUNTER — Encounter (HOSPITAL_BASED_OUTPATIENT_CLINIC_OR_DEPARTMENT_OTHER): Payer: Self-pay

## 2024-01-23 ENCOUNTER — Other Ambulatory Visit: Payer: Self-pay

## 2024-01-27 ENCOUNTER — Ambulatory Visit (HOSPITAL_COMMUNITY)

## 2024-01-27 ENCOUNTER — Encounter (HOSPITAL_COMMUNITY)
Admission: RE | Disposition: A | Discharge status: Home / Self Care | Payer: Self-pay | Source: Home / Self Care | Attending: Otolaryngology

## 2024-01-27 ENCOUNTER — Ambulatory Visit (HOSPITAL_COMMUNITY): Admitting: Anesthesiology

## 2024-01-27 ENCOUNTER — Ambulatory Visit (HOSPITAL_COMMUNITY)
Admission: RE | Admit: 2024-01-27 | Discharge: 2024-01-27 | Disposition: A | Discharge status: Home / Self Care | Attending: Otolaryngology | Admitting: Otolaryngology

## 2024-01-27 ENCOUNTER — Other Ambulatory Visit: Payer: Self-pay

## 2024-01-27 ENCOUNTER — Encounter (HOSPITAL_COMMUNITY): Payer: Self-pay

## 2024-01-27 DIAGNOSIS — Z01818 Encounter for other preprocedural examination: Secondary | ICD-10-CM

## 2024-01-27 DIAGNOSIS — G4733 Obstructive sleep apnea (adult) (pediatric): Secondary | ICD-10-CM

## 2024-01-27 DIAGNOSIS — Z6826 Body mass index (BMI) 26.0-26.9, adult: Secondary | ICD-10-CM | POA: Diagnosis not present

## 2024-01-27 DIAGNOSIS — E663 Overweight: Secondary | ICD-10-CM | POA: Insufficient documentation

## 2024-01-27 DIAGNOSIS — M1990 Unspecified osteoarthritis, unspecified site: Secondary | ICD-10-CM | POA: Insufficient documentation

## 2024-01-27 DIAGNOSIS — J939 Pneumothorax, unspecified: Secondary | ICD-10-CM | POA: Diagnosis not present

## 2024-01-27 DIAGNOSIS — Z79899 Other long term (current) drug therapy: Secondary | ICD-10-CM | POA: Insufficient documentation

## 2024-01-27 DIAGNOSIS — K219 Gastro-esophageal reflux disease without esophagitis: Secondary | ICD-10-CM | POA: Insufficient documentation

## 2024-01-27 DIAGNOSIS — Z789 Other specified health status: Secondary | ICD-10-CM

## 2024-01-27 DIAGNOSIS — R0989 Other specified symptoms and signs involving the circulatory and respiratory systems: Secondary | ICD-10-CM | POA: Diagnosis not present

## 2024-01-27 DIAGNOSIS — Z87891 Personal history of nicotine dependence: Secondary | ICD-10-CM | POA: Diagnosis not present

## 2024-01-27 DIAGNOSIS — Z91198 Patient's noncompliance with other medical treatment and regimen for other reason: Secondary | ICD-10-CM

## 2024-01-27 DIAGNOSIS — J449 Chronic obstructive pulmonary disease, unspecified: Secondary | ICD-10-CM | POA: Diagnosis not present

## 2024-01-27 HISTORY — PX: IMPLANTATION OF HYPOGLOSSAL NERVE STIMULATOR: SHX6827

## 2024-01-27 SURGERY — INSERTION, HYPOGLOSSAL NERVE STIMULATOR
Anesthesia: General | Site: Neck

## 2024-01-27 MED ORDER — OXYCODONE HCL 5 MG/5ML PO SOLN: 5.0000 mg | Freq: Once | ORAL | Status: DC | PRN

## 2024-01-27 MED ORDER — OXYCODONE HCL 5 MG PO TABS: 5.0000 mg | ORAL_TABLET | Freq: Four times a day (QID) | ORAL | 0 refills | Status: AC | PRN

## 2024-01-27 MED ORDER — LIDOCAINE 2% (20 MG/ML) 5 ML SYRINGE
INTRAMUSCULAR | Status: AC
Start: 2024-01-27 — End: ?
  Filled 2024-01-27: qty 5

## 2024-01-27 MED ORDER — CEFAZOLIN SODIUM-DEXTROSE 2-3 GM-%(50ML) IV SOLR
INTRAVENOUS | Status: DC | PRN
Start: 2024-01-27 — End: 2024-01-27
  Administered 2024-01-27: 2 g via INTRAVENOUS

## 2024-01-27 MED ORDER — OXYCODONE HCL 5 MG PO TABS: 5.0000 mg | ORAL_TABLET | Freq: Once | ORAL | Status: DC | PRN

## 2024-01-27 MED ORDER — BACITRACIN ZINC 500 UNIT/GM EX OINT
TOPICAL_OINTMENT | CUTANEOUS | Status: DC | PRN
  Administered 2024-01-27: 1 via TOPICAL

## 2024-01-27 MED ORDER — PROPOFOL 10 MG/ML IV BOLUS
INTRAVENOUS | Status: AC
  Filled 2024-01-27: qty 20

## 2024-01-27 MED ORDER — SODIUM CHLORIDE 0.9 % IV SOLN
INTRAVENOUS | Status: DC | PRN
  Administered 2024-01-27: 1000 mL

## 2024-01-27 MED ORDER — FENTANYL CITRATE (PF) 100 MCG/2ML IJ SOLN
INTRAMUSCULAR | Status: AC
  Filled 2024-01-27: qty 2

## 2024-01-27 MED ORDER — ONDANSETRON HCL 4 MG/2ML IJ SOLN
INTRAMUSCULAR | Status: AC
Start: 2024-01-27 — End: ?
  Filled 2024-01-27: qty 2

## 2024-01-27 MED ORDER — SUCCINYLCHOLINE CHLORIDE 200 MG/10ML IV SOSY
PREFILLED_SYRINGE | INTRAVENOUS | Status: DC | PRN
  Administered 2024-01-27: 100 mg via INTRAVENOUS

## 2024-01-27 MED ORDER — ONDANSETRON HCL 4 MG/2ML IJ SOLN: INTRAMUSCULAR | Status: DC | PRN

## 2024-01-27 MED ORDER — ACETAMINOPHEN 500 MG PO TABS: 1000.0000 mg | ORAL_TABLET | Freq: Four times a day (QID) | ORAL | 0 refills | Status: AC | PRN

## 2024-01-27 MED ORDER — ONDANSETRON HCL 4 MG/2ML IJ SOLN
INTRAMUSCULAR | Status: DC | PRN
  Administered 2024-01-27: 4 mg via INTRAVENOUS

## 2024-01-27 MED ORDER — PROPOFOL 500 MG/50ML IV EMUL
INTRAVENOUS | Status: DC | PRN
  Administered 2024-01-27: 50 ug/kg/min via INTRAVENOUS

## 2024-01-27 MED ORDER — PROPOFOL 10 MG/ML IV BOLUS
INTRAVENOUS | Status: DC | PRN
  Administered 2024-01-27: 20 mg via INTRAVENOUS
  Administered 2024-01-27: 140 mg via INTRAVENOUS

## 2024-01-27 MED ORDER — SODIUM CHLORIDE 0.9 % IV SOLN: 1.0000 | Freq: Once | INTRAVENOUS | Status: DC

## 2024-01-27 MED ORDER — BACITRACIN ZINC 500 UNIT/GM EX OINT: TOPICAL_OINTMENT | CUTANEOUS | 0 refills | Status: AC

## 2024-01-27 MED ORDER — FENTANYL CITRATE (PF) 100 MCG/2ML IJ SOLN
25.0000 ug | INTRAMUSCULAR | Status: DC | PRN
  Administered 2024-01-27: 50 ug via INTRAVENOUS

## 2024-01-27 MED ORDER — LIDOCAINE-EPINEPHRINE 1 %-1:100000 IJ SOLN
INTRAMUSCULAR | Status: AC
  Filled 2024-01-27: qty 1

## 2024-01-27 MED ORDER — PHENYLEPHRINE HCL-NACL 20-0.9 MG/250ML-% IV SOLN
INTRAVENOUS | Status: DC | PRN
  Administered 2024-01-27: 50 ug/min via INTRAVENOUS

## 2024-01-27 MED ORDER — SODIUM CHLORIDE 0.9 % IV SOLN
Freq: Once | INTRAVENOUS | Status: DC
  Filled 2024-01-27: qty 10

## 2024-01-27 MED ORDER — EPHEDRINE SULFATE (PRESSORS) 50 MG/ML IJ SOLN
INTRAMUSCULAR | Status: DC | PRN
  Administered 2024-01-27 (×2): 10 mg via INTRAVENOUS

## 2024-01-27 MED ORDER — LACTATED RINGERS IV SOLN: INTRAVENOUS | Status: DC

## 2024-01-27 MED ORDER — LIDOCAINE HCL (CARDIAC) PF 100 MG/5ML IV SOSY
PREFILLED_SYRINGE | INTRAVENOUS | Status: DC | PRN
  Administered 2024-01-27: 100 mg via INTRAVENOUS

## 2024-01-27 MED ORDER — SODIUM CHLORIDE 0.9 % IV SOLN: 12.5000 mg | INTRAVENOUS | Status: DC | PRN

## 2024-01-27 MED ORDER — ACETAMINOPHEN 10 MG/ML IV SOLN: 1000.0000 mg | Freq: Once | INTRAVENOUS | Status: DC | PRN

## 2024-01-27 MED ORDER — DEXAMETHASONE SODIUM PHOSPHATE 4 MG/ML IJ SOLN
INTRAMUSCULAR | Status: DC | PRN
  Administered 2024-01-27: 10 mg via INTRAVENOUS

## 2024-01-27 MED ORDER — MIDAZOLAM HCL 5 MG/5ML IJ SOLN
INTRAMUSCULAR | Status: DC | PRN
  Administered 2024-01-27: 2 mg via INTRAVENOUS

## 2024-01-27 MED ORDER — FENTANYL CITRATE (PF) 100 MCG/2ML IJ SOLN
INTRAMUSCULAR | Status: DC | PRN
  Administered 2024-01-27 (×3): 50 ug via INTRAVENOUS

## 2024-01-27 MED ORDER — FENTANYL CITRATE (PF) 250 MCG/5ML IJ SOLN
INTRAMUSCULAR | Status: AC
  Filled 2024-01-27: qty 5

## 2024-01-27 MED ORDER — MIDAZOLAM HCL 2 MG/2ML IJ SOLN
INTRAMUSCULAR | Status: AC
Start: 2024-01-27 — End: ?
  Filled 2024-01-27: qty 2

## 2024-01-27 MED ORDER — CEFAZOLIN SODIUM-DEXTROSE 2-4 GM/100ML-% IV SOLN
INTRAVENOUS | Status: AC
  Filled 2024-01-27: qty 100

## 2024-01-27 MED ORDER — DEXAMETHASONE SODIUM PHOSPHATE 10 MG/ML IJ SOLN
INTRAMUSCULAR | Status: AC
Start: 2024-01-27 — End: ?
  Filled 2024-01-27: qty 1

## 2024-01-27 MED ORDER — DOUBLE ANTIBIOTIC 500-10000 UNIT/GM EX OINT
TOPICAL_OINTMENT | CUTANEOUS | Status: AC
  Filled 2024-01-27: qty 28.4

## 2024-01-27 MED ORDER — BACITRACIN ZINC 500 UNIT/GM EX OINT
TOPICAL_OINTMENT | CUTANEOUS | Status: AC
  Filled 2024-01-27: qty 28.35

## 2024-01-27 MED ORDER — LIDOCAINE-EPINEPHRINE 1 %-1:100000 IJ SOLN
INTRAMUSCULAR | Status: DC | PRN
  Administered 2024-01-27: 9 mL

## 2024-01-27 SURGICAL SUPPLY — 61 items
BAG COUNTER SPONGE SURGICOUNT (BAG) ×1 IMPLANT
BLADE CLIPPER SURG (BLADE) IMPLANT
BLADE SURG 15 STRL LF DISP TIS (BLADE) ×1 IMPLANT
CORD BIPOLAR FORCEPS 12FT (ELECTRODE) ×1 IMPLANT
COVER PROBE W GEL 5X96 (DRAPES) ×1 IMPLANT
COVER SURGICAL LIGHT HANDLE (MISCELLANEOUS) ×1 IMPLANT
DERMABOND ADVANCED .7 DNX12 (GAUZE/BANDAGES/DRESSINGS) ×1 IMPLANT
DRAPE C-ARM 35X43 STRL (DRAPES) ×1 IMPLANT
DRAPE HEAD BAR (DRAPES) ×1 IMPLANT
DRAPE INCISE IOBAN 66X45 STRL (DRAPES) ×1 IMPLANT
DRAPE MICROSCOPE LEICA 54X105 (DRAPES) ×1 IMPLANT
DRAPE UTILITY XL STRL (DRAPES) ×1 IMPLANT
DRSG TEGADERM 4X4.75 (GAUZE/BANDAGES/DRESSINGS) ×3 IMPLANT
ELECT COATED BLADE 2.86 ST (ELECTRODE) ×1 IMPLANT
ELECTRODE EMG 18 NIMS (NEUROSURGERY SUPPLIES) ×1 IMPLANT
ELECTRODE REM PT RTRN 9FT ADLT (ELECTROSURGICAL) ×1 IMPLANT
FORCEPS BIPOLAR SPETZLER 8 1.0 (NEUROSURGERY SUPPLIES) ×1 IMPLANT
GAUZE 4X4 16PLY ~~LOC~~+RFID DBL (SPONGE) ×1 IMPLANT
GAUZE SPONGE 4X4 12PLY STRL (GAUZE/BANDAGES/DRESSINGS) ×1 IMPLANT
GENERATOR PULSE INSPIRE (Generator) ×1 IMPLANT
GENERATOR PULSE INSPIRE IV (Generator) ×1 IMPLANT
GLOVE BIO SURGEON STRL SZ7 (GLOVE) IMPLANT
GLOVE BIO SURGEON STRL SZ7.5 (GLOVE) ×1 IMPLANT
GLOVE BIOGEL PI IND STRL 7.0 (GLOVE) IMPLANT
GLOVE SURG SS PI 7.0 STRL IVOR (GLOVE) IMPLANT
GOWN STRL REUS W/ TWL LRG LVL3 (GOWN DISPOSABLE) ×3 IMPLANT
KIT BASIN OR (CUSTOM PROCEDURE TRAY) ×1 IMPLANT
KIT NEURO ACCESSORY W/WRENCH (MISCELLANEOUS) IMPLANT
KIT TURNOVER KIT B (KITS) ×1 IMPLANT
LEAD SENSING RESP INSPIRE (Lead) ×1 IMPLANT
LEAD SENSING RESP INSPIRE IV (Lead) ×1 IMPLANT
LEAD SLEEP STIM INSPIRE IV/V (Lead) ×1 IMPLANT
LEAD SLEEP STIMULATION INSPIRE (Lead) ×1 IMPLANT
LOOP VASCLR MAXI BLUE 18IN ST (MISCELLANEOUS) ×1 IMPLANT
LOOPS VASCLR MAXI BLUE 18IN ST (MISCELLANEOUS) ×2 IMPLANT
MARKER SKIN DUAL TIP RULER LAB (MISCELLANEOUS) ×2 IMPLANT
NDL HYPO 25GX1X1/2 BEV (NEEDLE) ×1 IMPLANT
NEEDLE HYPO 25GX1X1/2 BEV (NEEDLE) ×1 IMPLANT
NS IRRIG 1000ML POUR BTL (IV SOLUTION) ×1 IMPLANT
PAD ARMBOARD POSITIONER FOAM (MISCELLANEOUS) ×1 IMPLANT
PASSER CATH 38CM DISP (INSTRUMENTS) ×1 IMPLANT
PENCIL SMOKE EVACUATOR (MISCELLANEOUS) ×1 IMPLANT
POSITIONER HEAD DONUT 9IN (MISCELLANEOUS) ×1 IMPLANT
PROBE NERVE STIMULATOR (NEUROSURGERY SUPPLIES) ×1 IMPLANT
REMOTE CONTROL SLEEP INSPIRE (MISCELLANEOUS) ×1 IMPLANT
SET WALTER ACTIVATION W/DRAPE (SET/KITS/TRAYS/PACK) ×1 IMPLANT
SPONGE INTESTINAL PEANUT (DISPOSABLE) ×1 IMPLANT
SUT PLAIN GUT FAST 5-0 (SUTURE) IMPLANT
SUT SILK 2 0 SH (SUTURE) ×1 IMPLANT
SUT SILK 2 0 SH CR/8 (SUTURE) IMPLANT
SUT SILK 3 0 REEL (SUTURE) ×1 IMPLANT
SUT SILK 3 0 SH 30 (SUTURE) ×2 IMPLANT
SUT VIC AB 3-0 SH 27X BRD (SUTURE) ×2 IMPLANT
SUT VIC AB 3-0 SH 8-18 (SUTURE) IMPLANT
SUT VIC AB 4-0 PS2 27 (SUTURE) ×2 IMPLANT
SUTURE SILK 3-0 RB1 30XBRD (SUTURE) ×1 IMPLANT
SYR 10ML LL (SYRINGE) ×1 IMPLANT
TAPE CLOTH SURG 4X10 WHT LF (GAUZE/BANDAGES/DRESSINGS) ×1 IMPLANT
TOWEL GREEN STERILE (TOWEL DISPOSABLE) ×1 IMPLANT
TRAY ENT MC OR (CUSTOM PROCEDURE TRAY) ×1 IMPLANT
VASCULAR TIE MINI RED 18IN STL (MISCELLANEOUS) ×1 IMPLANT

## 2024-01-27 NOTE — Anesthesia Procedure Notes (Signed)
 Procedure Name: Intubation Date/Time: 01/27/2024 10:43 AM  Performed by: Arvilla Birmingham, CRNAPre-anesthesia Checklist: Patient identified, Emergency Drugs available, Suction available and Patient being monitored Patient Re-evaluated:Patient Re-evaluated prior to induction Oxygen Delivery Method: Circle system utilized Preoxygenation: Pre-oxygenation with 100% oxygen Induction Type: IV induction Ventilation: Mask ventilation without difficulty Laryngoscope Size: Mac and 3 Grade View: Grade I Tube type: Oral Tube size: 7.0 mm Number of attempts: 1 Airway Equipment and Method: Stylet and Oral airway Placement Confirmation: ETT inserted through vocal cords under direct vision, positive ETCO2 and breath sounds checked- equal and bilateral Secured at: 21 cm Tube secured with: Tape Dental Injury: Teeth and Oropharynx as per pre-operative assessment

## 2024-01-27 NOTE — Discharge Instructions (Addendum)
 Post Anesthesia Guidelines  During recovery from anesthesia  You may feel drowsy and reflexes may be slowed for 24 hours:  -Do not drive, use machinery, appliances, ride bicycles or scooters  -Do not consume alcohol  -Do not make important decisions   Eating and drinking:  -Drink plenty of liquids today  -Avoid fried or spicy foods today  -Return to your regular diet slowly over the next 24 hours  -Please call if unable to keep fluids down  If your throat is sore: -A breathing tube may have been used during your surgery -Drink cool liquids or gargle with warm salt water  -If soreness lasts more than a few days, please call us   Surgery Discharge Instructions:  Call clinic or return to ED if you: - develop a fever greater than 101.4 - have shaking chills or are feeling ill - become short of breath - have uncontrollable nausea or vomiting - can't hold down food or liquids or feel as though you are getting dehydrated - have significant leakage or drainage from wound - urine output of less than 30cc/hr for 12 hours - develop significant redness, pain at incision(s) or wound opens up/separates - any other acute events, problems, or concerns  Wound Care/Dressings/Drain Instructions:  - To take care of your incision/cuts on neck and chest - Apply bacitracin ointment to the incision only on neck and chest twice per day for 7 days. Then switch to vaseline. - It is ok to shower in 48 hours, but avoid rubbing the incision or submerging under water  like in a bath or swimming pool - Your stitches are absorbable and will dissolve on their own  - In about a week, do gentle neck rolls (5 rolls twice per day) to prevent any tethering of the neck  Medications: - Resume your regular home medications except as detailed in the medication reconciliation.  - For pain, take tylenol  1000mg  every 6 hours. Avoid NSAIDs like ibuprofen  or aleeve or motrin . If that is not sufficient, a stronger pain  medication has been prescribed to you (oxycodone  5mg  tablet every 4-6 hours). Do not mix with any other narcotic medication such as the tramadol  you were prescribed recently.  Follow Up:  - A follow up appointment should be scheduled for you after discharge. However, If you do not hear about your appoinment in 3 business days, please call the provided surgery clinic number and confirm/schedule your appointment.    Activity/Restrictions:  - Resume your regular activities, as tolerated. Avoid heavy lifting or straining (more than 5 lbs) for 10 days.  Diet: - Resume your regular diet, as tolerated  Additional Instructions: - Please take an over the counter stool softener while taking narcotic pain medication - DO NOT MIX NARCOTIC PAIN MEDICATIONS OR TAKE NARCOTIC PRESCRIPTIONS AT THE SAME TIME - DO NOT DRIVE OR OPERATE HEAVY MACHINERY WHILE ON NARCOTICS  - DO NOT TAKE MORE THAN 4 GRAMS (4000mg ) OF TYLENOL  (ACETAMINOPHEN ) IN 24 HOURS  Department of Otolaryngology Contact Info: Otolaryngology Front Desk Phone including questions about appointments or questions: [phone] - If after normal business hours (Monday-Friday after 5PM or Weekends/Holidays), please call same number and follow prompts for Patient Access Line. There is a physician on call for urgent matters. For life threatening emergencies, please call 911   Post Anesthesia Home Care Instructions  Activity: Get plenty of rest for the remainder of the day. A responsible individual must stay with you for 24 hours following the procedure.  For the next 24 hours,  DO NOT: -Drive a car -Advertising copywriter -Drink alcoholic beverages -Take any medication unless instructed by your physician -Make any legal decisions or sign important papers.  Meals: Start with liquid foods such as gelatin or soup. Progress to regular foods as tolerated. Avoid greasy, spicy, heavy foods. If nausea and/or vomiting occur, drink only clear liquids until  the nausea and/or vomiting subsides. Call your physician if vomiting continues.  Special Instructions/Symptoms: Your throat may feel dry or sore from the anesthesia or the breathing tube placed in your throat during surgery. If this causes discomfort, gargle with warm salt water . The discomfort should disappear within 24 hours.  If you had a scopolamine  patch placed behind your ear for the management of post- operative nausea and/or vomiting:  1. The medication in the patch is effective for 72 hours, after which it should be removed.  Wrap patch in a tissue and discard in the trash. Wash hands thoroughly with soap and water . 2. You may remove the patch earlier than 72 hours if you experience unpleasant side effects which may include dry mouth, dizziness or visual disturbances. 3. Avoid touching the patch. Wash your hands with soap and water  after contact with the patch.

## 2024-01-27 NOTE — Anesthesia Preprocedure Evaluation (Signed)
 Anesthesia Evaluation  Patient identified by MRN, date of birth, ID band Patient awake    Reviewed: Allergy & Precautions, Patient's Chart, lab work & pertinent test results  History of Anesthesia Complications Negative for: history of anesthetic complications  Airway Mallampati: II  TM Distance: >3 FB Neck ROM: Full    Dental  (+) Missing,    Pulmonary sleep apnea , COPD, former smoker   Pulmonary exam normal        Cardiovascular negative cardio ROS Normal cardiovascular exam     Neuro/Psych   Anxiety Depression    RLS    GI/Hepatic Neg liver ROS,GERD  Poorly Controlled and Medicated,,  Endo/Other  On Wegovy   Renal/GU negative Renal ROS     Musculoskeletal  (+) Arthritis ,    Abdominal   Peds  Hematology negative hematology ROS (+)   Anesthesia Other Findings Day of surgery medications reviewed with patient.  Reproductive/Obstetrics                             Anesthesia Physical Anesthesia Plan  ASA: 2  Anesthesia Plan: General   Post-op Pain Management: Ofirmev  IV (intra-op)*   Induction: Intravenous and Rapid sequence  PONV Risk Score and Plan: 3 and Treatment may vary due to age or medical condition, Ondansetron , Dexamethasone  and Midazolam   Airway Management Planned: Oral ETT  Additional Equipment: None  Intra-op Plan:   Post-operative Plan: Extubation in OR  Informed Consent: I have reviewed the patients History and Physical, chart, labs and discussed the procedure including the risks, benefits and alternatives for the proposed anesthesia with the patient or authorized representative who has indicated his/her understanding and acceptance.     Dental advisory given  Plan Discussed with: CRNA  Anesthesia Plan Comments:        Anesthesia Quick Evaluation

## 2024-01-27 NOTE — Transfer of Care (Signed)
 Immediate Anesthesia Transfer of Care Note  Patient: [name] [name]  Procedure(s) Performed: INSERTION HYPOGLOSSAL NERVE STIMULATOR (Neck)  Patient Location: PACU  Anesthesia Type:General  Level of Consciousness: awake, alert , and oriented  Airway & Oxygen Therapy: Patient Spontanous Breathing and Patient connected to face mask oxygen  Post-op Assessment: Report given to RN and Post -op Vital signs reviewed and stable  Post vital signs: Reviewed and stable  Last Vitals:  Vitals Value Taken Time  BP 123/96 01/27/24 1340  Temp 37.1 C 01/27/24 1340  Pulse 99 01/27/24 1344  Resp 17 01/27/24 1344  SpO2 97 % 01/27/24 1344  Vitals shown include unfiled device data.  Last Pain:  Vitals:   01/27/24 0922  TempSrc: Temporal  PainSc: 0-No pain      Patients Stated Pain Goal: 3 (01/27/24 1610)  Complications: No notable events documented.

## 2024-01-27 NOTE — Op Note (Signed)
 Otolaryngology Operative note  [name] [name] Date/Time of Admission: 01/27/2024  8:52 AM  CSN: [id number];MRN:[id number]  DOB: 1956-03-20 Age: 68 y.o. Location: [facility] OR    Pre-Op Diagnosis: Moderate to Severe Obstructive Sleep Apnea (ICD-10 G47.33) Intolerance of Continuous Positive Airway Pressure Ventilation  Post-Op Diagnosis: Same  Procedure: Right 12th cranial nerve (hypoglossal) stimulation implant with placement of chest wall respiratory sensor (CPT [id number]).   Surgeon: Milon Aloe, MD  Anesthesia type:  GETA  Anesthesiologist: Anesthesiologist: Vernadine Golas, MD CRNA: Arvilla Birmingham, CRNA; Eugenia Hess, CRNA   Staff: Circulator: Glynn Lasso, RN Scrub Person: Donelle Funk Vendor Representative : Candice Chalet  Implants: Implant Name Type Inv. Item Serial No. Manufacturer Lot No. LRB No. Used Action  GENERATOR PULSE INSPIRE - [id number] Generator GENERATOR PULSE INSPIRE [id number] [employer]  Right 1 Implanted  LEAD SENSING RESP INSPIRE - [id number] Lead LEAD SENSING RESP INSPIRE [id number] [employer]  Right 1 Implanted  LEAD SLEEP STIMULATION INSPIRE - [id number] Lead LEAD SLEEP STIMULATION INSPIRE [id number] [employer]  Right 1 Implanted   Specimens: None  EBL: <25cc  Drains: None  Post-op disposition and condition: PACU, hemodynamically stable  Findings: Normal neck and chest anatomy with successful implantation of hypoglossal stimulator. Diagnostic evaluation confirmed an appropriate respiration sensing signal as well as activation of the genioglossus nerve, resulting in genioglossal activation and tongue protrusion, confirmed visually.   Complications: None apparent  Indications and consent:  [name] [name] is a 68 y.o. female with history of moderate to severe obstructive sleep apnea with a BMI of 26 and intolerance of continuous positive airway pressure ventilation therapy. Patient has passed the  clinical, polysomnographic and endoscopic screening criteria for implantation. The patient's options were discussed, including risks/benefits/alternatives for each option. Patient expressed understanding, and despite these risks, consented and decided to proceed with above procedures. Informed consent was signed before proceeding.   Procedure: The patient was brought to the Operating Room and was anesthetized via general endotracheal anesthesia without complication.  A shoulder roll was placed and the patient was prepped and draped in usual sterile fashion with the head turned to the left. Prior to prepping and draping, electrodes were placed in the genioglossus and hyoglossus muscle and connected to the NIM box for intraoperative nerve monitoring. Intraoperative antibiotics and steroids were administered.   1% lidocaine  with 1:100000 epinephrine was injected in the planned and marked submandibular and chest incisions.  The patient was then prepped and draped in the standard fashion for this procedure.     A modified sub-mandibular incision was made in the right upper neck halfway between the hyoid bone and the inferior border of the mandible, about 1cm lateral from midline and about 5cm in length. Dissection was carried down through the subcutaneous tissue and platysma. The inferior border of the submandibular gland was identified as well as the digastric tendon. The submandibular gland and the overlying fascia with the marginal mandibular nerve were retracted superiorly and laterally.  The digastric tendon was retracted inferiorly using two vessel loops. Dissection was carried down under the mylohyoid muscle where the hypoglossal nerve was identified in its usual fashion.    The mylohyoid muscle was retracted anteriorly, and the hypoglossal nerve was dissected medially. The lateral branches to retrusor muscles were identified, and tested intra-operatively using the bipolar NIM stimulator. The  superior/posterior branches innervating the hyoglossus muscle were identified using the NIM stimulator and anatomical cues.  The cuff electrode for the  hypoglossal nerve stimulator was placed distally to these branches innervating the genioglossus, transverse, and vertical muscles. C1 branch was preserved and included. The stimulation lead was anchored to the digastric tendon using two 3-0 silk sutures. The wound bed was then irrigated with antibiotic irrigation. The lead body slack between the cuff and the anchor was gently tucked deep to the submandibular gland.   A second 5 cm incision was made in the right upper chest over the second intercostal space, approximately 3cm lateral to the sternal margin. Dissection was carried down through the skin and subcutaneous tissue to the fascia of the pectoralis muscle. An inferior pocket for the generator was created deep to the subcutaneous layer and superficial to the fascia of the pectoralis muscle.  The pectoralis major fascia was dissected directly over the second intercostal space with subsequent blunt dissection through the muscle.  The pectoralis major and minor were then retracted to expose the fatty layer just superficial to the external intercostal muscles.  The fatty layer was carefully swept away to expose the external intercostal muscles. A throw-down base knot was placed to the fascia of the external intercostals just lateral to the anterior external membrane using 3-0 silk suture.  The external intercostals were gently dissected, approximately 5 mm lateral to the suture knot and the respiratory sense lead was advanced with the sensor facing the pleura into the interfascial plane between the external and internal intercostals. The primary anchor was sutured into place with 3-0 silk on the external intercostals.  The secondary anchor was sutured with 3-0 silk to the pectoralis major with some slack between the anchors.   The stimulation lead was then  tunneled in a subplatysmal plane with blunt dissection under direct visualization and brought out into the sub-clavicular pocket where both the stimulation lead and the respiratory sensing lead were connected to the implantable pulse generator.   The implantable pulse generator was placed in the subclavicular pocket, ensuring the lead body was deep to the generator and secured with use of air knots to the pectoralis fascia using 2-0 silk sutures.  Diagnostic evaluation confirmed good placement of the stimulation cuff as demonstrated by activation of the genioglossus and transverse and vertical muscles, resulting in tongue protrusion, confirmed visually. Diagnostic evaluation also confirmed good respiratory sensor placement as demonstrated by a sensing waveform with rise and fall associated with patient respirations.   The wound bed was then irrigated with antibiotic irrigation and hemostasis was noted adequate.   The chest wound was then closed in three layers with deep 3-0 and 4-0 vicryl sutures and a 5-0 fast gut followed by application of bacitracin ointment. The neck was closed in three layers with deep 3-0 and 4-0 vicryl sutures and 5-0 fast gut suture suture. The patient was then awakened, extubated, and transferred to Recovery Room in stable condition. Post-operative xrays were obtained in the recovery room  Evelina Hippo

## 2024-01-27 NOTE — Anesthesia Postprocedure Evaluation (Signed)
 Anesthesia Post Note  Patient: Magdalynn Davilla  Procedure(s) Performed: INSERTION HYPOGLOSSAL NERVE STIMULATOR (Neck)     Patient location during evaluation: PACU Anesthesia Type: General Level of consciousness: awake and alert Pain management: pain level controlled Vital Signs Assessment: post-procedure vital signs reviewed and stable Respiratory status: spontaneous breathing, nonlabored ventilation and respiratory function stable Cardiovascular status: blood pressure returned to baseline Postop Assessment: no apparent nausea or vomiting Anesthetic complications: no   No notable events documented.  Last Vitals:  Vitals:   01/27/24 1500 01/27/24 1515  BP: (!) 106/57 (!) 108/55  Pulse: 82 87  Resp: 17 13  Temp:  (!) (P) 36.1 C  SpO2: 94% 92%    Last Pain:  Vitals:   01/27/24 1500  TempSrc:   PainSc: 0-No pain                 Rayfield Cairo

## 2024-01-27 NOTE — H&P (Signed)
 Pre-Operative H&P - Day Of Surgery Patient Name: [name] [name] Date:   01/27/2024  HPI: [name] [name] is a 68 y.o. female who presents today for operative treatment of obstructive sleep apnea, intolerance of continuous positive airway pressure ventilation. Patient denies recent significant changes to health or significant new medications or physiologic change in condition which would immediately impact plans. No new types of therapy has been initiated that would change the plan or the appropriateness of the plan.   ROS:  A complete review of systems was obtained and is otherwise negative.   PMH:  Past Medical History:  Diagnosis Date   Allergy Mobic   Anxiety    Arthritis    COPD (chronic obstructive pulmonary disease) (HCC)    Depression    Emphysema of lung (HCC)    GERD (gastroesophageal reflux disease)    Hard of hearing    Hyperlipidemia 04/13/2022   OCD (obsessive compulsive disorder)    Restless legs syndrome (RLS)    Screening for colorectal cancer 06/14/2022   Sleep apnea     PSH:  Past Surgical History:  Procedure Laterality Date   ABDOMINAL HYSTERECTOMY     BIOPSY  04/22/2022   Procedure: BIOPSY;  Surgeon: Urban Garden, MD;  Location: [facility];  Service: Gastroenterology;;   BREAST EXCISIONAL BIOPSY Right 1980   benign   CESAREAN SECTION  1996   CHOLECYSTECTOMY  10/2022   COLONOSCOPY WITH PROPOFOL  N/A 10/08/2022   Procedure: COLONOSCOPY WITH PROPOFOL ;  Surgeon: Urban Garden, MD;  Location: [facility];  Service: Gastroenterology;  Laterality: N/A;  8:15 am, LM to see if pt can come earlier   DRUG INDUCED ENDOSCOPY N/A 09/15/2023   Procedure: DRUG INDUCED ENDOSCOPY;  Surgeon: Lind Repine, MD;  Location: [facility];  Service: Pulmonary;  Laterality: N/A;   ESOPHAGOGASTRODUODENOSCOPY (EGD) WITH PROPOFOL  N/A 04/22/2022   Procedure: ESOPHAGOGASTRODUODENOSCOPY (EGD) WITH PROPOFOL ;  Surgeon: Urban Garden, MD;  Location: [facility];  Service: Gastroenterology;  Laterality: N/A;  205 ASA 2   FRACTURE SURGERY     foot   JOINT REPLACEMENT     left shoulder   JOINT REPLACEMENT     right shoulder   POLYPECTOMY  10/08/2022   Procedure: POLYPECTOMY;  Surgeon: Umberto Ganong, Bearl Limes, MD;  Location: [facility];  Service: Gastroenterology;;    MEDS:   Current Facility-Administered Medications:    lactated ringers  infusion, , Intravenous, Continuous, Gorman Laughter, MD  ALLERGIES: Mobic [meloxicam] and Buspirone  EXAM: Vitals: BP 112/64   Pulse 75   Resp 20   Ht 5\' 9"  (1.753 m)   Wt 77.1 kg   SpO2 97%   BMI 25.10 kg/m   General Awake, at baseline alertness.   HEENT No scleral icterus or conjunctival hemorrhage. Globe position appears normal. External ears  normal. Nose patent without rhinorrhea. No lymphadenopathy. No thyromegaly  Cardiovascular No cyanosis.  Pulmonary No audible stridor. Breathing easily with no labor.  Neuro Symmetric facial movement.   Psychiatry Appropriate affect and mood.  Skin No scars or lesions on face or neck.  Extermities Moves all extremities with normal range of motion.   Other Findings None.   Assessment & Plan: [name] has diagnoses of obstructive sleep apnea, intolerance of continuous positive airway pressure ventilation and will go to the OR today for right hypoglossal nerve stimulator placement. Informed consent was obtained and available in EMR today. All questions have been answered, and risks/benefits/alternatives of procedure as noted in the consent  were discussed in a quiet area. Questions were invited and answered. The patient expressed understanding, provided consent and wished to proceed despite risks.  Evelina Hippo 01/27/2024 9:33 AM

## 2024-01-30 ENCOUNTER — Encounter (HOSPITAL_COMMUNITY): Payer: Self-pay | Admitting: Otolaryngology

## 2024-02-06 ENCOUNTER — Encounter: Payer: Self-pay | Admitting: Internal Medicine

## 2024-02-06 ENCOUNTER — Ambulatory Visit (INDEPENDENT_AMBULATORY_CARE_PROVIDER_SITE_OTHER): Admitting: Otolaryngology

## 2024-02-06 ENCOUNTER — Other Ambulatory Visit: Payer: Self-pay | Admitting: Internal Medicine

## 2024-02-06 ENCOUNTER — Encounter (INDEPENDENT_AMBULATORY_CARE_PROVIDER_SITE_OTHER): Payer: Self-pay | Admitting: Otolaryngology

## 2024-02-06 ENCOUNTER — Other Ambulatory Visit: Payer: Self-pay

## 2024-02-06 VITALS — BP 118/69 | HR 96

## 2024-02-06 DIAGNOSIS — Z789 Other specified health status: Secondary | ICD-10-CM

## 2024-02-06 DIAGNOSIS — G4733 Obstructive sleep apnea (adult) (pediatric): Secondary | ICD-10-CM

## 2024-02-06 DIAGNOSIS — Z9889 Other specified postprocedural states: Secondary | ICD-10-CM

## 2024-02-06 MED ORDER — CITALOPRAM HYDROBROMIDE 40 MG PO TABS: 40.0000 mg | ORAL_TABLET | Freq: Every day | ORAL | 0 refills | Status: DC

## 2024-02-07 NOTE — Progress Notes (Signed)
 S/p Hypoglossal nerve stimulator placement on 01/27/2024 S: Doing well, pain fairly well controlled; no fevers or issue with wound. No tongue weakness, no lip weakness. No SOB. We discussed some submandibular prominence O: Tongue movement intact; no lower lip weakness; neck and chest incision c/d/I, no evidence of infection. Mild submandibular gland prominence, some edema, but no infection. A/P: 68 y.o. y.o. w/ OSA s/p Hypoglossal nerve stimulator placement on 01/27/2024 Doing well, no evidence of infection Recommend continued gentle neck rolls - her neck is very thin so would recommend this to avoid tethering Recommend vaseline x1 week, gentle massage neck and chest two weeks, and silicone scar cream in 3 weeks F/u a4 months for wound check

## 2024-02-20 ENCOUNTER — Telehealth: Payer: Self-pay | Admitting: General Practice

## 2024-02-20 NOTE — Telephone Encounter (Signed)
 Patient calling needing a refill on gabapentin  (NEURONTIN ) 600 MG tablet [[id number]] says it was denied on MyChart but patient got scheduled for an appointment with new NP Chelsa is wanting some to hold her off until then. [facility] in [place]. Thank you

## 2024-02-27 DIAGNOSIS — M7541 Impingement syndrome of right shoulder: Secondary | ICD-10-CM | POA: Diagnosis not present

## 2024-02-27 DIAGNOSIS — Z96611 Presence of right artificial shoulder joint: Secondary | ICD-10-CM | POA: Diagnosis not present

## 2024-03-05 ENCOUNTER — Encounter: Payer: Self-pay | Admitting: Nurse Practitioner

## 2024-03-14 ENCOUNTER — Ambulatory Visit (HOSPITAL_BASED_OUTPATIENT_CLINIC_OR_DEPARTMENT_OTHER): Admitting: Pulmonary Disease

## 2024-03-14 ENCOUNTER — Encounter (HOSPITAL_BASED_OUTPATIENT_CLINIC_OR_DEPARTMENT_OTHER): Payer: Self-pay | Admitting: Pulmonary Disease

## 2024-03-14 VITALS — BP 122/64 | HR 81 | Ht 69.0 in | Wt 168.0 lb

## 2024-03-14 DIAGNOSIS — G4733 Obstructive sleep apnea (adult) (pediatric): Secondary | ICD-10-CM

## 2024-03-14 DIAGNOSIS — Z87891 Personal history of nicotine dependence: Secondary | ICD-10-CM | POA: Diagnosis not present

## 2024-03-14 DIAGNOSIS — G2581 Restless legs syndrome: Secondary | ICD-10-CM

## 2024-03-14 DIAGNOSIS — Z9682 Presence of neurostimulator: Secondary | ICD-10-CM

## 2024-03-14 NOTE — Patient Instructions (Signed)
 Your device was activated today Start delay 50m Pause time 15 m Duration 8h  Level 1 = 0.5 V Increase by 1 level every week until tongue discomfort  Try to use every night  ALL night

## 2024-03-14 NOTE — Progress Notes (Signed)
 Subjective:    Patient ID: [name] [name], female    DOB: 1956-03-20, 68 y.o.   MRN: [id number]   68 yo ex-smoker for FU of COPD, OSA and restless leg syndrome.    RLS was diagnosed in her 30s - maintained on Requip  4 mg, before bedtime, gabapentin  600 mg up to 5 tablets a day .  She reports that gabapentin  makes her feel like a zombie in the daytime and has affected her balance.  She tried Neupro  patch but could not tolerate side effects. received iron infusions.  She tried mechanical compression device    OSA was diagnosed in the past, mild, oral appliance was tried which she did not tolerate she is finally settled down with a [employer] DreamWear fullface mask.  We reviewed sleep study.   She smoked about a pack per day for more than 20 years before she quit in 2013, more than 20 pack years.   She could not tolerate dental appliance or PAP therapy  Inspire implantation 01/27/24     Discussed the use of AI scribe software for clinical note transcription with the patient, who gave verbal consent to proceed.  History of Present Illness [name] [name] is a 68 year old female with obstructive sleep apnea who presents for activation of a hypoglossal nerve stimulator.  She experiences swelling at the site of the hypoglossal nerve stimulator, which is decreasing. She is hopeful for improvement in her sleep quality and restless legs. Her current medications include Requip  4 mg at bedtime and gabapentin  2400 mg daily. Her sleep schedule involves going to bed around 11 PM and waking up at 6 AM, with a brief interruption for a bathroom visit. She falls asleep within 5 to 30 minutes. She has been using Ozempic , resulting in weight loss since her last visit.   Drop 26 lbs  Significant tests/ events reviewed   LDCT 08/2020 -Previous nodules resolved, 4 mm nodule in the lingula     06/2021 >> NPSG >>AHI 11/h, predom supine, low sat 84% (220 lbs )  RBD noticed throughout study/sleep talking and  waking up confused. Significant leg and body jerks noticed during testing     01/2023 HST  AHI of 14, RDI of 27, oxygen nadir of 87% -Titrated to a CPAP and subsequently to a BiPAP of 20/16  PFTs May 24, 2023 showed mild airflow obstruction with FEV1 at 78%, ratio 66, FVC 91%, positive bronchodilator response, postbronchodilator FEV1 87%, ratio 74, FVC 91%, 12% bronchodilator change, DLCO 97%.   Review of Systems  neg for any significant sore throat, dysphagia, itching, sneezing, nasal congestion or excess/ purulent secretions, fever, chills, sweats, unintended wt loss, pleuritic or exertional cp, hempoptysis, orthopnea pnd or change in chronic leg swelling. Also denies presyncope, palpitations, heartburn, abdominal pain, nausea, vomiting, diarrhea or change in bowel or urinary habits, dysuria,hematuria, rash, arthralgias, visual complaints, headache, numbness weakness or ataxia.      Objective:   Physical Exam  Gen. Pleasant, well-nourished, in no distress ENT - no thrush, no pallor/icterus,no post nasal drip Neck: No JVD, no thyromegaly, no carotid bruits Lungs: no use of accessory muscles, no dullness to percussion, clear without rales or rhonchi  Cardiovascular: Rhythm regular, heart sounds  normal, no murmurs or gallops, no peripheral edema Musculoskeletal: No deformities, no cyanosis or clubbing        Assessment & Plan:   Assessment and Plan Assessment & Plan Obstructive Sleep Apnea (OSA) OSA with recent implantation of a hypoglossal nerve  stimulator, currently in the activation phase to determine optimal stimulation settings for symptom control. Full control of symptoms is expected over three to four months with gradual improvement. - Activate the hypoglossal nerve stimulator device. - Provide remote control for the device. - Start the device at a low stimulation level and gradually increase over the next few weeks. - Schedule a follow-up visit in one month to assess  progress and adjust settings. - Conduct a sleep study after two months to determine the optimal stimulation level. - Educate on using the remote control, including starting, pausing, and adjusting the device. - Advise to avoid eating or drinking while the device is active. - Set the device to operate for eight hours with a thirty-minute delay before activation.  Restless Legs Syndrome Restless Legs Syndrome managed with Requip  4 mg at bedtime and gabapentin  2400 mg daily. She is hopeful that the hypoglossal nerve stimulator will also help alleviate symptoms and improve sleep quality. - Continue Requip  4 mg at bedtime. - Continue gabapentin  2400 mg daily. - Monitor for improvement in restless legs symptoms with the use of the hypoglossal nerve stimulator.     Hypoglossal nerve stimulator was activated today.  Incision was checked, tongue protrusion was examined Programming : The activation workflow was followed 1.  Stimulation level : Sensation 0.5 V , functional level 0.5 V lower limit, upper limit 1.5 V 2.  Start delay 30 minutes, pause time 15 minutes, duration 8 hours 3.  Sensing waveform was analyzed for 3 minutes 4.  Sleep remote education was provided patient demonstrated competency with the remote and was aware of patient Instruction videos and sleep remote guide 5.  Patient was instructed to step up levels by 1 level (0.1 V ) every week 6.  Check-in visit in 1 month after activation visit to ensure that they are stepping up levels, using therapy  all night, every night and to evaluate subjective benefit.   7.  Inspire titration sleep study will be scheduled 3 months after the activation visit

## 2024-03-15 ENCOUNTER — Encounter: Payer: Self-pay | Admitting: Pulmonary Disease

## 2024-03-16 ENCOUNTER — Ambulatory Visit (HOSPITAL_BASED_OUTPATIENT_CLINIC_OR_DEPARTMENT_OTHER): Admitting: Pulmonary Disease

## 2024-03-16 ENCOUNTER — Other Ambulatory Visit: Payer: Self-pay

## 2024-03-27 ENCOUNTER — Encounter: Admitting: Nurse Practitioner

## 2024-04-12 ENCOUNTER — Encounter (HOSPITAL_BASED_OUTPATIENT_CLINIC_OR_DEPARTMENT_OTHER): Payer: Self-pay | Admitting: Pulmonary Disease

## 2024-04-12 ENCOUNTER — Ambulatory Visit (HOSPITAL_BASED_OUTPATIENT_CLINIC_OR_DEPARTMENT_OTHER): Admitting: Pulmonary Disease

## 2024-04-12 VITALS — BP 116/73 | HR 74 | Ht 69.0 in | Wt 170.2 lb

## 2024-04-12 DIAGNOSIS — Z9682 Presence of neurostimulator: Secondary | ICD-10-CM | POA: Diagnosis not present

## 2024-04-12 DIAGNOSIS — E663 Overweight: Secondary | ICD-10-CM | POA: Diagnosis not present

## 2024-04-12 DIAGNOSIS — G4733 Obstructive sleep apnea (adult) (pediatric): Secondary | ICD-10-CM

## 2024-04-12 NOTE — Patient Instructions (Signed)
  VISIT SUMMARY: Today, we discussed your progress following the nerve stimulator implantation for your sleep apnea. You mentioned that while you haven't noticed significant improvements in sleep quality or daytime fatigue, you have experienced fewer nighttime awakenings to use the bathroom. We also talked about your weight loss journey and its positive impact on managing your sleep apnea.  YOUR PLAN: -OBSTRUCTIVE SLEEP APNEA: Obstructive sleep apnea is a condition where the airway becomes blocked during sleep, causing breathing pauses. You are using an upper airway nerve stimulator to help manage this condition. Although you haven't noticed major changes in sleep quality or daytime fatigue, the reduction in nighttime awakenings is a positive sign. Continue to increase the stimulator level by one each week. We will conduct a sleep study in October to fine-tune the settings.  -OVERWEIGHT: Being overweight can contribute to obstructive sleep apnea. You are experiencing gradual weight loss, which is beneficial for managing your sleep apnea. Keep up the good work with your weight loss efforts.  INSTRUCTIONS: Please continue to increase the stimulator level by one each week. We will conduct a sleep study in October to fine-tune the settings. Keep monitoring your weight loss progress as it positively impacts your sleep apnea management.                      Contains text generated by [employer]bridge.                                 Contains text generated by [employer]bridge.

## 2024-04-12 NOTE — Progress Notes (Signed)
 Subjective:    Patient ID: [name] [name], female    DOB: 26-Apr-1956, 68 y.o.   MRN: [id number]   68 yo ex-smoker for FU of COPD, OSA and restless leg syndrome.    RLS was diagnosed in her 30s - maintained on Requip  4 mg, before bedtime, gabapentin  600 mg up to 5 tablets a day .  She reports that gabapentin  makes her feel like a zombie in the daytime and has affected her balance.  She tried Neupro  patch but could not tolerate side effects. received iron infusions.  She tried mechanical compression device    OSA was diagnosed in the past, mild, oral appliance was tried which she did not tolerate she is finally settled down with a [employer] DreamWear fullface mask.  We reviewed sleep study.   She smoked about a pack per day for more than 20 years before she quit in 2013, more than 20 pack years.    She could not tolerate dental appliance or PAP therapy  Inspire implantation 01/27/24 Activation 03/14/24 >> Sensation 0.5 V , functional level 0.5 V lower limit, upper limit 1.5 V  Start delay 30 minutes, pause time 15 minutes, duration 8 hours  Discussed the use of AI scribe software for clinical note transcription with the patient, who gave verbal consent to proceed.  History of Present Illness    History of Present Illness   [name] [name] is a 68 year old female with sleep apnea who presents for follow-up regarding her nerve stimulator implantation. She is accompanied by Damien, a known acquaintance.  Following the nerve stimulator implantation for sleep apnea, she is increasing the stimulation level weekly and is currently at level four. She has not noticed a significant improvement in sleep quality or daytime fatigue, expressing a feeling of laziness rather than tiredness. She does not nap during the day. Living alone, she lacks feedback on snoring but recalls significant snoring in the past. She uses the device consistently, with only one missed night. She notes a reduction in nighttime  awakenings to use the bathroom, which she views positively.        Incoming 0.8 V  Significant tests/ events reviewed   LDCT 08/2020 -Previous nodules resolved, 4 mm nodule in the lingula     06/2021 >> NPSG >>AHI 11/h, predom supine, low sat 84% (220 lbs )  RBD noticed throughout study/sleep talking and waking up confused. Significant leg and body jerks noticed during testing     01/2023 HST  AHI of 14, RDI of 27, oxygen nadir of 87% -Titrated to a CPAP and subsequently to a BiPAP of 20/16   PFTs May 24, 2023 showed mild airflow obstruction with FEV1 at 78%, ratio 66, FVC 91%, positive bronchodilator response, postbronchodilator FEV1 87%, ratio 74, FVC 91%, 12% bronchodilator change, DLCO 97%.   Review of Systems  neg for any significant sore throat, dysphagia, itching, sneezing, nasal congestion or excess/ purulent secretions, fever, chills, sweats, unintended wt loss, pleuritic or exertional cp, hempoptysis, orthopnea pnd or change in chronic leg swelling. Also denies presyncope, palpitations, heartburn, abdominal pain, nausea, vomiting, diarrhea or change in bowel or urinary habits, dysuria,hematuria, rash, arthralgias, visual complaints, headache, numbness weakness or ataxia.      Objective:   Physical Exam  Gen. Pleasant, well-nourished, in no distress ENT - no thrush, no pallor/icterus,no post nasal drip Neck: No JVD, no thyromegaly, no carotid bruits Lungs: no use of accessory muscles, no dullness to percussion, clear without rales or rhonchi  Cardiovascular: Rhythm regular, heart sounds  normal, no murmurs or gallops, no peripheral edema Musculoskeletal: No deformities, no cyanosis or clubbing        Assessment & Plan:   Assessment and Plan Assessment & Plan  Assessment and Plan    Obstructive sleep apnea, status post upper airway nerve stimulator implantation Obstructive sleep apnea is managed with an upper airway nerve stimulator, currently titrated to a  lower level. She reports no significant changes in sleep quality or daytime fatigue but notes reduced nocturnal awakenings to urinate, indicating some improvement. The stimulator is well-tolerated with good tongue motion at higher levels. - Increase stimulator level by one each week until readiness check visit in 4 weeks - Conduct sleep study in October to fine-tune settings.  Overweight She is overweight but experiencing gradual weight loss, which is expected to positively impact the management of obstructive sleep apnea.

## 2024-04-17 ENCOUNTER — Other Ambulatory Visit: Payer: Self-pay

## 2024-04-24 NOTE — Telephone Encounter (Unsigned)
 Copied from CRM [id number]. Topic: Clinical - Medication Refill >> Apr 24, 2024  4:53 PM Jasmin G wrote: Medication: gabapentin  (NEURONTIN ) 600 MG tablet  Has the patient contacted their pharmacy? No (Agent: If no, request that the patient contact the pharmacy for the refill. If patient does not wish to contact the pharmacy document the reason why and proceed with request.) (Agent: If yes, when and what did the pharmacy advise?)  This is the patient's preferred pharmacy:  [facility] [phone] - [place], [place] - [address] [facility] [address] [place] [address] Phone: [phone] Fax: [phone]  Is this the correct pharmacy for this prescription? Yes If no, delete pharmacy and type the correct one.   Has the prescription been filled recently? Yes  Is the patient out of the medication? No  Has the patient been seen for an appointment in the last year OR does the patient have an upcoming appointment? Yes  Can we respond through MyChart? No  Agent: Please be advised that Rx refills may take up to 3 business days. We ask that you follow-up with your pharmacy.

## 2024-04-25 ENCOUNTER — Other Ambulatory Visit: Payer: Self-pay | Admitting: Medical Genetics

## 2024-04-30 ENCOUNTER — Other Ambulatory Visit (HOSPITAL_COMMUNITY): Payer: Self-pay

## 2024-04-30 DIAGNOSIS — Z1231 Encounter for screening mammogram for malignant neoplasm of breast: Secondary | ICD-10-CM

## 2024-05-04 ENCOUNTER — Other Ambulatory Visit: Payer: Self-pay | Admitting: Internal Medicine

## 2024-05-04 ENCOUNTER — Other Ambulatory Visit: Payer: Self-pay

## 2024-05-04 DIAGNOSIS — G2581 Restless legs syndrome: Secondary | ICD-10-CM

## 2024-05-04 NOTE — Telephone Encounter (Signed)
 Copied from CRM #[id number]. Topic: Clinical - Medication Refill >> May 04, 2024 11:50 AM [name] [name] wrote: Medication: gabapentin  (NEURONTIN ) 600 MG tablet; tramadol  100mg    Has the patient contacted their pharmacy? Yes, patient needs an appointment but she has an appointment scheduled for October 13th, 2025. Patient is completely out of the prescription. If the prescriptions can't get filled, patient is asking for a phone call to be made aware.   This is the patient's preferred pharmacy:  [facility] [phone] - [place], [place] - [address] AT [facility] [address] [place] [address] Phone: [phone] Fax: [phone]  Is this the correct pharmacy for this prescription? Yes  If no, delete pharmacy and type the correct one.   Has the prescription been filled recently? No  Is the patient out of the medication? Yes, and she is concerned because she put in refill request last week and does not want to be in pain due to RLS.   Has the patient been seen for an appointment in the last year OR does the patient have an upcoming appointment? Yes, October 13th 2025   Can we respond through MyChart? Patient prefers phone call   Agent: Please be advised that Rx refills may take up to 3 business days. We ask that you follow-up with your pharmacy.

## 2024-05-05 ENCOUNTER — Other Ambulatory Visit: Payer: Self-pay | Admitting: Internal Medicine

## 2024-05-07 ENCOUNTER — Other Ambulatory Visit: Payer: Self-pay

## 2024-05-07 DIAGNOSIS — G2581 Restless legs syndrome: Secondary | ICD-10-CM

## 2024-05-07 MED ORDER — TRAMADOL HCL ER 100 MG PO TB24: 100.0000 mg | ORAL_TABLET | Freq: Every day | ORAL | 0 refills | Status: DC

## 2024-05-07 MED ORDER — GABAPENTIN 600 MG PO TABS: ORAL_TABLET | ORAL | 0 refills | Status: DC

## 2024-05-10 ENCOUNTER — Other Ambulatory Visit (HOSPITAL_COMMUNITY)
Admission: RE | Admit: 2024-05-10 | Discharge: 2024-05-10 | Disposition: A | Discharge status: Home / Self Care | Source: Ambulatory Visit | Attending: Oncology | Admitting: Oncology

## 2024-05-10 ENCOUNTER — Ambulatory Visit (HOSPITAL_COMMUNITY)
Admission: RE | Admit: 2024-05-10 | Discharge: 2024-05-10 | Disposition: A | Discharge status: Home / Self Care | Source: Ambulatory Visit

## 2024-05-10 DIAGNOSIS — Z1231 Encounter for screening mammogram for malignant neoplasm of breast: Secondary | ICD-10-CM | POA: Diagnosis not present

## 2024-05-14 ENCOUNTER — Ambulatory Visit (HOSPITAL_BASED_OUTPATIENT_CLINIC_OR_DEPARTMENT_OTHER): Admitting: Pulmonary Disease

## 2024-05-14 ENCOUNTER — Encounter (HOSPITAL_BASED_OUTPATIENT_CLINIC_OR_DEPARTMENT_OTHER): Payer: Self-pay | Admitting: Pulmonary Disease

## 2024-05-14 ENCOUNTER — Other Ambulatory Visit (HOSPITAL_COMMUNITY): Payer: Self-pay

## 2024-05-14 VITALS — BP 116/63 | HR 81 | Ht 69.0 in | Wt 170.8 lb

## 2024-05-14 DIAGNOSIS — R928 Other abnormal and inconclusive findings on diagnostic imaging of breast: Secondary | ICD-10-CM

## 2024-05-14 DIAGNOSIS — G4733 Obstructive sleep apnea (adult) (pediatric): Secondary | ICD-10-CM | POA: Diagnosis not present

## 2024-05-14 DIAGNOSIS — Z9682 Presence of neurostimulator: Secondary | ICD-10-CM

## 2024-05-14 DIAGNOSIS — Z4542 Encounter for adjustment and management of neuropacemaker (brain) (peripheral nerve) (spinal cord): Secondary | ICD-10-CM | POA: Diagnosis not present

## 2024-05-14 NOTE — Progress Notes (Signed)
 Subjective:    Patient ID: [name] [name], female    DOB: [birth year], 68 y.o.   MRN: [id number]     69 yo ex-smoker for FU of COPD, OSA and restless leg syndrome.    RLS was diagnosed in her 30s - maintained on Requip  4 mg, before bedtime, gabapentin  600 mg up to 5 tablets a day .  She reports that gabapentin  makes her feel like a zombie in the daytime and has affected her balance.  She tried Neupro  patch but could not tolerate side effects. received iron infusions.  She tried mechanical compression device    OSA was diagnosed in the past, mild, oral appliance was tried which she did not tolerate she is finally settled down with a [employer] DreamWear fullface mask.  We reviewed sleep study.   She smoked about a pack per day for more than 20 years before she quit in 2013, more than 20 pack years.    She could not tolerate dental appliance or PAP therapy  Inspire implantation 01/27/24 Activation 03/14/24 >> Sensation 0.5 V , functional level 0.5 V lower limit, upper limit 1.5 V  Start delay 30 minutes, pause time 15 minutes, duration 8 hours  Post activation 03/2024 >> Incoming 0.8 V   Discussed the use of AI scribe software for clinical note transcription with the patient, who gave verbal consent to proceed.  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  She adjusts the nerve stimulator to 1.0 volts but missed a week of adjustments. The device activates 30 minutes after being turned on, often during sleep, which may affect her perception of its effectiveness. She notes no significant symptom changes but believes she is waking less frequently at night. She has restless legs but experiences no discomfort with current stimulation levels. She occasionally falls asleep in her recliner, which may lead to not activating the device some nights.   DL >> 22% usage , few missed  nights, incoming 1.0V  Significant tests/ events reviewed   LDCT 08/2020 -Previous nodules resolved, 4 mm nodule in the lingula     06/2021 >> NPSG >>AHI 11/h, predom supine, low sat 84% (220 lbs )  RBD noticed throughout study/sleep talking and waking up confused. Significant leg and body jerks noticed during testing     01/2023 HST  AHI of 14, RDI of 27, oxygen nadir of 87% -Titrated to a CPAP and subsequently to a BiPAP of 20/16   PFTs May 24, 2023 showed mild airflow obstruction with FEV1 at 78%, ratio 66, FVC 91%, positive bronchodilator response, postbronchodilator FEV1 87%, ratio 74, FVC 91%, 12% bronchodilator change, DLCO 97%  Review of Systems  neg for any significant sore throat, dysphagia, itching, sneezing, nasal congestion or excess/ purulent secretions, fever, chills, sweats, unintended wt loss, pleuritic or exertional cp, hempoptysis, orthopnea pnd or change in chronic leg swelling. Also denies presyncope, palpitations, heartburn, abdominal pain, nausea, vomiting, diarrhea or change in bowel or urinary habits, dysuria,hematuria, rash, arthralgias, visual complaints, headache, numbness weakness or ataxia.      Objective:   Physical Exam  Gen. Pleasant, obese, in no distress ENT - no lesions, no post nasal drip Neck: No JVD, no thyromegaly, no carotid bruits Lungs: no use of accessory muscles, no dullness to percussion, decreased without rales or rhonchi  Cardiovascular: Rhythm regular, heart sounds  normal, no murmurs or gallops, no  peripheral edema Musculoskeletal: No deformities, no cyanosis or clubbing , no tremors       Assessment & Plan:   Assessment and Plan Assessment & Plan Obstructive sleep apnea status post upper airway stimulation implant Obstructive sleep apnea managed with an upper airway stimulation implant. She reports not feeling the stimulation, possibly due to being asleep when it activates. She has not noticed significant changes in her sleep  pattern, but reports less bed disturbance and fewer awakenings at night. Stimulation was Studied with programmer The device was increased from 1.0 to 1.1 volts today, with good tongue movement observed at 1.2 volts. She has been advised to increase the stimulation level weekly, but missed a week due to a misunderstanding. She is scheduled for a sleep study in three weeks. - Increase stimulation level weekly until the sleep study. - Conduct sleep study in three weeks. - Instruct her to update the Inspire app at least once a week to ensure data synchronization. - Advise her to return if stimulation feels too strong.

## 2024-05-14 NOTE — Patient Instructions (Signed)
 GOod luck with your titration study  Your device was  programmed to   Level 7 = 1.1 V Increase by 1 level every week until tongue discomfort  Try to use every night

## 2024-05-17 ENCOUNTER — Ambulatory Visit (HOSPITAL_COMMUNITY)
Admission: RE | Admit: 2024-05-17 | Discharge: 2024-05-17 | Disposition: A | Discharge status: Home / Self Care | Source: Ambulatory Visit

## 2024-05-17 ENCOUNTER — Encounter (HOSPITAL_COMMUNITY): Payer: Self-pay

## 2024-05-17 ENCOUNTER — Other Ambulatory Visit: Payer: Self-pay | Admitting: Internal Medicine

## 2024-05-17 DIAGNOSIS — R928 Other abnormal and inconclusive findings on diagnostic imaging of breast: Secondary | ICD-10-CM

## 2024-05-17 DIAGNOSIS — R92322 Mammographic fibroglandular density, left breast: Secondary | ICD-10-CM | POA: Diagnosis not present

## 2024-05-18 LAB — GENECONNECT MOLECULAR SCREEN: Genetic Analysis Overall Interpretation: NEGATIVE

## 2024-05-29 ENCOUNTER — Ambulatory Visit: Payer: Self-pay

## 2024-06-04 ENCOUNTER — Ambulatory Visit (HOSPITAL_BASED_OUTPATIENT_CLINIC_OR_DEPARTMENT_OTHER): Attending: Pulmonary Disease | Admitting: Pulmonary Disease

## 2024-06-04 ENCOUNTER — Ambulatory Visit: Payer: Self-pay

## 2024-06-04 VITALS — BP 107/64 | HR 75 | Resp 16 | Ht 67.5 in | Wt 175.0 lb

## 2024-06-04 DIAGNOSIS — Z23 Encounter for immunization: Secondary | ICD-10-CM

## 2024-06-04 DIAGNOSIS — G4733 Obstructive sleep apnea (adult) (pediatric): Secondary | ICD-10-CM | POA: Diagnosis not present

## 2024-06-04 DIAGNOSIS — Z9682 Presence of neurostimulator: Secondary | ICD-10-CM | POA: Insufficient documentation

## 2024-06-04 DIAGNOSIS — G2581 Restless legs syndrome: Secondary | ICD-10-CM

## 2024-06-04 DIAGNOSIS — E785 Hyperlipidemia, unspecified: Secondary | ICD-10-CM

## 2024-06-04 DIAGNOSIS — K219 Gastro-esophageal reflux disease without esophagitis: Secondary | ICD-10-CM

## 2024-06-04 MED ORDER — COVID-19 MRNA VACC (MODERNA) 50 MCG/0.5ML IM SUSY: 0.5000 mL | PREFILLED_SYRINGE | Freq: Once | INTRAMUSCULAR | 0 refills | Status: AC

## 2024-06-04 NOTE — Progress Notes (Signed)
 Established Patient Office Visit  Subjective   Patient ID: [name] [name], female    DOB: 1956/04/30  Age: 68 y.o. MRN: [id number]  Chief Complaint  Patient presents with   Follow-up    Wants to discuss the trelegy and would like to discuss coming off of several meds (requip , omeprazole , and gabapentin )    HPI Discussed the use of AI scribe software for clinical note transcription with the patient, who gave verbal consent to proceed.  History of Present Illness   [name] [name] is a 68 year old female with COPD and restless leg syndrome who presents for medication management and follow-up.  Dyspnea and chronic obstructive pulmonary disease (copd) - Stopped taking Trelegy after a recent breathing test showed good results - Questions the accuracy of her COPD diagnosis, as it was made during an acute illness - Seeks guidance regarding the necessity of ongoing COPD management  Restless leg syndrome and associated symptoms - Restless leg syndrome since her mid-thirties, described as severe and causing significant sleep deprivation and distress - Ropinirole  is no longer effective and she wishes to discontinue it - Gabapentin  provides symptom relief but causes difficulty walking in a straight line, leading to embarrassment in social situations - Opioids, particularly oxycodone  after surgeries, have been effective for symptoms in the past, but she is hesitant to discuss opioid use due to concerns about being perceived as a drug seeker - Occasionally uses tramadol  for symptoms but limits use due to concerns about dependency and cognitive fogginess - Symptoms are severe enough to prompt walking in her neighbor's grass at night for relief  Medication concerns and adverse effects - Considering discontinuing omeprazole  and her cholesterol medication due to concerns about long-term benefit - Currently on a low dose of cholesterol medication  Sleep disturbance and sleep apnea - Scheduled for  a sleep study tonight to evaluate the effectiveness of her Inspire device for sleep apnea      Patient Active Problem List   Diagnosis Date Noted   Intolerance of continuous positive airway pressure (CPAP) ventilation 01/27/2024   GERD (gastroesophageal reflux disease) 04/26/2023   Breast cancer screening 04/26/2023   Osteoporosis screening 04/26/2023   Need for influenza vaccination 04/26/2023   Diarrhea due to malabsorption 12/10/2022   Iron deficiency 08/30/2022   Positive colorectal cancer screening using Cologuard test 08/05/2022   OSA (obstructive sleep apnea) 06/21/2022   Screening for colorectal cancer 06/14/2022   Left elbow pain 05/14/2022   COPD (chronic obstructive pulmonary disease) (HCC) 04/13/2022   Hyperlipidemia 04/13/2022   Intolerance to fatty food 03/16/2022   Nausea & vomiting 03/16/2022   Vitamin B 12 deficiency 01/11/2022   Anemia 01/11/2022   Hyperreflexia 10/09/2021   Sensorineural hearing loss (SNHL) of both ears 10/09/2021   Anxiety 08/11/2021   Acquired deformity of joint of finger of right hand 06/16/2021   Acquired deformity of joint of finger of left hand 06/16/2021   Hx of non anemic vitamin B12 deficiency 06/16/2021   Restless leg 06/16/2021   Vitamin D  deficiency 03/15/2021   Preventative health care 03/15/2021   Hx of sleep apnea 03/15/2021   Magnesium deficiency 03/15/2021    ROS    Objective:     BP 107/64   Pulse 75   Resp 16   Ht 5' 7.5 (1.715 m)   Wt 175 lb (79.4 kg)   SpO2 94%   BMI 27.00 kg/m  BP Readings from Last 3 Encounters:  06/04/24 107/64  05/14/24 116/63  04/12/24 116/73   Wt Readings from Last 3 Encounters:  06/04/24 175 lb (79.4 kg)  06/04/24 175 lb (79.4 kg)  05/14/24 170 lb 12.8 oz (77.5 kg)     Physical Exam Vitals and nursing note reviewed.  Constitutional:      Appearance: Normal appearance.  HENT:     Head: Normocephalic.     Right Ear: Tympanic membrane, ear canal and external ear normal.      Left Ear: Tympanic membrane, ear canal and external ear normal.     Nose: Nose normal.     Mouth/Throat:     Mouth: Mucous membranes are moist.     Pharynx: Oropharynx is clear.  Eyes:     Extraocular Movements: Extraocular movements intact.     Pupils: Pupils are equal, round, and reactive to light.  Cardiovascular:     Rate and Rhythm: Normal rate and regular rhythm.  Pulmonary:     Effort: Pulmonary effort is normal.     Breath sounds: Normal breath sounds.  Musculoskeletal:     Cervical back: Normal range of motion and neck supple.  Skin:    General: Skin is warm and dry.  Neurological:     Mental Status: She is alert and oriented to person, place, and time.  Psychiatric:        Mood and Affect: Mood normal.        Thought Content: Thought content normal.    No results found for any visits on 06/04/24.    The 10-year ASCVD risk score (Arnett DK, et al., 2019) is: 4.2%    Assessment & Plan:   Assessment and Plan    Adult Wellness Visit Routine follow-up for care establishment and medication management discussion. - Order lab tests and follow-up in 4-6 weeks to reassess cholesterol levels after stopping Crestor . - Administer flu vaccine.  Restless legs syndrome Chronic condition managed with ropinirole , gabapentin , and tramadol . Gabapentin  causes imbalance and cognitive effects. Current treatment inadequate. Discussed [facility] protocol and Lyrica as alternative. Patient hesitant to change without further review. - Review [facility] guidelines for restless legs syndrome treatment. - Consider trial of Lyrica if appropriate after review. - Taper off gabapentin  and ropinirole  if transitioning to new treatment.  Obstructive sleep apnea, status post Inspire device Post-Inspire device implantation. Reports device effect on tongue. Undergoing first sleep study post-implantation tonight.  Hyperlipidemia Interested in discontinuing Crestor . No personal cardiovascular  disease history, but family history of heart disease. Plan to reassess lipid levels post-medication cessation. - Stop Crestor . - Order lab tests and follow-up in 4-6 weeks to reassess cholesterol levels.  Gastroesophageal reflux disease Currently on omeprazole . Interested in discontinuing due to long-term use concerns. Discussed Pepcid as alternative. - Stop omeprazole . - Use Pepcid as needed for symptomatic relief.        Return in about 6 weeks (around 07/16/2024), or recheck labs.    Leita Longs, FNP

## 2024-06-06 ENCOUNTER — Encounter (INDEPENDENT_AMBULATORY_CARE_PROVIDER_SITE_OTHER): Payer: Self-pay | Admitting: Gastroenterology

## 2024-06-08 NOTE — Procedures (Signed)
 [facility] [address] [place], [place] [address] Tel: [phone]   Fax: [phone]  Inspire Interpretation  Patient Name:  [name] [name], [name] [name] Date:  06/04/2024 Referring Physician:  HARDEN Navea Woodrow [phone]) %%startinterp%% Indications for Polysomnography The patient is a 68 year old Female who is 5' 7 and weighs 175.0 lbs. Her BMI equals 27.1.  A full night inspire treatment study was performed.   Polysomnogram Data A full night polysomnogram recorded the standard physiologic parameters including EEG, EOG, EMG, EKG, nasal and oral airflow.  Respiratory parameters of chest and abdominal movements were recorded with Respiratory Inductance Plethysmography belts.  Oxygen saturation was recorded by pulse oximetry.   Sleep Architecture The total recording time of the polysomnogram was 433.8 minutes.  The total sleep time was 250.0 minutes.  The patient spent 25.4% of total sleep time in Stage N1, 58.2% in Stage N2, 2.0% in Stages N3, and 14.4% in REM.  Sleep latency was 53.2 minutes.  REM latency was 335.5 minutes.  Sleep Efficiency was 57.6%.  Wake after Sleep Onset time was 130.5 minutes.  Inspire Summary The patient was on inspire therapy at levels ranging from 1.2* volts up to 2* volts.  The last level used in the study was 2* volts.  Respiratory Events The polysomnogram revealed a presence of - obstructive, - central, and - mixed apneas resulting in an Apnea index of - events per hour.  There were 82 hypopneas (>=3% desaturation and/or arousal) resulting in an Apnea\Hypopnea Index (AHI >=3% desaturation and/or arousal) of 19.7 events per hour.  There were 35 hypopneas (>=4% desaturation) resulting in an Apnea\Hypopnea Index (AHI >=4% desaturation) of 8.4 events per hour.  There were 40 Respiratory Effort Related Arousals resulting in a RERA index of 9.6 events per hour. The Respiratory Disturbance Index is 29.3 events per hour.  The snore index was 307.0  events per hour.  Mean oxygen saturation was 93.1%.  The lowest oxygen saturation during sleep was 86.0%.  Time spent <=88% oxygen saturation was 2.9 minutes (0.7%).  Limb Activity There were - limb movements recorded.  Of this total, - were classified as PLMs.  Of the PLMs, - were associated with arousals.  The Limb Movement index was - per hour while the PLM index was - per hour.  Cardiac Summary The average pulse rate was 72.4 bpm.  The minimum pulse rate was 60.0 bpm while the maximum pulse rate was 93.0 bpm.  Cardiac rhythm was normal/abnormal.  Comments: Incoming amplitude was 1.4V. During this study, she was titrated between [facility] setting of 1.2 volts and 2.0 volts.  Diagnosis: OSA s/p hypoglossal nerve stimulator device  Recommendations: Therapeutic amplitudes were achieved at 1.6V & above & possibly at 1.4V. If unable to tolerate this amplitude, she should possibly be studied at lower amplitudes o understand efficacy at lower voltage levels   This study was personally reviewed and electronically signed by: Dr. HARDEN Staff Accredited Board Certified in Sleep Medicine

## 2024-06-09 NOTE — Assessment & Plan Note (Signed)
 Obstructive sleep apnea, status post Inspire device Post-Inspire device implantation. Reports device effect on tongue. Undergoing first sleep study post-implantation tonight.

## 2024-06-09 NOTE — Assessment & Plan Note (Signed)
 Currently on omeprazole . Interested in discontinuing due to long-term use concerns. Discussed Pepcid as alternative. - Stop omeprazole . - Use Pepcid as needed for symptomatic relief.

## 2024-06-09 NOTE — Assessment & Plan Note (Signed)
 Interested in discontinuing Crestor . No personal cardiovascular disease history, but family history of heart disease. Plan to reassess lipid levels post-medication cessation. - Stop Crestor . - Order lab tests and follow-up in 4-6 weeks to reassess cholesterol levels

## 2024-06-09 NOTE — Assessment & Plan Note (Addendum)
 Chronic condition managed with ropinirole , gabapentin , and tramadol . Gabapentin  causes imbalance and cognitive effects. Current treatment inadequate. Discussed [facility] protocol and Lyrica as alternative. Patient hesitant to change without further review. - Review [facility] guidelines for restless legs syndrome treatment, which recommends opioid tx for refractory RLS.  - Consider trial of Lyrica if appropriate after review. - Taper off gabapentin  and ropinirole  if transitioning to new treatment.

## 2024-06-25 ENCOUNTER — Encounter: Payer: Self-pay | Admitting: Radiology

## 2024-07-02 ENCOUNTER — Encounter (HOSPITAL_BASED_OUTPATIENT_CLINIC_OR_DEPARTMENT_OTHER): Payer: Self-pay

## 2024-07-02 ENCOUNTER — Ambulatory Visit (HOSPITAL_BASED_OUTPATIENT_CLINIC_OR_DEPARTMENT_OTHER): Admitting: Pulmonary Disease

## 2024-07-10 ENCOUNTER — Other Ambulatory Visit: Payer: Self-pay

## 2024-07-10 DIAGNOSIS — G2581 Restless legs syndrome: Secondary | ICD-10-CM

## 2024-07-11 ENCOUNTER — Ambulatory Visit (INDEPENDENT_AMBULATORY_CARE_PROVIDER_SITE_OTHER): Admitting: Otolaryngology

## 2024-08-20 ENCOUNTER — Other Ambulatory Visit: Payer: Self-pay

## 2024-08-20 DIAGNOSIS — G2581 Restless legs syndrome: Secondary | ICD-10-CM

## 2024-08-31 ENCOUNTER — Other Ambulatory Visit: Payer: Self-pay | Admitting: Internal Medicine

## 2024-09-05 ENCOUNTER — Other Ambulatory Visit: Payer: Self-pay | Admitting: Pulmonary Disease

## 2024-09-05 NOTE — Telephone Encounter (Signed)
 Left pt message to find out if she is still taking the Trelegy as it is marked as not taking at a recent visit

## 2024-09-06 ENCOUNTER — Telehealth: Payer: Self-pay

## 2024-09-06 NOTE — Telephone Encounter (Signed)
 Copied from CRM [id number]. Topic: Clinical - Medical Advice >> Sep 05, 2024  2:00 PM Joesph PARAS wrote: Reason for CRM: Patient states she is not taking Trelegy but would like to start taking it again.  Dr Jude, is this appropriate?

## 2024-09-06 NOTE — Telephone Encounter (Signed)
 Atc x1 lmtcb

## 2024-09-06 NOTE — Telephone Encounter (Signed)
 Refill sent to pharmacy.

## 2024-09-07 NOTE — Telephone Encounter (Signed)
 ATC X2. LMTCB

## 2024-09-12 ENCOUNTER — Other Ambulatory Visit: Payer: Self-pay

## 2024-09-12 DIAGNOSIS — G2581 Restless legs syndrome: Secondary | ICD-10-CM

## 2024-09-20 ENCOUNTER — Encounter (HOSPITAL_BASED_OUTPATIENT_CLINIC_OR_DEPARTMENT_OTHER): Payer: Self-pay

## 2024-09-20 ENCOUNTER — Ambulatory Visit (HOSPITAL_BASED_OUTPATIENT_CLINIC_OR_DEPARTMENT_OTHER): Admitting: Pulmonary Disease
